# Patient Record
Sex: Male | Born: 1985 | Race: Black or African American | Hispanic: No | Marital: Single | Smoking: Former smoker
Health system: Southern US, Community
[De-identification: ages and names within clinical notes are randomized; demographics above are authoritative.]

## PROBLEM LIST (undated history)

## (undated) DIAGNOSIS — J302 Other seasonal allergic rhinitis: Secondary | ICD-10-CM

## (undated) DIAGNOSIS — D649 Anemia, unspecified: Secondary | ICD-10-CM

## (undated) DIAGNOSIS — B353 Tinea pedis: Secondary | ICD-10-CM

## (undated) DIAGNOSIS — M2550 Pain in unspecified joint: Secondary | ICD-10-CM

## (undated) HISTORY — DX: Tinea pedis: B35.3

## (undated) HISTORY — DX: Anemia, unspecified: D64.9

## (undated) HISTORY — DX: Pain in unspecified joint: M25.50

## (undated) HISTORY — DX: Other seasonal allergic rhinitis: J30.2

---
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
## 2008-11-20 ENCOUNTER — Ambulatory Visit: Payer: Self-pay | Admitting: Internal Medicine

## 2008-11-20 DIAGNOSIS — L4050 Arthropathic psoriasis, unspecified: Secondary | ICD-10-CM | POA: Insufficient documentation

## 2008-11-20 DIAGNOSIS — E7800 Pure hypercholesterolemia, unspecified: Secondary | ICD-10-CM | POA: Insufficient documentation

## 2008-11-20 DIAGNOSIS — R718 Other abnormality of red blood cells: Secondary | ICD-10-CM | POA: Insufficient documentation

## 2008-11-23 ENCOUNTER — Telehealth (INDEPENDENT_AMBULATORY_CARE_PROVIDER_SITE_OTHER): Payer: Self-pay | Admitting: *Deleted

## 2008-12-01 LAB — CONVERTED CEMR LAB
AST: 31 units/L (ref 0–37)
Alkaline Phosphatase: 67 units/L (ref 39–117)
Basophils Relative: 0.4 % (ref 0.0–3.0)
Bilirubin Urine: NEGATIVE
CO2: 30 meq/L (ref 19–32)
Calcium: 9.3 mg/dL (ref 8.4–10.5)
Chloride: 105 meq/L (ref 96–112)
Eosinophils Absolute: 0.2 10*3/uL (ref 0.0–0.7)
Eosinophils Relative: 2.2 % (ref 0.0–5.0)
Glucose, Bld: 100 mg/dL — ABNORMAL HIGH (ref 70–99)
HDL: 31 mg/dL — ABNORMAL LOW (ref >39.00)
Ketones, ur: NEGATIVE mg/dL
Leukocytes, UA: NEGATIVE
Lymphocytes Relative: 26.4 % (ref 12.0–46.0)
Monocytes Absolute: 0.5 10*3/uL (ref 0.1–1.0)
Monocytes Relative: 5.4 % (ref 3.0–12.0)
Neutro Abs: 5.6 10*3/uL (ref 1.4–7.7)
Neutrophils Relative %: 65.6 % (ref 43.0–77.0)
Potassium: 3.8 meq/L (ref 3.5–5.1)
Total CHOL/HDL Ratio: 6
Urine Glucose: NEGATIVE mg/dL
Urobilinogen, UA: 0.2 (ref 0.0–1.0)
VLDL: 21.8 mg/dL (ref 0.0–40.0)
WBC: 8.5 10*3/uL (ref 4.5–10.5)
pH: 6 (ref 5.0–8.0)

## 2009-10-01 ENCOUNTER — Encounter: Payer: Self-pay | Admitting: Adult Health

## 2009-10-01 ENCOUNTER — Ambulatory Visit: Payer: Self-pay | Admitting: Internal Medicine

## 2009-10-01 LAB — CONVERTED CEMR LAB: Anti Nuclear Antibody(ANA): NEGATIVE

## 2009-10-05 DIAGNOSIS — B353 Tinea pedis: Secondary | ICD-10-CM | POA: Insufficient documentation

## 2009-10-06 ENCOUNTER — Telehealth (INDEPENDENT_AMBULATORY_CARE_PROVIDER_SITE_OTHER): Payer: Self-pay | Admitting: *Deleted

## 2009-10-06 LAB — CONVERTED CEMR LAB
Basophils Absolute: 0 10*3/uL (ref 0.0–0.1)
Eosinophils Absolute: 0.2 10*3/uL (ref 0.0–0.7)
HCT: 38.3 % — ABNORMAL LOW (ref 39.0–52.0)
Lymphs Abs: 2.1 10*3/uL (ref 0.7–4.0)
MCHC: 33.3 g/dL (ref 30.0–36.0)
Neutro Abs: 6.6 10*3/uL (ref 1.4–7.7)
RBC: 5.26 M/uL (ref 4.22–5.81)
Rhuematoid fact SerPl-aCnc: 20 intl units/mL (ref 0.0–20.0)

## 2009-10-21 ENCOUNTER — Telehealth: Payer: Self-pay | Admitting: Adult Health

## 2009-10-22 ENCOUNTER — Ambulatory Visit: Payer: Self-pay | Admitting: Internal Medicine

## 2009-10-22 ENCOUNTER — Ambulatory Visit: Payer: Self-pay | Admitting: Adult Health

## 2009-10-22 LAB — CONVERTED CEMR LAB
Folate: 6.8 ng/mL
Iron: 60 ug/dL (ref 42–165)
Saturation Ratios: 16.9 % — ABNORMAL LOW (ref 20.0–50.0)
Transferrin: 253.4 mg/dL (ref 212.0–360.0)
Vitamin B-12: 390 pg/mL (ref 211–911)

## 2009-11-02 ENCOUNTER — Ambulatory Visit: Payer: Self-pay | Admitting: Internal Medicine

## 2009-12-10 LAB — CONVERTED CEMR LAB: OCCULT 2: NEGATIVE

## 2010-01-18 ENCOUNTER — Telehealth: Payer: Self-pay | Admitting: Internal Medicine

## 2010-05-13 ENCOUNTER — Emergency Department (HOSPITAL_COMMUNITY)
Admission: EM | Admit: 2010-05-13 | Discharge: 2010-05-13 | Payer: Self-pay | Source: Home / Self Care | Admitting: Emergency Medicine

## 2010-05-16 ENCOUNTER — Emergency Department (HOSPITAL_COMMUNITY)
Admission: EM | Admit: 2010-05-16 | Discharge: 2010-05-16 | Payer: Self-pay | Source: Home / Self Care | Admitting: Emergency Medicine

## 2010-07-05 NOTE — Progress Notes (Signed)
Summary: lab results  Phone Note Call from Patient Call back at [phone]   Caller: Patient Call For: parrett Summary of Call: Wants lab results. Initial call taken by: Darletta Moll,  Oct 06, 2009 2:55 PM  Follow-up for Phone Call        per 10-06-09 append for 10-01-09 labs, pt aware. Follow-up by: Boone Master CNA,  Oct 06, 2009 3:10 PM

## 2010-07-05 NOTE — Assessment & Plan Note (Signed)
Summary: Acute NP office visit - edema   Primary Provider/Referring Provider:  Sherene Sires  CC:  edema in both feet and and index finger on right hand x3weeks - denies any changes in breathing.  History of Present Illness: 25 yobm with h/o seasonal allergies controlled with otc's just in spring with pollen.  November 20, 2008 for CPX no reg exercise, no limitations, no active allergy complaints. Pt denies any significant sore throat, nasal congestion or excess secretions, fever, chills, sweats, unintended wt loss, pleuritic or exertional cp, orthopnea pnd or leg swelling.    October 01, 2009--Presents for an acute office visit. Complains of edema in both feet, and index finger on right hand x3weeks. Over last 3 weeks noticed cracked skin along feet and toes-esp 2nd right toe. Also pain in feet -plantar surface. Has on/off stiffness swelling in right 2nd finger. No known injury. Denies chest pain, dyspnea, orthopnea, hemoptysis, fever, n/v/d, edema, headache., rash, urinary symptoms, known injury.   Preventive Screening-Counseling & Management  Alcohol-Tobacco     Smoking Status: never  Medications Prior to Update: 1)  None  Current Medications (verified): 1)  Ibuprofen 800 Mg Tabs (Ibuprofen) .... Take 1 Tablet By Mouth Three Times A Day With Food X7days  Allergies (verified): No Known Drug Allergies  Past History:  Past Medical History: Last updated: 11/20/2008 Health Maintenance...........................................Marland KitchenWert     - CPX November 20, 2008  Obesity     - Target wt  =  185   for BMI < 30  Seasonal Rhinitis  Family History: Last updated: 11/20/2008 Father obesity/ hypertension Mother healthy  Social History: Last updated: 11/20/2008 Student at [employer] Never smoked cigs Smokes marijuana 3 times per wk.  Started in 2007. ETOH on the wkends.  Social History: Smoking Status:  never  Review of Systems      See HPI  Vital Signs:  Patient profile:   25 year old  male Height:      67 inches Weight:      211.38 pounds BMI:     33.23 O2 Sat:      99 % on Room air Temp:     98.0 degrees F oral Pulse rate:   60 / minute BP sitting:   132 / 74  (left arm) Cuff size:   regular  Vitals Entered By: Boone Master CNA (October 01, 2009 11:28 AM)  O2 Flow:  Room air CC: edema in both feet, and index finger on right hand x3weeks - denies any changes in breathing Is Patient Diabetic? No Comments Medications reviewed with patient Daytime contact number verified with patient. Boone Master CNA  October 01, 2009 11:27 AM    Physical Exam  Additional Exam:  wt 225  November 20, 2008 >>211 10/01/09 HEENT: nl dentition, turbinates, and orophanx. Nl external ear canals without cough reflex NECK :  without JVD/Nodes/TM/ nl carotid upstrokes bilaterally LUNGS: no acc muscle use, clear to A and P bilaterally without cough on insp or exp maneuvers CV:  RRR  no s3 or murmur or increase in P2, no edema   ABD:  soft and nontender with nl excursion in the supine position. No bruits or organomegaly, bowel sounds nl MS:  warm without deformities, calf tenderness, cyanosis or clubbing, 2nd finger swollen, tender esp at base. no redness, pulses intact.  SKIN: scaly skin on right foot-plantar surface.  NEURO:  alert, approp, no deficits      Impression & Recommendations:  Problem # 1:  ARTHRALGIA (ICD-719.40)  ?etiology ,  2nd right finger pain foot pain ? plantar fascititis  REC : labs pending-arthiritis profile Ibuprofen 200mg  4 tabs three times a day w/ food for 7 days  Plantar fasciititis instruciton exercises.  Please contact office for sooner follow up if symptoms do not improve or worsen   Orders: T-Antinuclear Antib (ANA) ([id number]) TLB-CBC Platelet - w/Differential (85025-CBCD) TLB-Sedimentation Rate (ESR) (85652-ESR) TLB-Rheumatoid Factor (RA) (30865-HQ) TLB-Uric Acid, Blood (84550-URIC) Est. Patient Level IV (46962)  Problem # 2:  TINEA PEDIS  (ICD-110.4) Warm soaks to feet  Dry feet well, change soaks frequently, wear shoes in public showeres Use Over the counter Athletes foot spray cream two times a day until rash is gone.   Medications Added to Medication List This Visit: 1)  Ibuprofen 800 Mg Tabs (Ibuprofen) .... Take 1 tablet by mouth three times a day with food x7days  Complete Medication List: 1)  Ibuprofen 800 Mg Tabs (Ibuprofen) .... Take 1 tablet by mouth three times a day with food x7days  Patient Instructions: 1)  call [phone] w/ lab results.  2)  Warm soaks to feet  3)  Dry feet well, change soaks frequently, wear shoes in public showeres 4)  Plantar fasciititis instruciton exercises.  5)  Use Over the counter Athletes foot spray cream two times a day until rash is gone.  6)  Ibuprofen 200mg  4 tabs three times a day w/ food for 7 days  7)  Please contact office for sooner follow up if symptoms do not improve or worsen  Prescriptions: IBUPROFEN 800 MG TABS (IBUPROFEN) Take 1 tablet by mouth three times a day with food x7days  #21 x 0   Entered by:   Boone Master CNA   Authorized by:   Rubye Oaks NP   Signed by:   Boone Master CNA on 10/01/2009   Method used:   [facility] to        [phone]retail[facility]       [address]       [place], [place]  [address]       Ph: [phone] or [phone]       Fax: [phone]   RxID:   [id number]

## 2010-07-05 NOTE — Assessment & Plan Note (Signed)
Summary: finger still hurts, need rx/klw   Visit Type:  Walk-In Visit Primary Provider/Referring Provider:  Sherene Sires  CC:  Pt c/o right index finger pain and swelling no better from last OV 3 weeks ago. Pt states Ibuprofen did not help with pain. Pt states went down for labs this AM and wanted to be seen by TP before leaving. Pt states he did turn in stool cards to lab this AM.  History of Present Illness: 24 yobm with h/o seasonal allergies controlled with otc's just in spring with pollen.  November 20, 2008 for CPX no reg exercise, no limitations, no active allergy complaints. Pt denies any significant sore throat, nasal congestion or excess secretions, fever, chills, sweats, unintended wt loss, pleuritic or exertional cp, orthopnea pnd or leg swelling.    October 01, 2009--Presents for an acute office visit. Complains of edema in both feet, and index finger on right hand x3weeks. Over last 3 weeks noticed cracked skin along feet and toes-esp 2nd right toe. Also pain in feet -plantar surface. Has on/off stiffness swelling in right 2nd finger. No known injury.    Oct 22, 2009--Returns for persistent swelling in right index finger, painful, can not bend. Was seen 1 month ago, labs were neg for RA factor, ANA and uric acid was nml. He was tx w/ nsaids x 7 days w/ no improvement. ESR was elevated at 48.  He also had some tinea on feet which resolved w/ otc cream/spray. Had some pain in plantar surface of feet that was felt to be plantar fascititis- that is some better. xray today shows soft tissue swelling w/ no fx. He denies any known trauma. 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 He has no urinary symtpoms, abd pian , other joint swelling. Labs did show some mild anmeia which he had labs for today w/ iron panel and stool cards.   Preventive Screening-Counseling & Management  Alcohol-Tobacco     Smoking Status: current     Year Started: 2006  Cigars/week: 2  Current Medications (verified): 1)  Tylenol Extra Strength 500 Mg Tabs (Acetaminophen) .... Take 1 Tablet By Mouth Two Times A Day As Needed For Finger Pain 2)  Multivitamins   Tabs (Multiple Vitamin) .... Take 1 Tablet By Mouth Once A Day  Allergies (verified): No Known Drug Allergies  Past History:  Social History: Last updated: 11/20/2008 Student at [employer] Never smoked cigs Smokes marijuana 3 times per wk.  Started in 2007. ETOH on the wkends.  Risk Factors: Smoking Status: current (10/22/2009) Cigars/wk: 2 (10/22/2009)  Past Medical History: Health Maintenance...........................................Marland KitchenWert     - CPX November 20, 2008  Obesity     - Target wt  =  185   for BMI < 30  Seasonal Rhinitis Joint swelling -right index-neg ANA, RA factor, uric acid , no response w/ nsaids ----referred to ortho Oct 22, 2009   Social History: Cigars/week:  2 Smoking Status:  current  Review of Systems      See HPI  Vital Signs:  Patient profile:   25 year old male Height:      67 inches Weight:      208 pounds O2 Sat:      99 % on Room air Temp:     97.2 degrees F oral Pulse rate:   58 / minute BP sitting:   116 / 70  (left arm) Cuff size:   regular  Vitals Entered By:  Zackery Barefoot CMA (Oct 22, 2009 9:20 AM)  O2 Flow:  Room air CC: Pt c/o right index finger pain and swelling no better from last OV 3 weeks ago. Pt states Ibuprofen did not help with pain. Pt states went down for labs this AM and wanted to be seen by TP before leaving. Pt states he did turn in stool cards to lab this AM Is Patient Diabetic? No Pain Assessment Patient in pain? yes     Location: finger Intensity: 10 Type: dull/achy Comments Medications reviewed with patient Verified contact number and pharmacy with patient Zackery Barefoot CMA  Oct 22, 2009 9:21 AM    Physical Exam  Additional Exam:  wt 225  November 20, 2008 >>211 10/01/09>>208 Oct 22, 2009 11 HEENT: nl dentition,  turbinates, and orophanx. Nl external ear canals without cough reflex NECK :  without JVD/Nodes/TM/ nl carotid upstrokes bilaterally LUNGS: no acc muscle use, clear to A and P bilaterally without cough on insp or exp maneuvers CV:  RRR  no s3 or murmur or increase in P2, no edema   ABD:  soft and nontender with nl excursion in the supine position. No bruits or organomegaly, bowel sounds nl MS:  warm without deformities, calf tenderness, cyanosis or clubbing, 2nd finger swollen, tender esp at base. no redness, pulses intact.  knee, elbow rom nml, no other  notable joint swelling on exam SKIN: feet scaling has resolved. has dry patches on elbows.  NEURO:  alert, approp, no deficits      Impression & Recommendations:  Problem # 1:  ARTHRALGIA (ICD-719.40) ? etiology of finger swelling. no response to Nsaids.  xray w/ no fx.  labs neg RA factor, ANA.  esr sl up c/w inflammation refer to ortho to evaluate.  steroid taper over next week.   Orders: T-Hand Right 3 views (73130TC) Orthopedic Referral (Ortho) Est. Patient Level IV (72536)  Problem # 2:  TINEA PEDIS (ICD-110.4)  resolved w/ otc.   Orders: Est. Patient Level IV (64403)  Problem # 3:  ANEMIA (ICD-285.9)  mild anemia iron panel/stool cards pending.   Orders: Est. Patient Level IV (47425)  Medications Added to Medication List This Visit: 1)  Tylenol Extra Strength 500 Mg Tabs (Acetaminophen) .... Take 1 tablet by mouth two times a day as needed for finger pain 2)  Multivitamins Tabs (Multiple vitamin) .... Take 1 tablet by mouth once a day 3)  Prednisone 10 Mg Tabs (Prednisone) .... 4 tabs for 2 days, then 3 tabs for 2 days, 2 tabs for 2 days, then 1 tab for 2 days, then stop  Complete Medication List: 1)  Tylenol Extra Strength 500 Mg Tabs (Acetaminophen) .... Take 1 tablet by mouth two times a day as needed for finger pain 2)  Multivitamins Tabs (Multiple vitamin) .... Take 1 tablet by mouth once a day 3)   Prednisone 10 Mg Tabs (Prednisone) .... 4 tabs for 2 days, then 3 tabs for 2 days, 2 tabs for 2 days, then 1 tab for 2 days, then stop  Patient Instructions: 1)  Prednisone taper over next week.  2)  We are referring you to ortho to evaluate finger swelling/pain. 3)  Please contact office for sooner follow up if symptoms do not improve or worsen  Prescriptions: PREDNISONE 10 MG TABS (PREDNISONE) 4 tabs for 2 days, then 3 tabs for 2 days, 2 tabs for 2 days, then 1 tab for 2 days, then stop  #20 x 0   Entered and  Authorized by:   Rubye Oaks NP   Signed by:   Rubye Oaks NP on 10/22/2009   Method used:   [facility] to        [phone]       [address]       [place], [place]  [address]       Ph: [phone] or [phone]       Fax: [phone]   RxID:   [id number]

## 2010-07-05 NOTE — Progress Notes (Signed)
Summary: nos appt  Phone Note Call from Patient   Caller: [email or web address]  Call For: wert Summary of Call: LMTCB x2 to rsc nos from 8/15. Initial call taken by: Darletta Moll,  January 18, 2010 4:21 PM

## 2010-07-05 NOTE — Progress Notes (Signed)
Summary: pt did not return for labs  ---- Converted from flag ---- ---- 10/21/2009 2:06 PM, Boone Master CNA/MA wrote: LMOM TCB.  ---- 10/19/2009 9:26 AM, Rubye Oaks NP wrote: he did not return for labs ------------------------------  Phone Note Outgoing Call Call back at [facility] Phone [phone]   Call placed by: Boone Master CNA/MA,  Oct 21, 2009 3:41 PM Call placed to: Patient Summary of Call: ---- 10/19/2009 9:26 AM, Rubye Oaks NP wrote: he did not return for labs  ---- 10/21/2009 2:06 PM, Boone Master CNA/MA wrote: LMOM TCB.  per append to 10-01-09 labs, pt was supposed to return for iron panel, b12/folate and return stool cards that were mailed to him. Initial call taken by: Boone Master CNA/MA,  Oct 21, 2009 3:41 PM

## 2010-08-16 LAB — URINALYSIS, ROUTINE W REFLEX MICROSCOPIC
Bilirubin Urine: NEGATIVE
Glucose, UA: NEGATIVE mg/dL
Leukocytes, UA: NEGATIVE
Leukocytes, UA: NEGATIVE
Nitrite: NEGATIVE
Nitrite: NEGATIVE
Protein, ur: NEGATIVE mg/dL
Protein, ur: NEGATIVE mg/dL
Specific Gravity, Urine: 1.024 (ref 1.005–1.030)
pH: 6 (ref 5.0–8.0)
pH: 6 (ref 5.0–8.0)

## 2010-08-16 LAB — URINE MICROSCOPIC-ADD ON

## 2010-10-17 ENCOUNTER — Ambulatory Visit: Payer: Self-pay | Admitting: Internal Medicine

## 2010-10-18 ENCOUNTER — Ambulatory Visit: Payer: Self-pay | Admitting: Internal Medicine

## 2012-05-24 ENCOUNTER — Ambulatory Visit: Payer: Self-pay | Admitting: Internal Medicine

## 2012-06-12 ENCOUNTER — Other Ambulatory Visit (INDEPENDENT_AMBULATORY_CARE_PROVIDER_SITE_OTHER): Payer: BC Managed Care – PPO

## 2012-06-12 ENCOUNTER — Ambulatory Visit (INDEPENDENT_AMBULATORY_CARE_PROVIDER_SITE_OTHER): Payer: BC Managed Care – PPO | Admitting: Internal Medicine

## 2012-06-12 ENCOUNTER — Ambulatory Visit (INDEPENDENT_AMBULATORY_CARE_PROVIDER_SITE_OTHER)
Admission: RE | Admit: 2012-06-12 | Discharge: 2012-06-12 | Disposition: A | Discharge status: Home / Self Care | Payer: BC Managed Care – PPO | Source: Ambulatory Visit | Attending: Internal Medicine | Admitting: Internal Medicine

## 2012-06-12 ENCOUNTER — Encounter: Payer: Self-pay | Admitting: Internal Medicine

## 2012-06-12 VITALS — BP 138/80 | HR 73 | Temp 97.9°F | Ht 67.0 in | Wt 214.0 lb

## 2012-06-12 DIAGNOSIS — Z0000 Encounter for general adult medical examination without abnormal findings: Secondary | ICD-10-CM

## 2012-06-12 DIAGNOSIS — D649 Anemia, unspecified: Secondary | ICD-10-CM

## 2012-06-12 DIAGNOSIS — Z23 Encounter for immunization: Secondary | ICD-10-CM

## 2012-06-12 DIAGNOSIS — E7800 Pure hypercholesterolemia, unspecified: Secondary | ICD-10-CM

## 2012-06-12 DIAGNOSIS — L4050 Arthropathic psoriasis, unspecified: Secondary | ICD-10-CM

## 2012-06-12 LAB — HEPATIC FUNCTION PANEL
Albumin: 4 g/dL (ref 3.5–5.2)
Bilirubin, Direct: 0.1 mg/dL (ref 0.0–0.3)

## 2012-06-12 LAB — URINALYSIS, ROUTINE W REFLEX MICROSCOPIC
Bilirubin Urine: NEGATIVE
Nitrite: NEGATIVE
Total Protein, Urine: NEGATIVE

## 2012-06-12 LAB — BASIC METABOLIC PANEL
BUN: 11 mg/dL (ref 6–23)
Calcium: 9.2 mg/dL (ref 8.4–10.5)
Creatinine, Ser: 1 mg/dL (ref 0.4–1.5)
Glucose, Bld: 90 mg/dL (ref 70–99)
Potassium: 4 mEq/L (ref 3.5–5.1)

## 2012-06-12 LAB — LIPID PANEL
Triglycerides: 63 mg/dL (ref 0.0–149.0)
VLDL: 12.6 mg/dL (ref 0.0–40.0)

## 2012-06-12 LAB — TSH: TSH: 0.55 u[IU]/mL (ref 0.35–5.50)

## 2012-06-12 NOTE — Patient Instructions (Signed)
Have Dr Dareen Piano fax me the next office visit to summarize your care to date   Please remember to go to the lab and x-ray department downstairs for your tests - we will call you with the results when they are available.  Weight control is simply a matter of calorie balance which needs to be tilted in your favor by eating less and exercising more.  To get the most out of exercise, you need to be continuously aware that you are short of breath, but never out of breath, for 30 minutes daily. As you improve, it will actually be easier for you to do the same amount of exercise  in  30 minutes so always push to the level where you are short of breath.    We gave you   tetanus and pneumovax today but you need to take the flu shot when / where available

## 2012-06-12 NOTE — Progress Notes (Signed)
Subjective:     Patient ID: Quinlan Vollmer II, male   DOB: 08/16/1985   MRN: [id number]  HPI    26 yobm with h/o seasonal allergies controlled with otc's just in spring with pollen dx'd with  psoriatic arthritis  November 20, 2008 for CPX no reg exercise, no limitations, no active allergy complaints    October 01, 2009-- dx of psoriatic arthritis by Dareen Piano.  06/12/12 CPX/ Aurea Aronov, good control from remicade, shows very poor insight however into his dx and rx. No cough or sob.  ROS  The following are not active complaints unless bolded sore throat, dysphagia, dental problems, itching, sneezing,  nasal congestion or excess/ purulent secretions, ear ache,   fever, chills, sweats, unintended wt loss, pleuritic or exertional cp, hemoptysis,  orthopnea pnd or leg swelling, presyncope, palpitations, heartburn, abdominal pain, anorexia, nausea, vomiting, diarrhea  or change in bowel or urinary habits, change in stools or urine, dysuria,hematuria,  rash arthralgias much better p rx remicade, visual complaints, headache, numbness weakness or ataxia or problems with walking or coordination,  change in mood/affect or memory.        Past History:  Psoriatic arthritis ....................................................Marland Kitchen   [place] Obesity  - Target wt = 185 for BMI < 30  Seasonal Rhinitis  Health maint ............................................................... 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 Started in 2007.  ETOH on the wkends.        Social History:  Cigars/week: 2  Smoking Status: current                Review of Systems     Objective:   Physical Exam    Exam: wt 225 November 20, 2008 >>211 10/01/09>>208 Oct 22, 2009 11 > 214 06/12/2012  HEENT: nl dentition, turbinates, and orophanx. Nl external ear canals without cough reflex  NECK : without JVD/Nodes/TM/ nl carotid  upstrokes bilaterally  LUNGS: no acc muscle use, clear to A and P bilaterally without cough on insp or exp maneuvers  CV: RRR no s3 or murmur or increase in P2, no edema  ABD: soft and nontender with nl excursion in the supine position. No bruits or organomegaly, bowel sounds nl  MS: warm without deformities, calf tenderness, cyanosis or clubbing, 2nd finger swollen, tender esp at base. no redness, pulses intact.  knee, elbow rom nml, no other notable joint swelling on exam  SKIN: feet scaling has resolved. has dry patches on elbows.  NEURO: alert, approp, no deficits   CXR  06/12/2012 : Normal chest    Assessment:          Plan:

## 2012-06-14 ENCOUNTER — Telehealth: Payer: Self-pay | Admitting: Internal Medicine

## 2012-06-14 NOTE — Telephone Encounter (Signed)
Result Note     Call pt: Reviewed cxr and no acute change so no change in recommendations made at ov   Result Note     Call patient : Studies are unremarkable, no change in recs   --  lmomtcb x1

## 2012-06-14 NOTE — Progress Notes (Signed)
Quick Note:  lmomtcb on pt's cell and home #s ______

## 2012-06-14 NOTE — Progress Notes (Signed)
Quick Note:  lmomtcb on pt's cell and home #s ______ 

## 2012-06-16 ENCOUNTER — Encounter: Payer: Self-pay | Admitting: Internal Medicine

## 2012-06-16 NOTE — Assessment & Plan Note (Signed)
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

## 2012-06-16 NOTE — Assessment & Plan Note (Signed)
Not ideal but likely easily corrected with diet/ exercise

## 2012-06-16 NOTE — Assessment & Plan Note (Signed)
Discussed importance of understanding his meds since they do suppress his immune system  Given tdap and pneumovax today

## 2012-06-17 NOTE — Telephone Encounter (Signed)
lmomtcb  

## 2012-06-17 NOTE — Progress Notes (Signed)
Quick Note:  Spoke with pt and notified of results per Dr. Wert. Pt verbalized understanding and denied any questions.  ______ 

## 2012-06-17 NOTE — Telephone Encounter (Signed)
Spoke with pt and notified of results per Dr. Wert. Pt verbalized understanding and denied any questions. 

## 2012-06-18 ENCOUNTER — Ambulatory Visit (INDEPENDENT_AMBULATORY_CARE_PROVIDER_SITE_OTHER): Payer: BC Managed Care – PPO | Admitting: Professional

## 2012-06-18 DIAGNOSIS — F411 Generalized anxiety disorder: Secondary | ICD-10-CM

## 2012-10-30 ENCOUNTER — Emergency Department (HOSPITAL_COMMUNITY): Payer: BC Managed Care – PPO

## 2012-10-30 ENCOUNTER — Encounter (HOSPITAL_COMMUNITY): Payer: Self-pay | Admitting: *Deleted

## 2012-10-30 ENCOUNTER — Emergency Department (HOSPITAL_COMMUNITY)
Admission: EM | Admit: 2012-10-30 | Discharge: 2012-10-30 | Disposition: A | Discharge status: Home / Self Care | Payer: BC Managed Care – PPO | Attending: Emergency Medicine | Admitting: Emergency Medicine

## 2012-10-30 DIAGNOSIS — Y92410 Unspecified street and highway as the place of occurrence of the external cause: Secondary | ICD-10-CM | POA: Insufficient documentation

## 2012-10-30 DIAGNOSIS — Z862 Personal history of diseases of the blood and blood-forming organs and certain disorders involving the immune mechanism: Secondary | ICD-10-CM | POA: Insufficient documentation

## 2012-10-30 DIAGNOSIS — S161XXA Strain of muscle, fascia and tendon at neck level, initial encounter: Secondary | ICD-10-CM

## 2012-10-30 DIAGNOSIS — Z8619 Personal history of other infectious and parasitic diseases: Secondary | ICD-10-CM | POA: Insufficient documentation

## 2012-10-30 DIAGNOSIS — T148XXA Other injury of unspecified body region, initial encounter: Secondary | ICD-10-CM

## 2012-10-30 DIAGNOSIS — S39012A Strain of muscle, fascia and tendon of lower back, initial encounter: Secondary | ICD-10-CM

## 2012-10-30 DIAGNOSIS — S0990XA Unspecified injury of head, initial encounter: Secondary | ICD-10-CM | POA: Insufficient documentation

## 2012-10-30 DIAGNOSIS — S59909A Unspecified injury of unspecified elbow, initial encounter: Secondary | ICD-10-CM | POA: Insufficient documentation

## 2012-10-30 DIAGNOSIS — Z87891 Personal history of nicotine dependence: Secondary | ICD-10-CM | POA: Insufficient documentation

## 2012-10-30 DIAGNOSIS — J309 Allergic rhinitis, unspecified: Secondary | ICD-10-CM | POA: Insufficient documentation

## 2012-10-30 DIAGNOSIS — S6990XA Unspecified injury of unspecified wrist, hand and finger(s), initial encounter: Secondary | ICD-10-CM | POA: Insufficient documentation

## 2012-10-30 DIAGNOSIS — S4980XA Other specified injuries of shoulder and upper arm, unspecified arm, initial encounter: Secondary | ICD-10-CM | POA: Insufficient documentation

## 2012-10-30 DIAGNOSIS — Z79899 Other long term (current) drug therapy: Secondary | ICD-10-CM | POA: Insufficient documentation

## 2012-10-30 DIAGNOSIS — S46909A Unspecified injury of unspecified muscle, fascia and tendon at shoulder and upper arm level, unspecified arm, initial encounter: Secondary | ICD-10-CM | POA: Insufficient documentation

## 2012-10-30 DIAGNOSIS — S335XXA Sprain of ligaments of lumbar spine, initial encounter: Secondary | ICD-10-CM | POA: Insufficient documentation

## 2012-10-30 DIAGNOSIS — Y9389 Activity, other specified: Secondary | ICD-10-CM | POA: Insufficient documentation

## 2012-10-30 DIAGNOSIS — S139XXA Sprain of joints and ligaments of unspecified parts of neck, initial encounter: Secondary | ICD-10-CM | POA: Insufficient documentation

## 2012-10-30 MED ORDER — IBUPROFEN 800 MG PO TABS: 800.0000 mg | ORAL_TABLET | Freq: Three times a day (TID) | ORAL | Status: DC

## 2012-10-30 MED ORDER — HYDROCODONE-ACETAMINOPHEN 5-325 MG PO TABS: 2.0000 | ORAL_TABLET | Freq: Four times a day (QID) | ORAL | Status: DC | PRN

## 2012-10-30 MED ORDER — METHOCARBAMOL 500 MG PO TABS: 500.0000 mg | ORAL_TABLET | Freq: Two times a day (BID) | ORAL | Status: DC
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
## 2012-10-30 NOTE — ED Provider Notes (Signed)
Medical screening examination/treatment/procedure(s) were performed by non-physician practitioner and as supervising physician I was immediately available for consultation/collaboration.  Juliet Rude. Rubin Payor, MD 10/30/12 2324
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
## 2013-04-04 ENCOUNTER — Other Ambulatory Visit (HOSPITAL_COMMUNITY): Payer: Self-pay | Admitting: *Deleted

## 2013-04-07 ENCOUNTER — Encounter (HOSPITAL_COMMUNITY)
Admission: RE | Admit: 2013-04-07 | Discharge: 2013-04-07 | Disposition: A | Discharge status: Home / Self Care | Payer: BC Managed Care – PPO | Source: Ambulatory Visit | Attending: Rheumatology | Admitting: Rheumatology

## 2013-04-07 DIAGNOSIS — L4050 Arthropathic psoriasis, unspecified: Secondary | ICD-10-CM | POA: Insufficient documentation

## 2013-04-07 MED ORDER — SODIUM CHLORIDE 0.9 % IV SOLN
INTRAVENOUS | Status: DC
  Administered 2013-04-07: 10:00:00 via INTRAVENOUS

## 2013-04-07 MED ORDER — SODIUM CHLORIDE 0.9 % IV SOLN
5.0000 mg/kg | INTRAVENOUS | Status: DC
  Administered 2013-04-07: 500 mg via INTRAVENOUS
  Filled 2013-04-07: qty 50

## 2013-04-07 MED ORDER — ACETAMINOPHEN 325 MG PO TABS
650.0000 mg | ORAL_TABLET | Freq: Four times a day (QID) | ORAL | Status: DC | PRN
  Administered 2013-04-07: 650 mg via ORAL

## 2013-04-07 MED ORDER — ACETAMINOPHEN 325 MG PO TABS
ORAL_TABLET | ORAL | Status: AC
  Filled 2013-04-07: qty 2

## 2013-04-21 ENCOUNTER — Encounter (HOSPITAL_COMMUNITY): Payer: BC Managed Care – PPO

## 2013-04-22 ENCOUNTER — Encounter (HOSPITAL_COMMUNITY)
Admission: RE | Admit: 2013-04-22 | Discharge: 2013-04-22 | Disposition: A | Discharge status: Home / Self Care | Payer: BC Managed Care – PPO | Source: Ambulatory Visit | Attending: Rheumatology | Admitting: Rheumatology

## 2013-04-22 MED ORDER — ACETAMINOPHEN 325 MG PO TABS
650.0000 mg | ORAL_TABLET | Freq: Four times a day (QID) | ORAL | Status: DC | PRN
  Administered 2013-04-22: 650 mg via ORAL

## 2013-04-22 MED ORDER — ACETAMINOPHEN 325 MG PO TABS
ORAL_TABLET | ORAL | Status: AC
  Filled 2013-04-22: qty 2

## 2013-04-22 MED ORDER — SODIUM CHLORIDE 0.9 % IV SOLN
5.0000 mg/kg | INTRAVENOUS | Status: DC
  Administered 2013-04-22: 500 mg via INTRAVENOUS
  Filled 2013-04-22: qty 50

## 2013-04-22 MED ORDER — SODIUM CHLORIDE 0.9 % IV SOLN
INTRAVENOUS | Status: DC
  Administered 2013-04-22: 08:00:00 via INTRAVENOUS

## 2013-04-23 ENCOUNTER — Encounter (HOSPITAL_COMMUNITY): Payer: BC Managed Care – PPO

## 2013-05-19 ENCOUNTER — Encounter (HOSPITAL_COMMUNITY): Payer: BC Managed Care – PPO

## 2013-05-27 ENCOUNTER — Encounter (HOSPITAL_COMMUNITY)
Admission: RE | Admit: 2013-05-27 | Discharge: 2013-05-27 | Disposition: A | Discharge status: Home / Self Care | Payer: BC Managed Care – PPO | Source: Ambulatory Visit | Attending: Rheumatology | Admitting: Rheumatology

## 2013-05-27 DIAGNOSIS — L4050 Arthropathic psoriasis, unspecified: Secondary | ICD-10-CM | POA: Insufficient documentation

## 2013-05-27 MED ORDER — ACETAMINOPHEN 325 MG PO TABS: 650.0000 mg | ORAL_TABLET | Freq: Four times a day (QID) | ORAL | Status: DC | PRN

## 2013-05-27 MED ORDER — SODIUM CHLORIDE 0.9 % IV SOLN
INTRAVENOUS | Status: DC
  Administered 2013-05-27: 09:00:00 via INTRAVENOUS

## 2013-05-27 MED ORDER — SODIUM CHLORIDE 0.9 % IV SOLN
5.0000 mg/kg | INTRAVENOUS | Status: DC
  Administered 2013-05-27: 500 mg via INTRAVENOUS
  Filled 2013-05-27: qty 50

## 2013-05-27 MED ORDER — ACETAMINOPHEN 325 MG PO TABS
ORAL_TABLET | ORAL | Status: AC
  Administered 2013-05-27: 09:00:00 650 mg
  Filled 2013-05-27: qty 2

## 2013-06-25 ENCOUNTER — Ambulatory Visit (INDEPENDENT_AMBULATORY_CARE_PROVIDER_SITE_OTHER): Payer: BC Managed Care – PPO | Admitting: Internal Medicine

## 2013-06-25 ENCOUNTER — Encounter: Payer: BC Managed Care – PPO | Admitting: Emergency Medicine

## 2013-06-25 ENCOUNTER — Encounter: Payer: Self-pay | Admitting: Internal Medicine

## 2013-06-25 ENCOUNTER — Other Ambulatory Visit (INDEPENDENT_AMBULATORY_CARE_PROVIDER_SITE_OTHER): Payer: BC Managed Care – PPO

## 2013-06-25 ENCOUNTER — Encounter: Payer: Self-pay | Admitting: Emergency Medicine

## 2013-06-25 VITALS — BP 124/80 | HR 57 | Temp 98.0°F | Wt 228.0 lb

## 2013-06-25 DIAGNOSIS — J069 Acute upper respiratory infection, unspecified: Secondary | ICD-10-CM | POA: Insufficient documentation

## 2013-06-25 DIAGNOSIS — R718 Other abnormality of red blood cells: Secondary | ICD-10-CM

## 2013-06-25 LAB — CBC WITH DIFFERENTIAL/PLATELET
BASOS PCT: 0.3 % (ref 0.0–3.0)
Basophils Absolute: 0 10*3/uL (ref 0.0–0.1)
Eosinophils Absolute: 0.2 10*3/uL (ref 0.0–0.7)
Eosinophils Relative: 1.6 % (ref 0.0–5.0)
HCT: 42.8 % (ref 39.0–52.0)
Hemoglobin: 14.6 g/dL (ref 13.0–17.0)
Lymphocytes Relative: 30.5 % (ref 12.0–46.0)
Lymphs Abs: 3.1 10*3/uL (ref 0.7–4.0)
MCHC: 34.2 g/dL (ref 30.0–36.0)
MCV: 71.8 fl — AB (ref 78.0–100.0)
MONO ABS: 0.5 10*3/uL (ref 0.1–1.0)
Monocytes Relative: 5 % (ref 3.0–12.0)
NEUTROS PCT: 62.6 % (ref 43.0–77.0)
Neutro Abs: 6.4 10*3/uL (ref 1.4–7.7)
PLATELETS: 349 10*3/uL (ref 150.0–400.0)
RBC: 5.96 Mil/uL — AB (ref 4.22–5.81)
RDW: 14.6 % (ref 11.5–14.6)
WBC: 10.2 10*3/uL (ref 4.5–10.5)

## 2013-06-25 MED ORDER — AZITHROMYCIN 250 MG PO TABS: ORAL_TABLET | ORAL | Status: DC

## 2013-06-25 NOTE — Progress Notes (Signed)
Quick Note:  Spoke with pt and notified of results per Dr. Wert. Pt verbalized understanding and denied any questions.  ______ 

## 2013-06-25 NOTE — Patient Instructions (Signed)
zpak x one   Fluids/ rest / tylenol  Please remember to go to the lab   department downstairs for your tests - we will call you with the results when they are available.

## 2013-06-25 NOTE — Progress Notes (Signed)
d Subjective:     Patient ID: Darren Ramirez, male   DOB: 02-24-1986   MRN: [id number]  HPI    26 yobm with h/o seasonal allergies controlled with otc's just in spring with pollen dx'd with  psoriatic arthritis  November 20, 2008 for CPX no reg exercise, no limitations, no active allergy complaints    October 01, 2009-- dx of psoriatic arthritis by [name].  06/12/12 CPX/ [name], good control from remicade, shows very poor insight however into his dx and rx. No cough or sob. rec    06/25/2013 acute  ov/Tibor Lemmons re:  Chief Complaint  Patient presents with  . Acute Visit    Pt fatigue and aches since this am   sev days sensation of pnds / discolored mucus / sore throat  Min fatigue s myalgias/arthralgias    ROS  The following are not active complaints unless bolded sore throat, dysphagia, dental problems, itching, sneezing,  nasal congestion or excess/ purulent secretions, ear ache,   fever, chills, sweats, unintended wt loss, pleuritic or exertional cp, hemoptysis,  orthopnea pnd or leg swelling, presyncope, palpitations, heartburn, abdominal pain, anorexia, nausea, vomiting, diarrhea  or change in bowel or urinary habits, change in stools or urine, dysuria,hematuria,  rash arthralgias much better p rx remicade, visual complaints, headache, numbness weakness or ataxia or problems with walking or coordination,  change in mood/affect or memory.        Past History:  Psoriatic arthritis ....................................................Marland Kitchen.   Anderson Obesity  - Target wt = 185 for BMI < 30  Seasonal Rhinitis  Health maint ............................................................... 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 Started in 2007.  ETOH on the wkends.        Social History:  Cigars/week: 2  Smoking Status: current                      Objective:   Physical Exam   Exam: wt 225 November 20, 2008 >>211 10/01/09>>208 Oct 22, 2009 11 > 214 06/12/2012 > 06/25/2013 228 HEENT: nl dentition, turbinates, and orophanx. Nl external ear canals without cough reflex  NECK : without JVD/Nodes/TM/ nl carotid upstrokes bilaterally  LUNGS: no acc muscle use, clear to A and P bilaterally without cough on insp or exp maneuvers  CV: RRR no s3 or murmur or increase in P2, no edema  ABD: soft and nontender with nl excursion in the supine position. No bruits or organomegaly, bowel sounds nl  MS: warm without deformities, calf tenderness, cyanosis or clubbing, 2nd finger swollen, tender esp at base. no redness, pulses intact.  knee, elbow rom nml, no other notable joint swelling on exam  SKIN: feet scaling has resolved. has dry patches on elbows.  NEURO: alert, approp, no deficits    06/25/2013 lab   Chronic microcytic changes/ nl wbc and diff   Assessment:

## 2013-06-29 NOTE — Assessment & Plan Note (Signed)
No evidence of bacterial infection or Influenza at this point - natural hx of viral uri's reviewed with pt.  See instructions for specific recommendations which were reviewed directly with the patient who was given a copy with highlighter outlining the key components.

## 2013-07-10 ENCOUNTER — Encounter (HOSPITAL_COMMUNITY): Payer: BC Managed Care – PPO

## 2013-07-18 ENCOUNTER — Encounter (HOSPITAL_COMMUNITY)
Admission: RE | Admit: 2013-07-18 | Discharge: 2013-07-18 | Disposition: A | Discharge status: Home / Self Care | Payer: BC Managed Care – PPO | Source: Ambulatory Visit | Attending: Rheumatology | Admitting: Rheumatology

## 2013-07-18 DIAGNOSIS — L4050 Arthropathic psoriasis, unspecified: Secondary | ICD-10-CM | POA: Insufficient documentation

## 2013-07-18 MED ORDER — SODIUM CHLORIDE 0.9 % IV SOLN
5.0000 mg/kg | Freq: Once | INTRAVENOUS | Status: AC
  Administered 2013-07-18: 500 mg via INTRAVENOUS
  Filled 2013-07-18: qty 50

## 2013-07-18 MED ORDER — ACETAMINOPHEN 325 MG PO TABS
650.0000 mg | ORAL_TABLET | Freq: Once | ORAL | Status: AC
  Administered 2013-07-18: 650 mg via ORAL

## 2013-07-18 MED ORDER — LORATADINE 10 MG PO TABS: 10.0000 mg | ORAL_TABLET | Freq: Once | ORAL | Status: DC

## 2013-07-18 MED ORDER — ACETAMINOPHEN 325 MG PO TABS
ORAL_TABLET | ORAL | Status: AC
  Filled 2013-07-18: qty 2

## 2013-07-18 MED ORDER — LORATADINE 10 MG PO TABS
ORAL_TABLET | ORAL | Status: AC
  Administered 2013-07-18: 09:00:00 10 mg
  Filled 2013-07-18: qty 1

## 2013-08-11 NOTE — Progress Notes (Signed)
This encounter was created in error - please disregard.

## 2013-09-04 ENCOUNTER — Other Ambulatory Visit (HOSPITAL_COMMUNITY): Payer: Self-pay | Admitting: *Deleted

## 2013-09-05 ENCOUNTER — Encounter (HOSPITAL_COMMUNITY)
Admission: RE | Admit: 2013-09-05 | Discharge: 2013-09-05 | Disposition: A | Discharge status: Home / Self Care | Payer: Self-pay | Source: Ambulatory Visit | Attending: Rheumatology | Admitting: Rheumatology

## 2013-09-05 DIAGNOSIS — L4050 Arthropathic psoriasis, unspecified: Secondary | ICD-10-CM | POA: Insufficient documentation

## 2013-09-05 MED ORDER — SODIUM CHLORIDE 0.9 % IV SOLN
INTRAVENOUS | Status: DC
  Administered 2013-09-05: 10:00:00 via INTRAVENOUS
  Administered 2013-09-05: 250 mL via INTRAVENOUS

## 2013-09-05 MED ORDER — ACETAMINOPHEN 325 MG PO TABS
ORAL_TABLET | ORAL | Status: AC
  Filled 2013-09-05: qty 2

## 2013-09-05 MED ORDER — SODIUM CHLORIDE 0.9 % IV SOLN
5.0000 mg/kg | INTRAVENOUS | Status: DC
  Administered 2013-09-05: 10:00:00 500 mg via INTRAVENOUS
  Filled 2013-09-05: qty 50

## 2013-09-05 MED ORDER — ACETAMINOPHEN 325 MG PO TABS
650.0000 mg | ORAL_TABLET | ORAL | Status: DC
  Administered 2013-09-05: 10:00:00 650 mg via ORAL

## 2013-10-22 ENCOUNTER — Other Ambulatory Visit (HOSPITAL_COMMUNITY): Payer: Self-pay | Admitting: *Deleted

## 2013-10-23 ENCOUNTER — Other Ambulatory Visit (HOSPITAL_COMMUNITY): Payer: Self-pay

## 2013-10-24 ENCOUNTER — Inpatient Hospital Stay (HOSPITAL_COMMUNITY)
Admission: RE | Admit: 2013-10-24 | Discharge: 2013-10-24 | Disposition: A | Discharge status: Home / Self Care | Payer: Self-pay | Source: Ambulatory Visit | Attending: Rheumatology | Admitting: Rheumatology

## 2013-12-17 ENCOUNTER — Encounter (HOSPITAL_COMMUNITY)
Admission: RE | Admit: 2013-12-17 | Discharge: 2013-12-17 | Disposition: A | Discharge status: Home / Self Care | Payer: Self-pay | Source: Ambulatory Visit | Attending: Rheumatology | Admitting: Rheumatology

## 2013-12-17 DIAGNOSIS — L4050 Arthropathic psoriasis, unspecified: Secondary | ICD-10-CM | POA: Insufficient documentation

## 2013-12-17 MED ORDER — ACETAMINOPHEN 325 MG PO TABS
ORAL_TABLET | ORAL | Status: AC
  Administered 2013-12-17: 09:00:00 650 mg via ORAL
  Filled 2013-12-17: qty 2

## 2013-12-17 MED ORDER — ACETAMINOPHEN 325 MG PO TABS
650.0000 mg | ORAL_TABLET | Freq: Once | ORAL | Status: AC
  Administered 2013-12-17: 650 mg via ORAL

## 2013-12-17 MED ORDER — SODIUM CHLORIDE 0.9 % IV SOLN
5.0000 mg/kg | INTRAVENOUS | Status: AC
Start: 2013-12-17 — End: 2013-12-17
  Administered 2013-12-17: 500 mg via INTRAVENOUS
  Filled 2013-12-17: qty 50

## 2013-12-17 MED ORDER — SODIUM CHLORIDE 0.9 % IV SOLN
Freq: Once | INTRAVENOUS | Status: AC
  Administered 2013-12-17: 09:00:00 via INTRAVENOUS

## 2014-02-04 ENCOUNTER — Encounter (HOSPITAL_COMMUNITY): Payer: Self-pay

## 2014-03-16 ENCOUNTER — Other Ambulatory Visit (HOSPITAL_COMMUNITY): Payer: Self-pay | Admitting: *Deleted

## 2014-03-17 ENCOUNTER — Encounter (HOSPITAL_COMMUNITY)
Admission: RE | Admit: 2014-03-17 | Discharge: 2014-03-17 | Disposition: A | Discharge status: Home / Self Care | Payer: Self-pay | Source: Ambulatory Visit | Attending: Rheumatology | Admitting: Rheumatology

## 2014-03-17 DIAGNOSIS — Z5181 Encounter for therapeutic drug level monitoring: Secondary | ICD-10-CM | POA: Insufficient documentation

## 2014-03-17 DIAGNOSIS — L4050 Arthropathic psoriasis, unspecified: Secondary | ICD-10-CM | POA: Insufficient documentation

## 2014-03-17 MED ORDER — ACETAMINOPHEN 325 MG PO TABS
650.0000 mg | ORAL_TABLET | ORAL | Status: DC
  Administered 2014-03-17: 09:00:00 650 mg via ORAL

## 2014-03-17 MED ORDER — ACETAMINOPHEN 325 MG PO TABS
ORAL_TABLET | ORAL | Status: AC
  Filled 2014-03-17: qty 2

## 2014-03-17 MED ORDER — SODIUM CHLORIDE 0.9 % IV SOLN
INTRAVENOUS | Status: DC
  Administered 2014-03-17: 09:00:00 via INTRAVENOUS

## 2014-03-17 MED ORDER — SODIUM CHLORIDE 0.9 % IV SOLN
5.0000 mg/kg | INTRAVENOUS | Status: DC
  Administered 2014-03-17: 09:00:00 500 mg via INTRAVENOUS
  Filled 2014-03-17: qty 50

## 2014-05-05 ENCOUNTER — Encounter (HOSPITAL_COMMUNITY)
Admission: RE | Admit: 2014-05-05 | Discharge: 2014-05-05 | Disposition: A | Discharge status: Home / Self Care | Payer: Self-pay | Source: Ambulatory Visit | Attending: Rheumatology | Admitting: Rheumatology

## 2014-05-05 DIAGNOSIS — L4050 Arthropathic psoriasis, unspecified: Secondary | ICD-10-CM | POA: Insufficient documentation

## 2014-05-05 DIAGNOSIS — Z5181 Encounter for therapeutic drug level monitoring: Secondary | ICD-10-CM | POA: Insufficient documentation

## 2014-05-05 MED ORDER — SODIUM CHLORIDE 0.9 % IV SOLN
5.0000 mg/kg | INTRAVENOUS | Status: DC
  Administered 2014-05-05: 500 mg via INTRAVENOUS
  Filled 2014-05-05: qty 50

## 2014-05-05 MED ORDER — SODIUM CHLORIDE 0.9 % IV SOLN
INTRAVENOUS | Status: DC
  Administered 2014-05-05: 250 mL via INTRAVENOUS

## 2014-05-05 MED ORDER — ACETAMINOPHEN 325 MG PO TABS
ORAL_TABLET | ORAL | Status: AC
  Filled 2014-05-05: qty 2

## 2014-05-05 MED ORDER — ACETAMINOPHEN 325 MG PO TABS
650.0000 mg | ORAL_TABLET | ORAL | Status: DC
  Administered 2014-05-05: 650 mg via ORAL

## 2014-06-19 ENCOUNTER — Encounter (HOSPITAL_COMMUNITY): Payer: Self-pay

## 2014-06-23 ENCOUNTER — Encounter (HOSPITAL_COMMUNITY): Payer: Self-pay

## 2014-07-10 ENCOUNTER — Other Ambulatory Visit (HOSPITAL_COMMUNITY): Payer: Self-pay | Admitting: *Deleted

## 2014-07-13 ENCOUNTER — Encounter (HOSPITAL_COMMUNITY)
Admission: RE | Admit: 2014-07-13 | Discharge: 2014-07-13 | Disposition: A | Discharge status: Home / Self Care | Payer: BC Managed Care – PPO | Source: Ambulatory Visit | Attending: Rheumatology | Admitting: Rheumatology

## 2014-07-13 DIAGNOSIS — L4050 Arthropathic psoriasis, unspecified: Secondary | ICD-10-CM | POA: Diagnosis present

## 2014-07-13 DIAGNOSIS — Z5181 Encounter for therapeutic drug level monitoring: Secondary | ICD-10-CM | POA: Diagnosis not present

## 2014-07-13 MED ORDER — SODIUM CHLORIDE 0.9 % IV SOLN: INTRAVENOUS | Status: DC

## 2014-07-13 MED ORDER — ACETAMINOPHEN 325 MG PO TABS
650.0000 mg | ORAL_TABLET | ORAL | Status: DC
  Administered 2014-07-13: 650 mg via ORAL

## 2014-07-13 MED ORDER — ACETAMINOPHEN 325 MG PO TABS
ORAL_TABLET | ORAL | Status: AC
  Filled 2014-07-13: qty 2

## 2014-07-13 MED ORDER — SODIUM CHLORIDE 0.9 % IV SOLN
5.0000 mg/kg | INTRAVENOUS | Status: DC
  Administered 2014-07-13: 500 mg via INTRAVENOUS
  Filled 2014-07-13: qty 50

## 2014-08-28 ENCOUNTER — Other Ambulatory Visit (HOSPITAL_COMMUNITY): Payer: Self-pay | Admitting: *Deleted

## 2014-08-31 ENCOUNTER — Encounter (HOSPITAL_COMMUNITY): Payer: Self-pay

## 2014-09-18 ENCOUNTER — Encounter (HOSPITAL_COMMUNITY)
Admission: RE | Admit: 2014-09-18 | Discharge: 2014-09-18 | Disposition: A | Discharge status: Home / Self Care | Payer: BC Managed Care – PPO | Source: Ambulatory Visit | Attending: Rheumatology | Admitting: Rheumatology

## 2014-09-18 DIAGNOSIS — Z5181 Encounter for therapeutic drug level monitoring: Secondary | ICD-10-CM | POA: Diagnosis not present

## 2014-09-18 DIAGNOSIS — L4050 Arthropathic psoriasis, unspecified: Secondary | ICD-10-CM | POA: Diagnosis not present

## 2014-09-18 MED ORDER — ACETAMINOPHEN 325 MG PO TABS
ORAL_TABLET | ORAL | Status: AC
  Filled 2014-09-18: qty 2

## 2014-09-18 MED ORDER — SODIUM CHLORIDE 0.9 % IV SOLN
Freq: Once | INTRAVENOUS | Status: AC
  Administered 2014-09-18: 10:00:00 via INTRAVENOUS

## 2014-09-18 MED ORDER — INFLIXIMAB 100 MG IV SOLR
5.0000 mg/kg | Freq: Once | INTRAVENOUS | Status: AC
  Administered 2014-09-18: 500 mg via INTRAVENOUS
  Filled 2014-09-18: qty 50

## 2014-09-18 MED ORDER — ACETAMINOPHEN 325 MG PO TABS
650.0000 mg | ORAL_TABLET | Freq: Once | ORAL | Status: AC
  Administered 2014-09-18: 650 mg via ORAL

## 2014-10-20 ENCOUNTER — Ambulatory Visit: Payer: BC Managed Care – PPO | Admitting: Podiatry

## 2014-11-06 ENCOUNTER — Encounter (HOSPITAL_COMMUNITY)
Admission: RE | Admit: 2014-11-06 | Discharge: 2014-11-06 | Disposition: A | Discharge status: Home / Self Care | Payer: BC Managed Care – PPO | Source: Ambulatory Visit | Attending: Rheumatology | Admitting: Rheumatology

## 2014-11-06 DIAGNOSIS — Z5181 Encounter for therapeutic drug level monitoring: Secondary | ICD-10-CM | POA: Diagnosis not present

## 2014-11-06 DIAGNOSIS — L4050 Arthropathic psoriasis, unspecified: Secondary | ICD-10-CM | POA: Insufficient documentation

## 2014-11-06 MED ORDER — SODIUM CHLORIDE 0.9 % IV SOLN
INTRAVENOUS | Status: DC
  Administered 2014-11-06: 09:00:00 via INTRAVENOUS

## 2014-11-06 MED ORDER — ACETAMINOPHEN 325 MG PO TABS
650.0000 mg | ORAL_TABLET | ORAL | Status: DC
  Administered 2014-11-06: 650 mg via ORAL

## 2014-11-06 MED ORDER — SODIUM CHLORIDE 0.9 % IV SOLN
5.0000 mg/kg | INTRAVENOUS | Status: DC
  Administered 2014-11-06: 500 mg via INTRAVENOUS
  Filled 2014-11-06: qty 50

## 2014-11-06 MED ORDER — ACETAMINOPHEN 325 MG PO TABS
ORAL_TABLET | ORAL | Status: AC
  Filled 2014-11-06: qty 2

## 2014-12-25 ENCOUNTER — Inpatient Hospital Stay (HOSPITAL_COMMUNITY)
Admission: RE | Admit: 2014-12-25 | Discharge: 2014-12-25 | Disposition: A | Discharge status: Home / Self Care | Payer: BC Managed Care – PPO | Source: Ambulatory Visit | Attending: Rheumatology | Admitting: Rheumatology

## 2015-01-18 ENCOUNTER — Encounter (HOSPITAL_COMMUNITY)
Admission: RE | Admit: 2015-01-18 | Discharge: 2015-01-18 | Disposition: A | Discharge status: Home / Self Care | Payer: BC Managed Care – PPO | Source: Ambulatory Visit | Attending: Rheumatology | Admitting: Rheumatology

## 2015-01-18 DIAGNOSIS — Z5181 Encounter for therapeutic drug level monitoring: Secondary | ICD-10-CM | POA: Diagnosis not present

## 2015-01-18 DIAGNOSIS — L4050 Arthropathic psoriasis, unspecified: Secondary | ICD-10-CM | POA: Diagnosis not present

## 2015-01-18 MED ORDER — ACETAMINOPHEN 325 MG PO TABS
ORAL_TABLET | ORAL | Status: AC
  Filled 2015-01-18: qty 2

## 2015-01-18 MED ORDER — INFLIXIMAB 100 MG IV SOLR
5.0000 mg/kg | INTRAVENOUS | Status: DC
  Administered 2015-01-18: 400 mg via INTRAVENOUS
  Filled 2015-01-18: qty 40

## 2015-01-18 MED ORDER — ACETAMINOPHEN 325 MG PO TABS
650.0000 mg | ORAL_TABLET | ORAL | Status: DC
  Administered 2015-01-18: 650 mg via ORAL

## 2015-01-18 MED ORDER — SODIUM CHLORIDE 0.9 % IV SOLN
INTRAVENOUS | Status: DC
  Administered 2015-01-18: 08:00:00 via INTRAVENOUS

## 2015-03-24 ENCOUNTER — Other Ambulatory Visit (HOSPITAL_COMMUNITY): Payer: Self-pay | Admitting: *Deleted

## 2015-03-25 ENCOUNTER — Encounter (HOSPITAL_COMMUNITY)
Admission: RE | Admit: 2015-03-25 | Discharge: 2015-03-25 | Disposition: A | Discharge status: Home / Self Care | Payer: BC Managed Care – PPO | Source: Ambulatory Visit | Attending: Rheumatology | Admitting: Rheumatology

## 2015-03-25 DIAGNOSIS — L4050 Arthropathic psoriasis, unspecified: Secondary | ICD-10-CM | POA: Diagnosis present

## 2015-03-25 DIAGNOSIS — Z5181 Encounter for therapeutic drug level monitoring: Secondary | ICD-10-CM | POA: Diagnosis not present

## 2015-03-25 MED ORDER — SODIUM CHLORIDE 0.9 % IV SOLN
5.0000 mg/kg | INTRAVENOUS | Status: DC
  Administered 2015-03-25: 400 mg via INTRAVENOUS
  Filled 2015-03-25: qty 40

## 2015-03-25 MED ORDER — ACETAMINOPHEN 325 MG PO TABS
ORAL_TABLET | ORAL | Status: AC
  Filled 2015-03-25: qty 2

## 2015-03-25 MED ORDER — SODIUM CHLORIDE 0.9 % IV SOLN
INTRAVENOUS | Status: DC
  Administered 2015-03-25: 10:00:00 via INTRAVENOUS

## 2015-03-25 MED ORDER — ACETAMINOPHEN 325 MG PO TABS
650.0000 mg | ORAL_TABLET | ORAL | Status: DC
  Administered 2015-03-25: 650 mg via ORAL

## 2015-05-11 ENCOUNTER — Encounter (HOSPITAL_COMMUNITY)
Admission: RE | Admit: 2015-05-11 | Discharge: 2015-05-11 | Disposition: A | Discharge status: Home / Self Care | Payer: BC Managed Care – PPO | Source: Ambulatory Visit | Attending: Rheumatology | Admitting: Rheumatology

## 2015-05-11 DIAGNOSIS — L4050 Arthropathic psoriasis, unspecified: Secondary | ICD-10-CM | POA: Diagnosis not present

## 2015-05-11 DIAGNOSIS — Z5181 Encounter for therapeutic drug level monitoring: Secondary | ICD-10-CM | POA: Diagnosis not present

## 2015-05-11 MED ORDER — ACETAMINOPHEN 325 MG PO TABS: 650.0000 mg | ORAL_TABLET | ORAL | Status: DC

## 2015-05-11 MED ORDER — SODIUM CHLORIDE 0.9 % IV SOLN
5.0000 mg/kg | INTRAVENOUS | Status: AC
  Administered 2015-05-11: 500 mg via INTRAVENOUS
  Filled 2015-05-11: qty 50

## 2015-05-11 MED ORDER — SODIUM CHLORIDE 0.9 % IV SOLN
INTRAVENOUS | Status: DC
  Administered 2015-05-11: 09:00:00 via INTRAVENOUS

## 2015-05-13 DIAGNOSIS — L4050 Arthropathic psoriasis, unspecified: Secondary | ICD-10-CM | POA: Diagnosis not present

## 2015-07-05 ENCOUNTER — Encounter (HOSPITAL_COMMUNITY)
Admission: RE | Admit: 2015-07-05 | Discharge: 2015-07-05 | Disposition: A | Discharge status: Home / Self Care | Payer: BC Managed Care – PPO | Source: Ambulatory Visit | Attending: Rheumatology | Admitting: Rheumatology

## 2015-07-05 DIAGNOSIS — L4050 Arthropathic psoriasis, unspecified: Secondary | ICD-10-CM | POA: Diagnosis not present

## 2015-07-05 DIAGNOSIS — Z5181 Encounter for therapeutic drug level monitoring: Secondary | ICD-10-CM | POA: Insufficient documentation

## 2015-07-05 MED ORDER — SODIUM CHLORIDE 0.9 % IV SOLN
5.0000 mg/kg | Freq: Once | INTRAVENOUS | Status: AC
  Administered 2015-07-05: 500 mg via INTRAVENOUS
  Filled 2015-07-05: qty 50

## 2015-07-05 MED ORDER — SODIUM CHLORIDE 0.9 % IV SOLN
Freq: Once | INTRAVENOUS | Status: AC
  Administered 2015-07-05: 09:00:00 via INTRAVENOUS

## 2015-07-05 MED ORDER — ACETAMINOPHEN 325 MG PO TABS: 650.0000 mg | ORAL_TABLET | Freq: Once | ORAL | Status: DC

## 2015-07-06 ENCOUNTER — Encounter (HOSPITAL_COMMUNITY): Payer: BC Managed Care – PPO

## 2015-08-19 ENCOUNTER — Other Ambulatory Visit (HOSPITAL_COMMUNITY): Payer: Self-pay | Admitting: *Deleted

## 2015-08-20 ENCOUNTER — Ambulatory Visit (HOSPITAL_COMMUNITY)
Admission: RE | Admit: 2015-08-20 | Discharge: 2015-08-20 | Disposition: A | Discharge status: Home / Self Care | Payer: BC Managed Care – PPO | Source: Ambulatory Visit | Attending: Rheumatology | Admitting: Rheumatology

## 2015-08-20 DIAGNOSIS — L4050 Arthropathic psoriasis, unspecified: Secondary | ICD-10-CM | POA: Diagnosis not present

## 2015-08-20 MED ORDER — ACETAMINOPHEN 325 MG PO TABS
650.0000 mg | ORAL_TABLET | ORAL | Status: DC
  Administered 2015-08-20: 650 mg via ORAL

## 2015-08-20 MED ORDER — SODIUM CHLORIDE 0.9 % IV SOLN
5.0000 mg/kg | INTRAVENOUS | Status: DC
  Administered 2015-08-20: 500 mg via INTRAVENOUS
  Filled 2015-08-20: qty 50

## 2015-08-20 MED ORDER — ACETAMINOPHEN 325 MG PO TABS
ORAL_TABLET | ORAL | Status: AC
  Filled 2015-08-20: qty 2

## 2015-08-20 MED ORDER — SODIUM CHLORIDE 0.9 % IV SOLN: INTRAVENOUS | Status: DC

## 2015-08-23 ENCOUNTER — Ambulatory Visit (HOSPITAL_COMMUNITY): Payer: BC Managed Care – PPO

## 2015-08-29 ENCOUNTER — Telehealth: Payer: Self-pay

## 2015-08-29 ENCOUNTER — Ambulatory Visit (INDEPENDENT_AMBULATORY_CARE_PROVIDER_SITE_OTHER): Payer: BC Managed Care – PPO | Admitting: Family Medicine

## 2015-08-29 ENCOUNTER — Other Ambulatory Visit: Payer: Self-pay

## 2015-08-29 VITALS — BP 110/68 | HR 68 | Temp 98.7°F | Resp 16 | Ht 66.0 in | Wt 206.0 lb

## 2015-08-29 DIAGNOSIS — J0100 Acute maxillary sinusitis, unspecified: Secondary | ICD-10-CM

## 2015-08-29 MED ORDER — AMOXICILLIN-POT CLAVULANATE 875-125 MG PO TABS: 1.0000 | ORAL_TABLET | Freq: Two times a day (BID) | ORAL | Status: DC

## 2015-08-29 NOTE — Progress Notes (Signed)
By signing my name below, I, Stann Oresung-Kai Tsai, attest that this documentation has been prepared under the direction and in the presence of Darren SidleKurt Zoriyah Scheidegger, MD. Electronically Signed: Stann Oresung-Kai Tsai, Scribe. 08/29/2015 , 1:35 PM .  Patient was seen in room 8 .   Patient ID: Darren Ramirez MRN: [id number], DOB: 1985/10/02, 30 y.o. Date of Encounter: 08/29/2015  Primary Physician: Sandrea HughsMichael Wert, MD  Chief Complaint:  Chief Complaint  Patient presents with  . Fatigue    Symptoms began 10 days ago   . Nasal Congestion  . Cough  . Sore Throat    HPI:  Darren Ramirez is a 30 y.o. male who presents to [facility] complaining of fatigue with nasal congestion, cough and sore throat that started about 10 days ago. He also notes having low grade fever yesterday. He informs having productive cough (yellow) in the mornings, and would clear up through the day. He's taken mucinex without any relief.   He works in [employer].   Past Medical History  Diagnosis Date  . Seasonal allergies   . Tinea pedis   . Anemia   . Arthralgia      Home Meds: Prior to Admission medications   Medication Sig Start Date End Date Taking? Authorizing Provider  cetirizine (ZYRTEC ALLERGY) 10 MG tablet Take 10 mg by mouth daily.   Yes Historical Provider, MD  inFLIXimab (REMICADE) 100 MG injection Inject into the vein.   Yes Historical Provider, MD  azithromycin (ZITHROMAX) 250 MG tablet Take 2 on day one then 1 daily x 4 days Patient not taking: Reported on 08/29/2015 06/25/13   Nyoka CowdenMichael B Wert, MD  HYDROcodone-acetaminophen (NORCO/VICODIN) 5-325 MG per tablet Take 2 tablets by mouth every 6 (six) hours as needed for pain. Patient not taking: Reported on 08/29/2015 10/30/12   Roxy Horsemanobert Browning, PA-C  ibuprofen (ADVIL,MOTRIN) 800 MG tablet Take 1 tablet (800 mg total) by mouth 3 (three) times daily. Patient not taking: Reported on 08/29/2015 10/30/12   Roxy Horsemanobert Browning, PA-C    Allergies: No Known  Allergies  Social History   Social History  . Marital Status: Single    Spouse Name: N/A  . Number of Children: N/A  . Years of Education: N/A   Occupational History  . Construction    Social History Main Topics  . Smoking status: Former Smoker -- 2 years    Types: Cigars    Quit date: 06/05/2010  . Smokeless tobacco: Not on file  . Alcohol Use: 0.0 oz/week     Comment: 1-2 times per month  . Drug Use: No  . Sexual Activity: Not on file   Other Topics Concern  . Not on file   Social History Narrative     Review of Systems: Constitutional: negative for fever, chills, night sweats, weight changes; positive for fatigue HEENT: negative for vision changes, hearing loss, rhinorrhea, epistaxis; positive for congestion, sore throat, sinus pressure Cardiovascular: negative for chest pain or palpitations Respiratory: negative for hemoptysis, wheezing, shortness of breath; positive for cough Abdominal: negative for abdominal pain, nausea, vomiting, diarrhea, or constipation Dermatological: negative for rash Neurologic: negative for headache, dizziness, or syncope All other systems reviewed and are otherwise negative with the exception to those above and in the HPI.  Physical Exam:  Blood pressure 110/68, pulse 68, temperature 98.7 F (37.1 C), temperature source Oral, resp. rate 16, height 5\' 6"  (1.676 m), weight 206 lb (93.441 kg), SpO2 97 %., Body mass index is 33.27 kg/(m^2). General: Well  developed, well nourished, in no acute distress. Head: Normocephalic, atraumatic, eyes without discharge, sclera non-icteric. Bilateral auditory canals clear, TM's are without perforation, pearly grey and translucent with reflective cone of light bilaterally. nasal passages completely obstructed  Neck: Supple. No thyromegaly. Full ROM. No lymphadenopathy. Lungs: Clear bilaterally to auscultation without wheezes, rales, or rhonchi. Breathing is unlabored. Heart: RRR with S1 S2. No murmurs,  rubs, or gallops appreciated. Msk:  Strength and tone normal for age. Extremities/Skin: Warm and dry. No clubbing or cyanosis. No edema. No rashes or suspicious lesions. Neuro: Alert and oriented X 3. Moves all extremities spontaneously. Gait is normal. CNII-XII grossly in tact. Psych:  Responds to questions appropriately with a normal affect.    ASSESSMENT AND PLAN:  30 y.o. year old male with Acute maxillary sinusitis, recurrence not specified - Plan: amoxicillin-clavulanate (AUGMENTIN) 875-125 MG tablet  This chart was scribed in my presence and reviewed by me personally.     Signed, Darren Sidle, MD 08/29/2015 1:37 PM

## 2015-08-29 NOTE — Addendum Note (Signed)
Addended byCaffie Damme: Briteny Fulghum W on: 08/29/2015 02:13 PM   Modules accepted: Orders

## 2015-08-29 NOTE — Patient Instructions (Addendum)
Pickup some Afrin (oxymetazoline) nasal spray for bed time use   Sinusitis, Adult Sinusitis is redness, soreness, and inflammation of the paranasal sinuses. Paranasal sinuses are air pockets within the bones of your face. They are located beneath your eyes, in the middle of your forehead, and above your eyes. In healthy paranasal sinuses, mucus is able to drain out, and air is able to circulate through them by way of your nose. However, when your paranasal sinuses are inflamed, mucus and air can become trapped. This can allow bacteria and other germs to grow and cause infection. Sinusitis can develop quickly and last only a short time (acute) or continue over a long period (chronic). Sinusitis that lasts for more than 12 weeks is considered chronic. CAUSES Causes of sinusitis include:  Allergies.  Structural abnormalities, such as displacement of the cartilage that separates your nostrils (deviated septum), which can decrease the air flow through your nose and sinuses and affect sinus drainage.  Functional abnormalities, such as when the small hairs (cilia) that line your sinuses and help remove mucus do not work properly or are not present. SIGNS AND SYMPTOMS Symptoms of acute and chronic sinusitis are the same. The primary symptoms are pain and pressure around the affected sinuses. Other symptoms include:  Upper toothache.  Earache.  Headache.  Bad breath.  Decreased sense of smell and taste.  A cough, which worsens when you are lying flat.  Fatigue.  Fever.  Thick drainage from your nose, which often is green and may contain pus (purulent).  Swelling and warmth over the affected sinuses. DIAGNOSIS Your health care provider will perform a physical exam. During your exam, your health care provider may perform any of the following to help determine if you have acute sinusitis or chronic sinusitis:  Look in your nose for signs of abnormal growths in your nostrils (nasal  polyps).  Tap over the affected sinus to check for signs of infection.  View the inside of your sinuses using an imaging device that has a light attached (endoscope). If your health care provider suspects that you have chronic sinusitis, one or more of the following tests may be recommended:  Allergy tests.  Nasal culture. A sample of mucus is taken from your nose, sent to a lab, and screened for bacteria.  Nasal cytology. A sample of mucus is taken from your nose and examined by your health care provider to determine if your sinusitis is related to an allergy. TREATMENT Most cases of acute sinusitis are related to a viral infection and will resolve on their own within 10 days. Sometimes, medicines are prescribed to help relieve symptoms of both acute and chronic sinusitis. These may include pain medicines, decongestants, nasal steroid sprays, or saline sprays. However, for sinusitis related to a bacterial infection, your health care provider will prescribe antibiotic medicines. These are medicines that will help kill the bacteria causing the infection. Rarely, sinusitis is caused by a fungal infection. In these cases, your health care provider will prescribe antifungal medicine. For some cases of chronic sinusitis, surgery is needed. Generally, these are cases in which sinusitis recurs more than 3 times per year, despite other treatments. HOME CARE INSTRUCTIONS  Drink plenty of water. Water helps thin the mucus so your sinuses can drain more easily.  Use a humidifier.  Inhale steam 3-4 times a day (for example, sit in the bathroom with the shower running).  Apply a warm, moist washcloth to your face 3-4 times a day, or as directed  by your health care provider.  Use saline nasal sprays to help moisten and clean your sinuses.  Take medicines only as directed by your health care provider.  If you were prescribed either an antibiotic or antifungal medicine, finish it all even if you start  to feel better. SEEK IMMEDIATE MEDICAL CARE IF:  You have increasing pain or severe headaches.  You have nausea, vomiting, or drowsiness.  You have swelling around your face.  You have vision problems.  You have a stiff neck.  You have difficulty breathing.   This information is not intended to replace advice given to you by your health care provider. Make sure you discuss any questions you have with your health care provider.   Document Released: 05/22/2005 Document Revised: 06/12/2014 Document Reviewed: 06/06/2011 Elsevier Interactive Patient Education [employer]

## 2015-08-29 NOTE — Telephone Encounter (Signed)
Patient would like the medication prescribed during his visit today to be transferred to [facility] on [address]

## 2015-08-29 NOTE — Telephone Encounter (Signed)
Done

## 2015-08-29 NOTE — Addendum Note (Signed)
Addended byCaffie Damme: Darren Ramirez on: 08/29/2015 01:41 PM   Modules accepted: Orders

## 2015-08-29 NOTE — Telephone Encounter (Signed)
[facility] [address] - Amoxicillin

## 2015-09-24 ENCOUNTER — Ambulatory Visit (HOSPITAL_COMMUNITY)
Admission: RE | Admit: 2015-09-24 | Discharge: 2015-09-24 | Disposition: A | Discharge status: Home / Self Care | Payer: BC Managed Care – PPO | Source: Ambulatory Visit | Attending: Rheumatology | Admitting: Rheumatology

## 2015-09-24 DIAGNOSIS — L4050 Arthropathic psoriasis, unspecified: Secondary | ICD-10-CM | POA: Insufficient documentation

## 2015-09-24 MED ORDER — ACETAMINOPHEN 325 MG PO TABS
ORAL_TABLET | ORAL | Status: AC
  Administered 2015-09-24: 650 mg via ORAL
  Filled 2015-09-24: qty 2

## 2015-09-24 MED ORDER — SODIUM CHLORIDE 0.9 % IV SOLN
Freq: Once | INTRAVENOUS | Status: AC
  Administered 2015-09-24: 09:00:00 via INTRAVENOUS

## 2015-09-24 MED ORDER — ACETAMINOPHEN 325 MG PO TABS
650.0000 mg | ORAL_TABLET | Freq: Once | ORAL | Status: AC
  Administered 2015-09-24: 650 mg via ORAL

## 2015-09-24 MED ORDER — SODIUM CHLORIDE 0.9 % IV SOLN
5.0000 mg/kg | Freq: Once | INTRAVENOUS | Status: AC
  Administered 2015-09-24: 500 mg via INTRAVENOUS
  Filled 2015-09-24: qty 50

## 2015-10-07 ENCOUNTER — Other Ambulatory Visit (INDEPENDENT_AMBULATORY_CARE_PROVIDER_SITE_OTHER): Payer: BC Managed Care – PPO

## 2015-10-07 ENCOUNTER — Ambulatory Visit (INDEPENDENT_AMBULATORY_CARE_PROVIDER_SITE_OTHER): Payer: BC Managed Care – PPO | Admitting: Internal Medicine

## 2015-10-07 ENCOUNTER — Encounter: Payer: Self-pay | Admitting: Internal Medicine

## 2015-10-07 ENCOUNTER — Ambulatory Visit (INDEPENDENT_AMBULATORY_CARE_PROVIDER_SITE_OTHER)
Admission: RE | Admit: 2015-10-07 | Discharge: 2015-10-07 | Disposition: A | Discharge status: Home / Self Care | Payer: BC Managed Care – PPO | Source: Ambulatory Visit | Attending: Internal Medicine | Admitting: Internal Medicine

## 2015-10-07 VITALS — BP 110/70 | HR 71 | Ht 67.0 in | Wt 208.0 lb

## 2015-10-07 DIAGNOSIS — E7800 Pure hypercholesterolemia, unspecified: Secondary | ICD-10-CM

## 2015-10-07 DIAGNOSIS — R079 Chest pain, unspecified: Secondary | ICD-10-CM

## 2015-10-07 DIAGNOSIS — R202 Paresthesia of skin: Secondary | ICD-10-CM | POA: Diagnosis not present

## 2015-10-07 DIAGNOSIS — R718 Other abnormality of red blood cells: Secondary | ICD-10-CM | POA: Diagnosis not present

## 2015-10-07 DIAGNOSIS — R0789 Other chest pain: Secondary | ICD-10-CM | POA: Diagnosis not present

## 2015-10-07 LAB — BASIC METABOLIC PANEL
BUN: 13 mg/dL (ref 6–23)
CALCIUM: 9.7 mg/dL (ref 8.4–10.5)
CHLORIDE: 101 meq/L (ref 96–112)
CO2: 29 meq/L (ref 19–32)
Creatinine, Ser: 1.17 mg/dL (ref 0.40–1.50)
GFR: 94.69 mL/min (ref >60.00)
GLUCOSE: 91 mg/dL (ref 70–99)
POTASSIUM: 4.3 meq/L (ref 3.5–5.1)
SODIUM: 137 meq/L (ref 135–145)

## 2015-10-07 LAB — HEPATIC FUNCTION PANEL
ALBUMIN: 4.8 g/dL (ref 3.5–5.2)
ALK PHOS: 63 U/L (ref 39–117)
ALT: 21 U/L (ref 0–53)
AST: 17 U/L (ref 0–37)
Bilirubin, Direct: 0.1 mg/dL (ref 0.0–0.3)
TOTAL PROTEIN: 7.9 g/dL (ref 6.0–8.3)
Total Bilirubin: 0.4 mg/dL (ref 0.2–1.2)

## 2015-10-07 LAB — CBC WITH DIFFERENTIAL/PLATELET
BASOS ABS: 0 10*3/uL (ref 0.0–0.1)
BASOS PCT: 0.5 % (ref 0.0–3.0)
EOS PCT: 1.5 % (ref 0.0–5.0)
Eosinophils Absolute: 0.2 10*3/uL (ref 0.0–0.7)
HCT: 42 % (ref 39.0–52.0)
HEMOGLOBIN: 14 g/dL (ref 13.0–17.0)
Lymphocytes Relative: 25.6 % (ref 12.0–46.0)
Lymphs Abs: 2.6 10*3/uL (ref 0.7–4.0)
MCHC: 33.3 g/dL (ref 30.0–36.0)
MCV: 72.9 fl — ABNORMAL LOW (ref 78.0–100.0)
MONO ABS: 0.5 10*3/uL (ref 0.1–1.0)
MONOS PCT: 4.8 % (ref 3.0–12.0)
Neutro Abs: 6.8 10*3/uL (ref 1.4–7.7)
Neutrophils Relative %: 67.6 % (ref 43.0–77.0)
Platelets: 337 10*3/uL (ref 150.0–400.0)
RBC: 5.76 Mil/uL (ref 4.22–5.81)
RDW: 14 % (ref 11.5–15.5)
WBC: 10.1 10*3/uL (ref 4.0–10.5)

## 2015-10-07 LAB — LIPID PANEL
CHOLESTEROL: 203 mg/dL — AB (ref 0–200)
HDL: 40.4 mg/dL (ref >39.00)
LDL Cholesterol: 146 mg/dL — ABNORMAL HIGH (ref 0–99)
NonHDL: 162.11
TRIGLYCERIDES: 79 mg/dL (ref 0.0–149.0)
Total CHOL/HDL Ratio: 5
VLDL: 15.8 mg/dL (ref 0.0–40.0)

## 2015-10-07 LAB — TSH: TSH: 0.87 u[IU]/mL (ref 0.35–4.50)

## 2015-10-07 NOTE — Patient Instructions (Addendum)
Please see patient coordinator before you leave today  to schedule referral to internal medicine for longterm follow up   Please remember to go to the lab and x-ray department downstairs for your tests - we will call you with the results when they are available.

## 2015-10-07 NOTE — Progress Notes (Signed)
d Subjective:     Patient ID: Darren Ramirez, male   DOB: 09-30-1985   MRN: [id number]    Brief patient profile:  30 yobm with h/o seasonal allergies controlled with otc's just in spring with pollen dx'd with  psoriatic arthritis 2011    October 01, 2009-- dx of psoriatic arthritis by Dareen Piano >rx remicade   10/07/2015 acute extended ov/Meral Geissinger re: multiple complaints Chief Complaint  Patient presents with  . Follow-up    Pt c/o right leg numbness- only occurs at night for the 8-9 months. He has had minimal numbess in his right arm as well. He also c/o occ CP- comes and goes.  numbness R chin happened 3 x in 9 months x a couple of min only when noted lies down"can't feel top sheet from knee down to top of foot anteriorly - doesn't think it affects the post leg/calf but really not a dermatomal pattern either and no assoc weakness or difficulty with walking/ has not tried to stand on toes with episodes. No assoc back pain or h/o back problems  Cp over last year, lasts x several secs to min, never with ex/  Luq / never sleeping or related to exertion, N or V or eructation/ flatus Arthritis/rash much better on remicade per Kathi Ludwig   No obvious day to day or daytime variability or assoc chronic cough or   chest tightness, subjective wheeze or overt sinus or hb symptoms. No unusual exp hx or h/o childhood pna/ asthma or knowledge of premature birth.  Sleeping ok without nocturnal  or early am exacerbation  of respiratory  c/o's or need for noct saba. Also denies any obvious fluctuation of symptoms with weather or environmental changes or other aggravating or alleviating factors except as outlined above   Current Medications, Allergies, Complete Past Medical History, Past Surgical History, Family History, and Social History were reviewed in [employer] record.  ROS  The following are not active complaints unless bolded sore throat, dysphagia, dental problems, itching, sneezing,   nasal congestion or excess/ purulent secretions, ear ache,   fever, chills, sweats, unintended wt loss, classically pleuritic or exertional cp, hemoptysis,  orthopnea pnd or leg swelling, presyncope, palpitations, abdominal pain, anorexia, nausea, vomiting, diarrhea  or change in bowel or bladder habits, change in stools or urine, dysuria,hematuria,  rash, arthralgias, visual complaints, headache, numbness, weakness or ataxia or problems with walking or coordination,  change in mood/affect or memory.              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 Started in 2007.  ETOH on the wkends.                Objective:   Physical Exam    Exam: wt 225 November 20, 2008 >>211 10/01/09>>208 Oct 22, 2009 11 > 214 06/12/2012 > 06/25/2013 228 > 10/07/2015  208   amb anxious wm jumping from symptom to symptom randomly/ vital signs reviewed   HEENT: nl dentition, turbinates, and orophanx. Nl external ear canals without cough reflex  NECK : without JVD/Nodes/TM/ nl carotid upstrokes bilaterally  LUNGS: no acc muscle use, clear to A and P bilaterally without cough on insp or exp  maneuvers  CV: RRR no s3 or murmur or increase in P2, no edema  ABD: soft and nontender with nl excursion in the supine position. No bruits or organomegaly, bowel sounds nl  MS: warm without deformities, calf tenderness, cyanosis or clubbing, 2nd finger swollen, tender esp at base. no redness, pulses intact.  knee, elbow rom nml, no other notable joint swelling on exam  SKIN: feet scaling has resolved. has dry patches on elbows.  NEURO: alert, approp, no deficits    CXR PA and  Lateral:   10/07/2015 :    I personally reviewed images and agree with radiology impression as follows:    No active cardiopulmonary disease.     Labs ordered/ reviewed:     Chemistry      Component Value Date/Time   NA 137 10/07/2015 1618   K 4.3 10/07/2015 1618   CL 101 10/07/2015 1618   CO2 29 10/07/2015 1618   BUN 13 10/07/2015 1618   CREATININE 1.17 10/07/2015 1618      Component Value Date/Time   CALCIUM 9.7 10/07/2015 1618   ALKPHOS 63 10/07/2015 1618   AST 17 10/07/2015 1618   ALT 21 10/07/2015 1618   BILITOT 0.4 10/07/2015 1618        Lab Results  Component Value Date   WBC 10.1 10/07/2015   HGB 14.0 10/07/2015   HCT 42.0 10/07/2015   MCV 72.9* 10/07/2015   PLT 337.0 10/07/2015         Lab Results  Component Value Date   TSH 0.87 10/07/2015               Assessment:

## 2015-10-08 NOTE — Progress Notes (Signed)
Quick Note:  LMTCB ______ 

## 2015-10-10 DIAGNOSIS — R202 Paresthesia of skin: Secondary | ICD-10-CM | POA: Insufficient documentation

## 2015-10-10 NOTE — Assessment & Plan Note (Signed)
Transient s features to suggest radiculopathy or cord/ cns lesion > reassured and instructed on symptoms to look for to indicate a more serious problem > referred to int medicine for f/u

## 2015-10-10 NOTE — Assessment & Plan Note (Signed)
Lab Results  Component Value Date   CHOL 203* 10/07/2015   HDL 40.40 10/07/2015   LDLCALC 146* 10/07/2015   TRIG 79.0 10/07/2015   CHOLHDL 5 10/07/2015    Referred to internal med for f/u, in meantime rec diet/ ex

## 2015-10-10 NOTE — Assessment & Plan Note (Signed)
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 Frequently these patients have had multiple negative GI workups and CT scans.  Treatment consists of avoiding foods that cause gas (especially beans and raw vegetables like spinach and salads)  and citrucel 1 heaping tsp twice daily with a large glass of water.  Pain should improve w/in 2 weeks and if not then consider further GI work up.

## 2015-10-10 NOTE — Assessment & Plan Note (Signed)
Chronic/ c/w thalassemia with nl h and H > no w/u planned

## 2015-10-11 ENCOUNTER — Telehealth: Payer: Self-pay | Admitting: General Practice

## 2015-10-11 NOTE — Telephone Encounter (Signed)
Mother would like to know if Dr. Posey ReaPlotnikov would take her son on as a patient.  Please advise.

## 2015-10-11 NOTE — Telephone Encounter (Signed)
Got patient scheduled

## 2015-10-11 NOTE — Telephone Encounter (Signed)
Ok Thx 

## 2015-10-29 ENCOUNTER — Encounter (HOSPITAL_COMMUNITY): Payer: BC Managed Care – PPO

## 2015-11-17 ENCOUNTER — Ambulatory Visit: Payer: BC Managed Care – PPO | Admitting: Internal Medicine

## 2015-12-15 ENCOUNTER — Ambulatory Visit: Payer: BC Managed Care – PPO | Admitting: Internal Medicine

## 2016-01-04 ENCOUNTER — Encounter (HOSPITAL_COMMUNITY)
Admission: RE | Admit: 2016-01-04 | Discharge: 2016-01-04 | Disposition: A | Discharge status: Home / Self Care | Payer: BC Managed Care – PPO | Source: Ambulatory Visit | Attending: Rheumatology | Admitting: Rheumatology

## 2016-01-04 DIAGNOSIS — L4059 Other psoriatic arthropathy: Secondary | ICD-10-CM | POA: Diagnosis not present

## 2016-01-04 MED ORDER — ACETAMINOPHEN 325 MG PO TABS
ORAL_TABLET | ORAL | Status: AC
  Filled 2016-01-04: qty 2

## 2016-01-04 MED ORDER — SODIUM CHLORIDE 0.9 % IV SOLN
5.0000 mg/kg | INTRAVENOUS | Status: DC
Start: 2016-01-04 — End: 2016-01-05
  Administered 2016-01-04: 500 mg via INTRAVENOUS
  Filled 2016-01-04: qty 20

## 2016-01-04 MED ORDER — SODIUM CHLORIDE 0.9 % IV SOLN
INTRAVENOUS | Status: DC
  Administered 2016-01-04: 09:00:00 via INTRAVENOUS

## 2016-01-04 MED ORDER — ACETAMINOPHEN 325 MG PO TABS
650.0000 mg | ORAL_TABLET | ORAL | Status: DC
  Administered 2016-01-04: 650 mg via ORAL

## 2016-01-05 ENCOUNTER — Inpatient Hospital Stay (HOSPITAL_COMMUNITY): Admission: RE | Admit: 2016-01-05 | Payer: BC Managed Care – PPO | Source: Ambulatory Visit

## 2016-02-04 ENCOUNTER — Other Ambulatory Visit (HOSPITAL_COMMUNITY): Payer: Self-pay | Admitting: *Deleted

## 2016-02-08 ENCOUNTER — Ambulatory Visit (HOSPITAL_COMMUNITY): Admission: RE | Admit: 2016-02-08 | Payer: BC Managed Care – PPO | Source: Ambulatory Visit

## 2016-02-25 ENCOUNTER — Other Ambulatory Visit (HOSPITAL_COMMUNITY): Payer: Self-pay | Admitting: *Deleted

## 2016-02-28 ENCOUNTER — Ambulatory Visit (HOSPITAL_COMMUNITY)
Admission: RE | Admit: 2016-02-28 | Discharge: 2016-02-28 | Disposition: A | Discharge status: Home / Self Care | Payer: BC Managed Care – PPO | Source: Ambulatory Visit | Attending: Rheumatology | Admitting: Rheumatology

## 2016-02-28 DIAGNOSIS — L4059 Other psoriatic arthropathy: Secondary | ICD-10-CM | POA: Insufficient documentation

## 2016-02-28 MED ORDER — SODIUM CHLORIDE 0.9 % IV SOLN
7.0000 mg/kg | INTRAVENOUS | Status: DC
  Administered 2016-02-28: 600 mg via INTRAVENOUS
  Filled 2016-02-28: qty 60

## 2016-02-28 MED ORDER — ACETAMINOPHEN 325 MG PO TABS
ORAL_TABLET | ORAL | Status: AC
  Filled 2016-02-28: qty 2

## 2016-02-28 MED ORDER — ACETAMINOPHEN 325 MG PO TABS
650.0000 mg | ORAL_TABLET | ORAL | Status: DC
  Administered 2016-02-28: 650 mg via ORAL

## 2016-02-28 MED ORDER — SODIUM CHLORIDE 0.9 % IV SOLN
INTRAVENOUS | Status: DC
  Administered 2016-02-28: 09:00:00 via INTRAVENOUS

## 2016-04-13 ENCOUNTER — Ambulatory Visit (INDEPENDENT_AMBULATORY_CARE_PROVIDER_SITE_OTHER): Payer: BC Managed Care – PPO | Admitting: Internal Medicine

## 2016-04-13 ENCOUNTER — Encounter: Payer: Self-pay | Admitting: Internal Medicine

## 2016-04-13 VITALS — BP 112/72 | HR 67 | Ht 67.0 in | Wt 221.0 lb

## 2016-04-13 DIAGNOSIS — M792 Neuralgia and neuritis, unspecified: Secondary | ICD-10-CM | POA: Insufficient documentation

## 2016-04-13 DIAGNOSIS — L4050 Arthropathic psoriasis, unspecified: Secondary | ICD-10-CM | POA: Diagnosis not present

## 2016-04-13 NOTE — Patient Instructions (Addendum)
Please see patient coordinator before you leave today  to schedule neurosurgery evaluatoin Dr Dutch QuintPoole   In meantime ok to use advil or aleve per bottle with meals  Ask Dr Kathi LudwigSyed to refer you to one of her partner's in internal medicine to follow you longterm   Return here as needed

## 2016-04-13 NOTE — Progress Notes (Signed)
d Subjective:     Patient ID: Darren Ramirez, male   DOB: 03-Mar-1986   MRN: [id number]    Brief patient profile:  30 yobm with h/o seasonal allergies controlled with otc's just in spring with pollen dx'd with  psoriatic arthritis 2011 [facility]      04/13/2016 acute extended ov/Boruch Manuele re:  New neck/L shoulder pain  Chief Complaint  Patient presents with  . Acute Visit    Pt c/o pain in upper back for the past 3-4 wks- started in his neck. He states that if he leans his head back, it makes his arms feel weak and numb.   numbness in L C-6 distribution occurred abruptly p "slept wrong" and decreased strength in arm but esp L hand grip since then - has only taken tylenol which helped the neck pain but not the strength/ numbness   No obvious day to day or daytime variability or assoc chronic cough or   chest tightness, subjective wheeze or overt sinus or hb symptoms. No unusual exp hx or h/o childhood pna/ asthma or knowledge of premature birth.  Sleeping ok without nocturnal  or early am exacerbation  of respiratory  c/o's or need for noct saba. Also denies any obvious fluctuation of symptoms with weather or environmental changes or other aggravating or alleviating factors except as outlined above   Current Medications, Allergies, Complete Past Medical History, Past Surgical History, Family History, and Social History were [name].  ROS  The following are not active complaints unless bolded sore throat, dysphagia, dental problems, itching, sneezing,  nasal congestion or excess/ purulent secretions, ear ache,   fever, chills, sweats, unintended wt loss, classically pleuritic or exertional cp, hemoptysis,  orthopnea pnd or leg swelling, presyncope, palpitations, abdominal pain, anorexia, nausea, vomiting, diarrhea  or change in bowel or bladder habits, change in stools or urine, dysuria,hematuria,  rash, arthralgias, visual complaints,  headache, numbness, weakness or ataxia or problems with walking or coordination,  change in mood/affect or memory.              Past History:  Psoriatic arthritis .....................................................    Syed  Obesity  - Target wt = 185 for BMI < 30  Seasonal Rhinitis  Health maint .............................................................Marland Kitchen.  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 Started in 2007.  ETOH on the wkends.                Objective:   Physical Exam    Exam: wt 225 November 20, 2008 >>211 10/01/09>>208 Oct 22, 2009 11 > 214 06/12/2012 > 06/25/2013 228 > 10/07/2015  208 > 04/13/2016 221   amb  bm nad   Vital signs reviewed   HEENT: nl dentition, turbinates, and orophanx. Nl external ear canals without cough reflex  NECK : without JVD/Nodes/TM/ nl carotid upstrokes bilaterally  LUNGS: no acc muscle use, clear to A and P bilaterally without cough on insp or exp maneuvers  CV: RRR no s3 or murmur or increase in P2, no edema  ABD: soft and nontender with nl excursion in the supine position. No bruits or organomegaly, bowel sounds nl  MS: warm without deformities, calf tenderness, cyanosis or clubbing,   SKIN: feet scaling has resolved. has dry patches on elbows.  NEURO: alert, approp, numbness reported in C6 distribution s def decrease in  reflexes LUE and full ROM of neck/ shoulders nor hand grip/opposition of thumb and index finger                   Assessment:

## 2016-04-13 NOTE — Assessment & Plan Note (Signed)
Followed by Rheumatology/ Dr Kathi LudwigSyed    Encouraged to obstain PC with [facility] to keep all her major care under one roof and return here prn resp issues

## 2016-04-13 NOTE — Assessment & Plan Note (Signed)
Onset mid Oct 2017 > referred to Dr Dutch QuintPoole for C6 radiculopathy  04/13/2016 >>>    Subjective weakness and numbness is C 6 distribution suggesting a C5 disc problem though no def findings on exam so before ordering MRI would like him to try nsaids and see Dr Dutch QuintPoole  I had an extended discussion with the patient reviewing all relevant studies completed to date and  lasting 15 to 20 minutes of a 25 minute visit    Each maintenance medication was reviewed in detail including most importantly the difference between maintenance and prns and under what circumstances the prns are to be triggered using an action plan format that is not reflected in the computer generated alphabetically organized AVS.    Please see instructions for details which were reviewed in writing and the patient given a copy highlighting the part that I personally wrote and discussed at today's ov.

## 2016-04-26 ENCOUNTER — Encounter (HOSPITAL_COMMUNITY)
Admission: RE | Admit: 2016-04-26 | Discharge: 2016-04-26 | Disposition: A | Discharge status: Home / Self Care | Payer: BC Managed Care – PPO | Source: Ambulatory Visit | Attending: Rheumatology | Admitting: Rheumatology

## 2016-04-26 DIAGNOSIS — L4059 Other psoriatic arthropathy: Secondary | ICD-10-CM | POA: Insufficient documentation

## 2016-04-26 MED ORDER — SODIUM CHLORIDE 0.9 % IV SOLN
7.0000 mg/kg | INTRAVENOUS | Status: DC
  Administered 2016-04-26: 700 mg via INTRAVENOUS
  Filled 2016-04-26 (×2): qty 70

## 2016-04-26 MED ORDER — SODIUM CHLORIDE 0.9 % IV SOLN
INTRAVENOUS | Status: DC
  Administered 2016-04-26: 09:00:00 via INTRAVENOUS

## 2016-04-26 MED ORDER — ACETAMINOPHEN 325 MG PO TABS
ORAL_TABLET | ORAL | Status: AC
  Filled 2016-04-26: qty 2

## 2016-04-26 MED ORDER — ACETAMINOPHEN 325 MG PO TABS
650.0000 mg | ORAL_TABLET | ORAL | Status: DC
  Administered 2016-04-26: 650 mg via ORAL

## 2016-06-02 ENCOUNTER — Other Ambulatory Visit (HOSPITAL_COMMUNITY): Payer: Self-pay | Admitting: *Deleted

## 2016-06-06 ENCOUNTER — Encounter (HOSPITAL_COMMUNITY): Admission: RE | Admit: 2016-06-06 | Payer: BC Managed Care – PPO | Source: Ambulatory Visit

## 2016-06-09 ENCOUNTER — Encounter (HOSPITAL_COMMUNITY)
Admission: RE | Admit: 2016-06-09 | Discharge: 2016-06-09 | Disposition: A | Discharge status: Home / Self Care | Payer: BC Managed Care – PPO | Source: Ambulatory Visit | Attending: Rheumatology | Admitting: Rheumatology

## 2016-06-09 DIAGNOSIS — L4059 Other psoriatic arthropathy: Secondary | ICD-10-CM | POA: Diagnosis not present

## 2016-06-09 MED ORDER — ACETAMINOPHEN 325 MG PO TABS: 650.0000 mg | ORAL_TABLET | ORAL | Status: DC

## 2016-06-09 MED ORDER — INFLIXIMAB 100 MG IV SOLR
7.0000 mg/kg | INTRAVENOUS | Status: AC
  Administered 2016-06-09: 700 mg via INTRAVENOUS
  Filled 2016-06-09: qty 70

## 2016-06-09 MED ORDER — SODIUM CHLORIDE 0.9 % IV SOLN
INTRAVENOUS | Status: AC
  Administered 2016-06-09: 09:00:00 via INTRAVENOUS

## 2016-07-21 ENCOUNTER — Ambulatory Visit (HOSPITAL_COMMUNITY)
Admission: RE | Admit: 2016-07-21 | Discharge: 2016-07-21 | Disposition: A | Discharge status: Home / Self Care | Payer: BC Managed Care – PPO | Source: Ambulatory Visit | Attending: Rheumatology | Admitting: Rheumatology

## 2016-07-21 DIAGNOSIS — L4059 Other psoriatic arthropathy: Secondary | ICD-10-CM | POA: Insufficient documentation

## 2016-07-21 MED ORDER — SODIUM CHLORIDE 0.9 % IV SOLN
INTRAVENOUS | Status: DC
  Administered 2016-07-21: 09:00:00 via INTRAVENOUS

## 2016-07-21 MED ORDER — ACETAMINOPHEN 325 MG PO TABS
ORAL_TABLET | ORAL | Status: AC
  Administered 2016-07-21: 09:00:00 650 mg via ORAL
  Filled 2016-07-21: qty 2

## 2016-07-21 MED ORDER — SODIUM CHLORIDE 0.9 % IV SOLN
7.0000 mg/kg | INTRAVENOUS | Status: DC
  Administered 2016-07-21: 09:00:00 700 mg via INTRAVENOUS
  Filled 2016-07-21: qty 70

## 2016-07-21 MED ORDER — ACETAMINOPHEN 325 MG PO TABS
650.0000 mg | ORAL_TABLET | ORAL | Status: DC
  Administered 2016-07-21: 650 mg via ORAL

## 2016-09-01 ENCOUNTER — Encounter (HOSPITAL_COMMUNITY)
Admission: RE | Admit: 2016-09-01 | Discharge: 2016-09-01 | Disposition: A | Discharge status: Home / Self Care | Payer: BC Managed Care – PPO | Source: Ambulatory Visit | Attending: Rheumatology | Admitting: Rheumatology

## 2016-09-01 DIAGNOSIS — L4059 Other psoriatic arthropathy: Secondary | ICD-10-CM | POA: Insufficient documentation

## 2016-09-01 MED ORDER — ACETAMINOPHEN 325 MG PO TABS
ORAL_TABLET | ORAL | Status: AC
  Filled 2016-09-01: qty 2

## 2016-09-01 MED ORDER — SODIUM CHLORIDE 0.9 % IV SOLN
7.0000 mg/kg | INTRAVENOUS | Status: DC
  Administered 2016-09-01: 09:00:00 700 mg via INTRAVENOUS
  Filled 2016-09-01: qty 70

## 2016-09-01 MED ORDER — ACETAMINOPHEN 325 MG PO TABS
650.0000 mg | ORAL_TABLET | ORAL | Status: DC
  Administered 2016-09-01: 650 mg via ORAL

## 2016-09-01 MED ORDER — SODIUM CHLORIDE 0.9 % IV SOLN: INTRAVENOUS | Status: DC

## 2017-10-16 ENCOUNTER — Telehealth: Payer: Self-pay | Admitting: Internal Medicine

## 2017-10-16 DIAGNOSIS — E7800 Pure hypercholesterolemia, unspecified: Secondary | ICD-10-CM

## 2017-10-16 DIAGNOSIS — L918 Other hypertrophic disorders of the skin: Secondary | ICD-10-CM

## 2017-10-16 NOTE — Telephone Encounter (Signed)
Ok to refer to [facility] dermatology or the one covered by his insurance   Also let him know we are moving to pulmonary only status this year so since he has now pulmonary issues will need to be referred to [facility] primary care closest to his home or work

## 2017-10-16 NOTE — Telephone Encounter (Signed)
MW can we refer pt to see a dermatologist to remove a skin tag? Spoke with mom and she wants to know if you would like to see him about it first since you are his PCP. Please advise. Pt hasn't been seen since 04/2016.

## 2017-10-17 NOTE — Telephone Encounter (Signed)
Spoke with pt's mother. She is aware of Dr. Thurston Hole response. Referrals have been placed for Internal Medicine and Dermatology. Nothing further was needed at this time.

## 2017-11-02 ENCOUNTER — Ambulatory Visit (INDEPENDENT_AMBULATORY_CARE_PROVIDER_SITE_OTHER): Payer: BLUE CROSS/BLUE SHIELD | Admitting: Family

## 2017-11-02 ENCOUNTER — Encounter: Payer: Self-pay | Admitting: Family

## 2017-11-02 ENCOUNTER — Other Ambulatory Visit (INDEPENDENT_AMBULATORY_CARE_PROVIDER_SITE_OTHER): Payer: BLUE CROSS/BLUE SHIELD

## 2017-11-02 VITALS — BP 110/78 | HR 74 | Temp 97.9°F | Ht 67.0 in | Wt 201.1 lb

## 2017-11-02 DIAGNOSIS — G8929 Other chronic pain: Secondary | ICD-10-CM

## 2017-11-02 DIAGNOSIS — Z13220 Encounter for screening for lipoid disorders: Secondary | ICD-10-CM

## 2017-11-02 DIAGNOSIS — R5383 Other fatigue: Secondary | ICD-10-CM | POA: Diagnosis not present

## 2017-11-02 DIAGNOSIS — Z114 Encounter for screening for human immunodeficiency virus [HIV]: Secondary | ICD-10-CM

## 2017-11-02 DIAGNOSIS — M25512 Pain in left shoulder: Secondary | ICD-10-CM

## 2017-11-02 LAB — LIPID PANEL
Cholesterol: 205 mg/dL — ABNORMAL HIGH (ref 0–200)
HDL: 41.8 mg/dL (ref >39.00)
LDL Cholesterol: 146 mg/dL — ABNORMAL HIGH (ref 0–99)
NonHDL: 162.94
Total CHOL/HDL Ratio: 5
Triglycerides: 84 mg/dL (ref 0.0–149.0)
VLDL: 16.8 mg/dL (ref 0.0–40.0)

## 2017-11-02 LAB — TSH: TSH: 0.89 u[IU]/mL (ref 0.35–4.50)

## 2017-11-02 NOTE — Progress Notes (Signed)
Darren Ramirez is a 32 y.o. male with the following history as recorded in EpicCare:  Patient Active Problem List   Diagnosis Date Noted  . Radicular pain in left arm 04/13/2016  . Paresthesia of right leg 10/10/2015  . Chest pain of unknown etiology 10/07/2015  . Upper respiratory tract infection 06/25/2013  . TINEA PEDIS 10/05/2009  . HYPERCHOLESTEROLEMIA 11/20/2008  . Psoriatic arthritis (HCC) 11/20/2008  . MICROCYTOSIS 11/20/2008    Current Outpatient Medications  Medication Sig Dispense Refill  . acetaminophen (TYLENOL) 325 MG tablet Take 650 mg by mouth every 6 (six) hours as needed.    . inFLIXimab (REMICADE) 100 MG injection Inject into the vein.     No current facility-administered medications for this visit.     Allergies: Patient has no known allergies.  Past Medical History:  Diagnosis Date  . Anemia   . Arthralgia   . Seasonal allergies   . Tinea pedis     History reviewed. No pertinent surgical history.  Family History  Problem Relation Age of Onset  . Hyperlipidemia Father   . Sleep apnea Father   . Thyroid disease Father     Social History   Tobacco Use  . Smoking status: Former Smoker    Years: 2.00    Types: Cigars    Last attempt to quit: 06/05/2010    Years since quitting: 7.4  Substance Use Topics  . Alcohol use: Yes    Alcohol/week: 0.0 oz    Comment: 1-2 times per month    Subjective:  Patient presents to establish care today; in baseline state of health today- just needed to get new PCP; has history of psoriatic arthritis- under care of rheumatology; does Remicade injections every 6 weeks; MD is checking labs regularly; history of hyperlipidemia- agreeable to updating labs today; works 2 jobs- up to 60 hours/ week- difficult to find extra time for exercise. Chronic left neck/ shoulder pain; would be interested in seeing sports medicine;    Objective:  Vitals:   11/02/17 0827  BP: 110/78  Pulse: 74  Temp: 97.9 F (36.6 C)  TempSrc:  Oral  SpO2: 96%  Weight: 201 lb 1.3 oz (91.2 kg)  Height:  (1.702 m)    General: Well developed, well nourished, in no acute distress  Skin : Warm and dry.  Head: Normocephalic and atraumatic  Lungs: Respirations unlabored; clear to auscultation bilaterally without wheeze, rales, rhonchi  CVS exam: normal rate and regular rhythm.  Musculoskeletal: No deformities; no active joint inflammation  Extremities: No edema, cyanosis, clubbing  Vessels: Symmetric bilaterally  Neurologic: Alert and oriented; speech intact; face symmetrical; moves all extremities well; CNII-XII intact without focal deficit   Assessment:  1. Chronic left shoulder pain   2. Lipid screening   3. Other fatigue   4. Encounter for screening for HIV     Plan:  1. Follow-up with sports medicine as discussed; 2. Check lipid panel; encouraged exercise, weight loss; 3. Check TSH due to FH of thyroid disease; 4. Check HIV;  Rheumatology is monitoring CBC, CMP regularly for psoriatic arthritis treatment/ response.   Return for Dr. Katrinka Blazing or Jordan Likes for left shoulder pain.  Orders Placed This Encounter  Procedures  . Lipid panel    Standing Status:   Future    Number of Occurrences:   1    Standing Expiration Date:   11/03/2018  . TSH    Standing Status:   Future    Number of Occurrences:  1    Standing Expiration Date:   11/02/2018  . HIV antibody    Standing Status:   Future    Number of Occurrences:   1    Standing Expiration Date:   11/03/2018    Requested Prescriptions    No prescriptions requested or ordered in this encounter

## 2017-11-03 LAB — HIV ANTIBODY (ROUTINE TESTING W REFLEX): HIV 1&2 Ab, 4th Generation: NONREACTIVE

## 2017-11-22 NOTE — Progress Notes (Signed)
Linus OrnOdysseus Battisti - 32 y.o. male MRN [id number]  Date of birth: 10/28/1985  SUBJECTIVE:  Including CC & ROS.  Chief Complaint  Patient presents with  . Left shoulder pain    Frances Jyl HeinzChavis is a 32 y.o. male that is presenting with left shoulder pain. Ongoing for one year, has been increasing over the past month.  Pain is located in the trapezius feels like the pain on the left side.  Also has some neck pain and shoulder pain.  Originates posteriorly and radiates anteriorly.  He admits some pain and altered sensation that he experiences in the palmar and dorsal side of his hand. Pain is constant Pain is mild to severe. Admits to weakness in his arm. He has been taking Motrin and applying ice. 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 Denies trauma or injury.   Has been to a chiropractor and had an x-ray of his shoulder and was reported to be normal.   Review of Systems  Constitutional: Negative for fever.  HENT: Negative for congestion.   Respiratory: Negative for cough.   Cardiovascular: Negative for chest pain.  Gastrointestinal: Negative for abdominal distention.  Musculoskeletal: Negative for gait problem.  Skin: Negative for color change.  Neurological: Negative for weakness.  Hematological: Negative for adenopathy.  Psychiatric/Behavioral: Negative for agitation.    HISTORY: Past Medical, Surgical, Social, and Family History Reviewed & Updated per EMR.   Pertinent Historical Findings include:  Past Medical History:  Diagnosis Date  . Anemia   . Arthralgia   . Seasonal allergies   . Tinea pedis     No past surgical history on file.  No Known Allergies  Family History  Problem Relation Age of Onset  . Hyperlipidemia Father   . Sleep apnea Father   . Thyroid disease Father      Social History   Socioeconomic History  . Marital status: Single    Spouse name: Not on file  . Number of children: Not on file  . Years of education: Not on file  .  Highest education level: Not on file  Occupational History  . Occupation: Training and development officerConstruction  Social Needs  . Financial resource strain: Not on file  . Food insecurity:    Worry: Not on file    Inability: Not on file  . Transportation needs:    Medical: Not on file    Non-medical: Not on file  Tobacco Use  . Smoking status: Former Smoker    Years: 2.00    Types: Cigars    Last attempt to quit: 06/05/2010    Years since quitting: 7.4  . Smokeless tobacco: Never Used  Substance and Sexual Activity  . Alcohol use: Yes    Comment: 1-2 times per month  . Drug use: No  . Sexual activity: Not on file  Lifestyle  . Physical activity:    Days per week: Not on file    Minutes per session: Not on file  . Stress: Not on file  Relationships  . Social connections:    Talks on phone: Not on file    Gets together: Not on file    Attends religious service: Not on file    Active member of club or organization: Not on file    Attends meetings of clubs or organizations: Not on file    Relationship status: Not on file  . Intimate partner violence:    Fear of current or ex partner: Not on file  Emotionally abused: Not on file    Physically abused: Not on file    Forced sexual activity: Not on file  Other Topics Concern  . Not on file  Social History Narrative  . Not on file     PHYSICAL EXAM:  VS: BP (!) 148/87 (BP Location: Right Arm, Patient Position: Sitting, Cuff Size: Normal)   Pulse 78   Ht 5\' 7"  (1.702 m)   Wt 204 lb (92.5 kg)   SpO2 98%   BMI 31.95 kg/m  Physical Exam Gen: NAD, alert, cooperative with exam, well-appearing ENT: normal lips, normal nasal mucosa,  Eye: normal EOM, normal conjunctiva and lids CV:  no edema, +2 pedal pulses   Resp: no accessory muscle use, non-labored,  Skin: no rashes, no areas of induration  Neuro: normal tone, normal sensation to touch Psych:  normal insight, alert and oriented MSK:  Left Shoulder: Inspection reveals no abnormalities,  atrophy or asymmetry. Palpation is normal with no tenderness over AC joint  ROM is full in all planes. Normal ER  Rotator cuff strength normal throughout. Pain with empty can testing  Normal scapular function observed. Neck:  Normal AROM  TTP along the left paracervical muscles  No midline tenderness  Neurovascularly intact    Limited ultrasound: left shoulder:  Normal appearing BT  Normal subscap  Supraspinatus with mild changes to suggest tendinopathy  Normal AC joint  Normal infraspinatus   Summary: supraspinatus tendinopathy changes but would not suggest to be the culprit of his pain  Ultrasound and interpretation by Clare Gandy, MD  Aspiration/Injection Procedure Note Haidan Nhan [birth year]  Procedure: Injection Indications: left trapezius and neck pain   Procedure Details Consent: Risks of procedure as well as the alternatives and risks of each were explained to the (patient/caregiver).  Consent for procedure obtained. Time Out: Verified patient identification, verified procedure, site/side was marked, verified correct patient position, special equipment/implants available, medications/allergies/relevent history reviewed, required imaging and test results available.  Performed.  The area was cleaned with iodine and alcohol swabs.    The left trapezius and left lateral neck trigger points were injected using 1 cc's of 40 mg Depomedrol and 5 cc's of 0.5% bupivacaine with a 25 1" needle.     A sterile dressing was applied.  Patient did tolerate procedure well.    ASSESSMENT & PLAN:   Cervical radiculopathy Symptoms seem more suggestive of radiculopathy. [place] was not revealing for a structural problem of the shoulder.  - trigger point injections today  - initiate gabapentin  - counseled on HEP  - if no improvement consider PT vs imaging

## 2017-11-23 ENCOUNTER — Encounter: Payer: Self-pay | Admitting: Family Medicine

## 2017-11-23 ENCOUNTER — Ambulatory Visit (INDEPENDENT_AMBULATORY_CARE_PROVIDER_SITE_OTHER): Payer: BLUE CROSS/BLUE SHIELD | Admitting: Family Medicine

## 2017-11-23 ENCOUNTER — Ambulatory Visit: Payer: Self-pay

## 2017-11-23 VITALS — BP 148/87 | HR 78 | Ht 67.0 in | Wt 204.0 lb

## 2017-11-23 DIAGNOSIS — M25512 Pain in left shoulder: Secondary | ICD-10-CM | POA: Diagnosis not present

## 2017-11-23 DIAGNOSIS — M5412 Radiculopathy, cervical region: Secondary | ICD-10-CM | POA: Insufficient documentation

## 2017-11-23 MED ORDER — GABAPENTIN 100 MG PO CAPS: 100.0000 mg | ORAL_CAPSULE | Freq: Three times a day (TID) | ORAL | 3 refills | Status: DC

## 2017-11-23 NOTE — Assessment & Plan Note (Signed)
Symptoms seem more suggestive of radiculopathy. US was not revealing for a structural problem of the shoulder.  - trigger point injections today  - initiate gabapentin  - counseled on HEP  - if no improvement consider PT vs imaging

## 2017-11-23 NOTE — Patient Instructions (Signed)
Nice to meet you  Please try the gabapentin. Please start with one pill at night. Then you can increase this to two times daily and up to three times daily as you tolerate.  Please try the exercises  Please follow up with me 3-4 weeks if your pain hasn't improved.

## 2018-07-04 ENCOUNTER — Encounter: Payer: Self-pay | Admitting: *Deleted

## 2018-07-04 ENCOUNTER — Ambulatory Visit: Payer: BLUE CROSS/BLUE SHIELD | Admitting: Primary Care

## 2018-07-04 ENCOUNTER — Ambulatory Visit (INDEPENDENT_AMBULATORY_CARE_PROVIDER_SITE_OTHER): Payer: BLUE CROSS/BLUE SHIELD | Admitting: Internal Medicine

## 2018-07-04 ENCOUNTER — Ambulatory Visit (INDEPENDENT_AMBULATORY_CARE_PROVIDER_SITE_OTHER)
Admission: RE | Admit: 2018-07-04 | Discharge: 2018-07-04 | Disposition: A | Discharge status: Home / Self Care | Payer: BLUE CROSS/BLUE SHIELD | Source: Ambulatory Visit | Attending: Internal Medicine | Admitting: Internal Medicine

## 2018-07-04 ENCOUNTER — Encounter: Payer: Self-pay | Admitting: Internal Medicine

## 2018-07-04 VITALS — BP 122/62 | HR 73 | Temp 99.2°F | Ht 67.0 in | Wt 211.0 lb

## 2018-07-04 DIAGNOSIS — R6889 Other general symptoms and signs: Secondary | ICD-10-CM

## 2018-07-04 DIAGNOSIS — J069 Acute upper respiratory infection, unspecified: Secondary | ICD-10-CM | POA: Diagnosis not present

## 2018-07-04 LAB — POCT INFLUENZA A/B
INFLUENZA A, POC: NEGATIVE
Influenza B, POC: NEGATIVE

## 2018-07-04 MED ORDER — OSELTAMIVIR PHOSPHATE 75 MG PO CAPS: 75.0000 mg | ORAL_CAPSULE | Freq: Two times a day (BID) | ORAL | 0 refills | Status: DC

## 2018-07-04 NOTE — Patient Instructions (Addendum)
No work until Jul 08 2018  tamiflu 75 mg twice daily x 5 days  Tylenol for pain or fever/ lots of fluids and broth based soups   Please remember to go to the  x-ray department  for your tests - we will call you with the results when they are available     Please schedule a follow up visit in 3 months but call sooner if needed for cpx

## 2018-07-04 NOTE — Progress Notes (Signed)
d Subjective:     Patient ID: Darren Ramirez, male   DOB: 01/30/1986   MRN: [id number]    Brief patient profile:  30 yobm with h/o seasonal allergies controlled with otc's just in spring with pollen dx'd with  psoriatic arthritis 2011 followed at [facility]/ Darren Ramirez     History of Present Illness  07/07/2018 acute extended ov/Darren Ramirez re: ? flu Chief Complaint  Patient presents with  . Acute Visit    woke up with body aches, HA, nausea, chills, sweats.   acutely ill am of ov with above symptoms really not much cough and no sob. Father admitted with pos Influenza A 07/03/18 (confirmed)   No obvious day to day or daytime variability or assoc excess/ purulent sputum or mucus plugs or hemoptysis or cp or chest tightness, subjective wheeze or overt sinus or hb symptoms.   Sleeping at baseline without nocturnal  or early am exacerbation  of respiratory  c/o's or need for noct saba. Also denies any obvious fluctuation of symptoms with weather or environmental changes or other aggravating or alleviating factors except as outlined above   No unusual exposure hx or h/o childhood pna/ asthma or knowledge of premature birth.  Current Allergies, Complete Past Medical History, Past Surgical History, Family History, and Social History were reviewed in [name] electronic medical record.  ROS  The following are not active complaints unless bolded Hoarseness, sore throat, dysphagia, dental problems, itching, sneezing,  nasal congestion or discharge of excess mucus or purulent secretions, ear ache,   fever, chills, sweats, unintended wt loss or wt gain, classically pleuritic or exertional cp,  orthopnea pnd or arm/hand swelling  or leg swelling, presyncope, palpitations, abdominal pain, anorexia, nausea, vomiting, diarrhea  or change in bowel habits or change in bladder habits, change in stools or change in urine, dysuria, hematuria,  rash, arthralgias, visual complaints, headache, numbness, weakness or ataxia or  problems with walking or coordination,  change in mood or  memory.        Current Meds  Medication Sig  . acetaminophen (TYLENOL) 325 MG tablet Take 650 mg by mouth every 6 (six) hours as needed.  . gabapentin (NEURONTIN) 100 MG capsule Take 1 capsule (100 mg total) by mouth 3 (three) times daily.  Marland Kitchen. inFLIXimab (REMICADE) 100 MG injection Inject into the vein.          Past History:  Psoriatic arthritis .....................................................    Darren Ramirez  Obesity  - Target wt = 185 for BMI < 30  Seasonal Rhinitis  Health maint .............................................................Marland Kitchen.  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 Started in 2007.  ETOH on the wkends.            Objective:   Physical Exam    Exam: wt 225 November 20, 2008 >>211 10/01/09>>208 Oct 22, 2009 11 > 214 06/12/2012 > 06/25/2013 228 >   07/04/2018   211   Vital signs reviewed - Note on arrival 02 sats  95% on RA   Amb mod obese bm nad   HEENT: nl dentition, turbinates bilaterally, and oropharynx. Nl external ear canals without cough reflex   NECK :  without JVD/Nodes/TM/ nl carotid upstrokes bilaterally   LUNGS: no acc muscle use,  Nl contour chest which is clear to A and P bilaterally without cough on insp or exp maneuvers   CV:  RRR  no s3 or murmur or increase in P2, and no edema   ABD:  soft and nontender with nl inspiratory excursion in the supine position. No bruits or organomegaly appreciated, bowel sounds nl  MS:  Nl gait/ ext warm without deformities, calf tenderness, cyanosis or clubbing No obvious joint restrictions   SKIN: warm and dry without lesions    NEURO:  alert, approp, nl sensorium with  no motor or cerebellar deficits apparent.      CXR PA and Lateral:   07/04/2018 :    I personally reviewed images and agree with radiology impression as follows:   No  active cardiopulmonary disease.   Labs ordered 07/04/2018  Flu swab neg                        Assessment:

## 2018-07-05 ENCOUNTER — Ambulatory Visit: Payer: BLUE CROSS/BLUE SHIELD | Admitting: Primary Care

## 2018-07-05 NOTE — Progress Notes (Signed)
Spoke with pt and notified of results per Dr. Wert. Pt verbalized understanding and denied any questions. 

## 2018-07-07 ENCOUNTER — Encounter: Payer: Self-pay | Admitting: Internal Medicine

## 2018-07-07 NOTE — Assessment & Plan Note (Addendum)
C/w viral source in pt on Remicade with flu A fm contact with no pna so rec rx as active influenza with tamiflu 75 mg bid   Discussed natural hx of viral uri's and what he needs to be on the lookout for in event of complications like pna or dehydration but for now fine to rx as outpt    > 50% of this acute office visit spent on counseling

## 2018-07-16 ENCOUNTER — Encounter: Payer: Self-pay | Admitting: *Deleted

## 2018-07-16 ENCOUNTER — Encounter: Payer: Self-pay | Admitting: Internal Medicine

## 2018-07-16 ENCOUNTER — Ambulatory Visit (INDEPENDENT_AMBULATORY_CARE_PROVIDER_SITE_OTHER): Payer: BLUE CROSS/BLUE SHIELD | Admitting: Internal Medicine

## 2018-07-16 VITALS — BP 110/70 | HR 85 | Ht 67.0 in | Wt 209.8 lb

## 2018-07-16 DIAGNOSIS — J069 Acute upper respiratory infection, unspecified: Secondary | ICD-10-CM | POA: Diagnosis not present

## 2018-07-16 MED ORDER — AZITHROMYCIN 250 MG PO TABS
ORAL_TABLET | ORAL | 0 refills | Status: DC
Start: 2018-07-16 — End: 2019-08-20

## 2018-07-16 MED ORDER — METHYLPREDNISOLONE ACETATE 80 MG/ML IJ SUSP
120.0000 mg | Freq: Once | INTRAMUSCULAR | Status: AC
  Administered 2018-07-16: 120 mg via INTRAMUSCULAR

## 2018-07-16 NOTE — Patient Instructions (Addendum)
For cough >  mucinex dm 1200 mg every 12hours as needed  zpak    Depomedrol mg IM  Today    If not 100% next week call for follow up appt   Please schedule a follow up visit in 6  months but call sooner if needed for CPX

## 2018-07-16 NOTE — Progress Notes (Signed)
d Subjective:     Patient ID: Darren Ramirez, male   DOB: [birth year]   MRN: [id number]    Brief patient profile:  32 yobm with h/o seasonal allergies controlled with otc's just in spring with pollen dx'd with  psoriatic arthritis 2011 followed at [facility]/ Syed     History of Present Illness  07/07/2018 acute extended ov/ re: ? flu Chief Complaint  Patient presents with  . Acute Visit    woke up with body aches, HA, nausea, chills, sweats.   acutely ill am of ov with above symptoms really not much cough and no sob. Father admitted with pos Influenza A 07/03/18 (confirmed)  rec No work until Jul 08 2018 tamiflu 75 mg twice daily x 5 days Tylenol for pain or fever/ lots of fluids and broth based soups    07/16/2018  f/u ov/ re:  cough Chief Complaint  Patient presents with  . Follow-up    Pt states he is hoarse, has congestion in chest, and also rattling in chest. Pt states he is coughing but no mucus.   Dyspnea:  Not limited by breathing from desired activities  But no aerobics Cough: slt yellow in am then all day non prod but rattling quality with sense of chest congestion   Sleeping: able to sleep ok, esp p mucinex  SABA use: none  02: none    No obvious day to day or daytime variability or assoc excess/ purulent sputum or mucus plugs or hemoptysis or cp or chest tightness, subjective wheeze or overt sinus or hb symptoms.   Sleeps without nocturnal  or early am exacerbation  of respiratory  c/o's or need for noct saba. Also denies any obvious fluctuation of symptoms with weather or environmental changes or other aggravating or alleviating factors except as outlined above   No unusual exposure hx or h/o childhood pna/ asthma or knowledge of premature birth.  Current Allergies, Complete Past Medical History, Past Surgical History, Family History, and Social History were reviewed in [name] electronic medical record.  ROS  The following are not active complaints  unless bolded Hoarseness, sore throat, dysphagia, dental problems, itching, sneezing,  nasal congestion or discharge of excess mucus or purulent secretions, ear ache,   fever, chills, sweats, unintended wt loss or wt gain, classically pleuritic or exertional cp,  orthopnea pnd or arm/hand swelling  or leg swelling, presyncope, palpitations, abdominal pain, anorexia, nausea, vomiting, diarrhea  or change in bowel habits or change in bladder habits, change in stools or change in urine, dysuria, hematuria,  rash, arthralgias, visual complaints, headache, numbness, weakness or ataxia or problems with walking or coordination,  change in mood or  memory.        Current Meds  Medication Sig  . acetaminophen (TYLENOL) 325 MG tablet Take 650 mg by mouth every 6 (six) hours as needed.  . gabapentin (NEURONTIN) 100 MG capsule Take 1 capsule (100 mg total) by mouth 3 (three) times daily.  Marland Kitchen. inFLIXimab (REMICADE) 100 MG injection Inject into the vein.             Past History:  Psoriatic arthritis .....................................................    Syed  Obesity  - Target wt = 185 for BMI < 30  Seasonal Rhinitis  Health maint .............................................................Marland Kitchen.  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 Started in 2007.  ETOH on the wkends.  Objective:   Physical Exam    Exam: wt 225 November 20, 2008 >>211 10/01/09>>208 Oct 22, 2009 11 > 214 06/12/2012 > 06/25/2013 228 >   07/04/2018   211  >  07/16/2018  211   Vital signs reviewed - Note on arrival 02 sats  97% on RA      amb bm/ slt congested sounding cough    HEENT: nl dentition, turbinates bilaterally, and oropharynx. Nl external ear canals without cough reflex   NECK :  without JVD/Nodes/TM/ nl carotid upstrokes bilaterally   LUNGS: no acc muscle use,  Nl contour chest with a few insp  pops/squeaks and exp rhonchi bilaterally without cough on insp or exp maneuvers   CV:  RRR  no s3 or murmur or increase in P2, and no edema   ABD:  soft and nontender with nl inspiratory excursion in the supine position. No bruits or organomegaly appreciated, bowel sounds nl  MS:  Nl gait/ ext warm without deformities, calf tenderness, cyanosis or clubbing No obvious joint restrictions   SKIN: warm and dry without lesions    NEURO:  alert, approp, nl sensorium with  no motor or cerebellar deficits apparent.                            Assessment:

## 2018-07-16 NOTE — Assessment & Plan Note (Signed)
Mild residual tracheobronchitis following viral uri ? Influenza with fall pos screen s/p 5 days of tamiflu completed 07/12/2018 without true wheeze or evidence of significant secondary bacterial infection but still considerable airways inflammation/ instability and likely acutely acquired mucociliary dysfunction    >>> rec depomedrol 120 mg IM/ zpak and mucinex dm prn, f/u if not all better w/in  A week but if all better can see for cpx in 6 m   Each maintenance medication was reviewed in detail including most importantly the difference between maintenance and as needed and under what circumstances the prns are to be used.  Please see AVS for specific  Instructions which are unique to this visit and I personally typed out  which were reviewed in detail in writing with the patient and a copy provided.

## 2018-10-03 ENCOUNTER — Ambulatory Visit: Payer: BLUE CROSS/BLUE SHIELD | Admitting: Internal Medicine

## 2019-01-14 ENCOUNTER — Ambulatory Visit: Payer: BLUE CROSS/BLUE SHIELD | Admitting: Internal Medicine

## 2019-07-24 ENCOUNTER — Ambulatory Visit: Payer: BC Managed Care – PPO

## 2019-07-28 ENCOUNTER — Ambulatory Visit: Payer: BC Managed Care – PPO | Attending: Family

## 2019-07-28 DIAGNOSIS — Z23 Encounter for immunization: Secondary | ICD-10-CM | POA: Insufficient documentation

## 2019-07-28 NOTE — Progress Notes (Signed)
   Covid-19 Vaccination Clinic  Name:  Darren Ramirez    MRN: [id number] DOB: [birth year]  07/28/2019  [name] was observed post Covid-19 immunization for 15 minutes without incidence. He was provided with Vaccine Information Sheet and instruction to access the V-Safe system.   [name] was instructed to call 911 with any severe reactions post vaccine: Marland Kitchen Difficulty breathing  . Swelling of your face and throat  . A fast heartbeat  . A bad rash all over your body  . Dizziness and weakness    Immunizations Administered    Name Date Dose VIS Date Route   Moderna COVID-19 Vaccine 07/28/2019  2:39 PM 0.5 mL 05/06/2019 Intramuscular   Manufacturer: Moderna   Lot: [id number]   NDC: 29244-628-63

## 2019-08-19 ENCOUNTER — Telehealth: Payer: Self-pay | Admitting: Internal Medicine

## 2019-08-19 NOTE — Telephone Encounter (Signed)
Spoke with the pt  He is c/o nasal congestion that started 2 days ago  He states "sinuses feel stopped up"- taking mucinex D without relief  No cough, fever, SOB  Televisit for tomorrow with [employer]

## 2019-08-20 ENCOUNTER — Encounter: Payer: Self-pay | Admitting: Primary Care

## 2019-08-20 ENCOUNTER — Ambulatory Visit (INDEPENDENT_AMBULATORY_CARE_PROVIDER_SITE_OTHER): Payer: BC Managed Care – PPO | Admitting: Primary Care

## 2019-08-20 ENCOUNTER — Other Ambulatory Visit: Payer: Self-pay

## 2019-08-20 DIAGNOSIS — J069 Acute upper respiratory infection, unspecified: Secondary | ICD-10-CM

## 2019-08-20 MED ORDER — AZITHROMYCIN 250 MG PO TABS: ORAL_TABLET | ORAL | 0 refills | Status: DC

## 2019-08-20 NOTE — Progress Notes (Signed)
Virtual Visit via Telephone Note  I connected with Darren Ramirez on 08/20/19 at 10:30 AM EDT by telephone and verified that I am speaking with the correct person using two identifiers.  Location: Patient: Home Provider: Home   I discussed the limitations, risks, security and privacy concerns of performing an evaluation and management service by telephone and the availability of in person appointments. I also discussed with the patient that there may be a patient responsible charge related to this service. The patient expressed understanding and agreed to proceed.   History of Present Illness: 34 year old male, former smoker. PMH significant for seasonal allergies, psoriatic arthritis. Patient of Dr. Sherene Sires, last seen on 07/16/18.   08/20/2019 Patient contacted today for acute televisit. Reports nasal congestion with dark yellow mucus x 3-4 days. Symptoms worse in the morning. He has been taking mucinex dm which has helped some. States that he can't breath through his nose. No wheezing or shortness of breath. No recent sick contacts. Denies fever, chills, sweats, change in smell or taste.   Observations/Objective:  - Able to speak in full sentences; no shortness of breath or cough  Assessment and Plan:  Acute Sinusitis: - Nasal congestion with yellow mucus x 3-4 days - Continue Mucinex DM; advised flonase nasal spray daily  - RX zpack, hold off on steriods  Follow Up Instructions:   - Return or call if symptoms not better in 1 week  I discussed the assessment and treatment plan with the patient. The patient was provided an opportunity to ask questions and all were answered. The patient agreed with the plan and demonstrated an understanding of the instructions.   The patient was advised to call back or seek an in-person evaluation if the symptoms worsen or if the condition fails to improve as anticipated.  I provided 10 minutes of non-face-to-face time during this  encounter.   Glenford Bayley, NP

## 2019-08-20 NOTE — Patient Instructions (Signed)
  Acute Sinusitis: - Nasal congestion with yellow mucus x 3-4 days - Continue Mucinex DM; advised flonase nasal spray daily  - RX zpack, hold off on steriods

## 2019-08-21 ENCOUNTER — Ambulatory Visit: Payer: BC Managed Care – PPO | Admitting: Primary Care

## 2019-09-02 ENCOUNTER — Ambulatory Visit: Payer: BC Managed Care – PPO | Attending: Family

## 2019-09-02 DIAGNOSIS — Z23 Encounter for immunization: Secondary | ICD-10-CM

## 2019-09-02 NOTE — Progress Notes (Signed)
   Covid-19 Vaccination Clinic  Name:  Darren Ramirez    MRN: [id number] DOB: 03-10-1986  09/02/2019  Mr. Darren Ramirez was observed post Covid-19 immunization for 15 minutes without incident. He was provided with Vaccine Information Sheet and instruction to access the V-Safe system.   Mr. Darren Ramirez was instructed to call 911 with any severe reactions post vaccine: Marland Kitchen Difficulty breathing  . Swelling of face and throat  . A fast heartbeat  . A bad rash all over body  . Dizziness and weakness   Immunizations Administered    Name Date Dose VIS Date Route   Moderna COVID-19 Vaccine 09/02/2019  2:58 PM 0.5 mL 05/06/2019 Intramuscular   Manufacturer: Moderna   Lot: [id number]   NDC: 38453-646-80

## 2020-01-09 ENCOUNTER — Ambulatory Visit: Payer: BC Managed Care – PPO | Admitting: Internal Medicine

## 2020-01-14 ENCOUNTER — Telehealth: Payer: Self-pay | Admitting: Internal Medicine

## 2020-01-14 NOTE — Telephone Encounter (Signed)
Called and spoke with pt who stated about 5 days ago he began having complaints of nasal congestion one day and then he will have nasal drainage. Pt also states that he has chest congestion. Pt states in the morning the mucus is brown in color but then throughout the day the mucus is clear. Pt states that he is unable to cough up any phlegm as the cough is mainly a dry cough.  Pt stated that he did go get covid tested due to his symptoms and he stated that the test came back negative.  Pt states that he is able to smell and is still able to taste.  Pt has had both of his covid vaccines which are available to be seen under pt's immunization tab.  Pt denies any complaint of fever as the last temp was 97 temporal.  Pt has tried zyrtec and claritin OTC to see if it would help with his symptoms and he also stated that he took mucinex but none of those meds have helped with his symptoms. Pt stated that he has taken the zyrtec every day for the last 3-4 days but has only taken the mucinex once so he is unsure if the meds have really worked.  Pt wants recommendations for his symptoms and if something could be prescribed. Dr. Sherene Sires, please advise.

## 2020-01-15 MED ORDER — AZITHROMYCIN 250 MG PO TABS: 250.0000 mg | ORAL_TABLET | ORAL | 0 refills | Status: DC

## 2020-01-15 NOTE — Telephone Encounter (Signed)
Spoke with the pt and notified of recs per MW  He verbalized understanding and will keep appt, rx sent to pharm

## 2020-01-15 NOTE — Telephone Encounter (Signed)
zpak  Keep appt to see me 01/23/20  Use advil cold and sinus prn (has to sign for it at the pharmacy as it's kept behind the counter

## 2020-01-23 ENCOUNTER — Other Ambulatory Visit: Payer: Self-pay

## 2020-01-23 ENCOUNTER — Ambulatory Visit (INDEPENDENT_AMBULATORY_CARE_PROVIDER_SITE_OTHER): Payer: BC Managed Care – PPO | Admitting: Internal Medicine

## 2020-01-23 ENCOUNTER — Encounter: Payer: Self-pay | Admitting: Internal Medicine

## 2020-01-23 DIAGNOSIS — L4050 Arthropathic psoriasis, unspecified: Secondary | ICD-10-CM | POA: Diagnosis not present

## 2020-01-23 DIAGNOSIS — L089 Local infection of the skin and subcutaneous tissue, unspecified: Secondary | ICD-10-CM | POA: Insufficient documentation

## 2020-01-23 DIAGNOSIS — J069 Acute upper respiratory infection, unspecified: Secondary | ICD-10-CM

## 2020-01-23 NOTE — Assessment & Plan Note (Signed)
R buttocks onset early 8/ 2021 > rec epsom salt soaks > dermatology f/u prn   Advised f/u needed and inquire whether booster for covid 19 recommended          Each maintenance medication was reviewed in detail including emphasizing most importantly the difference between maintenance and prns and under what circumstances the prns are to be triggered using an action plan format where appropriate.  Total time for H and P, chart review, counseling,   and generating customized AVS unique to this office visit / charting = 15 min

## 2020-01-23 NOTE — Patient Instructions (Signed)
Soak in Epson salts  X 15 min several times a day and if not improving call for referring to dermatology  Ask Dr Kathi Ludwig about booster and to refer you for primary care to her same practice.

## 2020-01-23 NOTE — Progress Notes (Signed)
d Subjective:     Patient ID: Darren Ramirez, male   DOB: 01-11-1986   MRN: [id number]    Brief patient profile:  34 yobm with h/o seasonal allergies controlled with otc's just in spring with pollen dx'd with  psoriatic arthritis 2011 followed at [facility]/ [facility]     History of Present Illness  07/07/2018 acute extended ov/Ernestine Langworthy re: ? flu Chief Complaint  Patient presents with  . Acute Visit    woke up with body aches, HA, nausea, chills, sweats.   acutely ill am of ov with above symptoms really not much cough and no sob. Father admitted with pos Influenza A 07/03/18 (confirmed)  rec No work until Jul 08 2018 tamiflu 75 mg twice daily x 5 days Tylenol for pain or fever/ lots of fluids and broth based soups    07/16/2018  f/u ov/Jaleen Grupp re:  cough Chief Complaint  Patient presents with  . Follow-up    Pt states he is hoarse, has congestion in chest, and also rattling in chest. Pt states he is coughing but no mucus.   Dyspnea:  Not limited by breathing from desired activities  But no aerobics Cough: slt yellow in am then all day non prod but rattling quality with sense of chest congestion  Sleeping: able to sleep ok, esp p mucinex  SABA use: none  02: none  rec For cough >  mucinex dm 1200 mg every 12hours as needed zpak  Depomedrol mg IM  Today      08/20/19   Televist: Acute Sinusitis: - Nasal congestion with yellow mucus x 3-4 days - Continue Mucinex DM; advised flonase nasal spray daily  - RX zpack, hold off on steriods     01/14/20 phone care ? Recurrent sinusitis rec zpak  Use advil cold and sinus prn (has to sign for it at the pharmacy as it's kept behind the counter  01/23/2020  f/u ov/Jaslyne Beeck re: recurrent sinus infections / resolved p above rx  Chief Complaint  Patient presents with  . 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   sleeping without nocturnal  or early am exacerbation  of respiratory  c/o's or need for noct saba. Also denies any obvious fluctuation of symptoms with weather or environmental changes or other aggravating or alleviating factors except as outlined above   No unusual exposure hx or h/o childhood pna/ asthma or knowledge of premature birth.  Current Allergies, Complete Past Medical History, Past Surgical History, Family History, and Social History were reviewed in [employer] record.  ROS  The following are not active complaints unless bolded Hoarseness, sore throat, dysphagia, dental problems, itching, sneezing,  nasal congestion or discharge of excess mucus or purulent secretions, ear ache,   fever, chills, sweats, unintended wt loss or wt gain, classically pleuritic or exertional cp,  orthopnea pnd or arm/hand swelling  or leg swelling, presyncope, palpitations, abdominal pain, anorexia, nausea, vomiting, diarrhea  or change in bowel habits or change in bladder habits, change in stools or change in urine, dysuria, hematuria,  rash, arthralgias, visual complaints, headache, numbness, weakness or ataxia or problems with walking or  coordination,  change in mood or  memory.        Current Meds  Medication Sig  . acetaminophen (TYLENOL) 325 MG tablet Take 650 mg by mouth every 6 (six) hours as needed.  . inFLIXimab (REMICADE) 100 MG injection Inject into the vein.            Past History:  Psoriatic arthritis .....................................................    [facility]  Obesity  - Target wt = 185 for BMI < 30  Seasonal Rhinitis  Health maint .............................................................Marland Kitchen  [facility]  - CPX 06/12/12    Social History:  works with Secretary/administrator  company Never smoked cigs  Smokes marijuana 3 times per wk. Started in 2007.  ETOH on the wkends.            Objective:   Physical Exam   Wt Readings from Last 3 Encounters:  01/23/20 208 lb 12.8 oz (94.7 kg)  07/16/18 209 lb 12.8 oz (95.2 kg)  07/04/18 211 lb (95.7 kg)     Vital signs reviewed - Note on arrival 01/23/2020  02 sats  96% on RA    HEENT : pt wearing mask not removed for exam due to covid -19 concerns.    NECK :  without JVD/Nodes/TM/ nl carotid upstrokes bilaterally   LUNGS: no acc muscle use,  Nl contour chest which is clear to A and P bilaterally without cough on insp or exp maneuvers   CV:  RRR  no s3 or murmur or increase in P2, and no edema   ABD:  soft and nontender with nl inspiratory excursion in the supine position. No bruits or organomegaly appreciated, bowel sounds nl  MS:  Nl gait/ ext warm without deformities, calf tenderness, cyanosis or clubbing No obvious joint restrictions   SKIN: warm and dry with punctate papule R buttocks s head/ min surrounding erythema     NEURO:  alert, approp, nl sensorium with  no motor or cerebellar deficits apparent.                          Assessment:

## 2020-01-23 NOTE — Assessment & Plan Note (Signed)
Resolved, advised needs to establish with PCP going forward and since seen [facility] anyway should probably go to [facility] for this and see me prn resp symptoms

## 2020-01-23 NOTE — Assessment & Plan Note (Signed)
R buttocks onset early 8/ 2021 > rec epsom salt soaks > dermatology f/u prn

## 2020-06-09 ENCOUNTER — Other Ambulatory Visit: Payer: BC Managed Care – PPO

## 2020-06-09 DIAGNOSIS — Z20822 Contact with and (suspected) exposure to covid-19: Secondary | ICD-10-CM

## 2020-06-10 LAB — NOVEL CORONAVIRUS, NAA: SARS-CoV-2, NAA: DETECTED — AB

## 2020-06-10 LAB — SARS-COV-2, NAA 2 DAY TAT

## 2020-07-19 ENCOUNTER — Telehealth: Payer: Self-pay | Admitting: Internal Medicine

## 2020-07-19 NOTE — Telephone Encounter (Signed)
Will close this message. New message created.

## 2021-01-11 ENCOUNTER — Ambulatory Visit
Admission: RE | Admit: 2021-01-11 | Discharge: 2021-01-11 | Disposition: A | Discharge status: Home / Self Care | Payer: BC Managed Care – PPO | Source: Ambulatory Visit | Attending: Emergency Medicine | Admitting: Emergency Medicine

## 2021-01-11 ENCOUNTER — Other Ambulatory Visit: Payer: Self-pay

## 2021-01-11 VITALS — BP 132/87 | HR 73 | Temp 98.1°F | Resp 18

## 2021-01-11 DIAGNOSIS — B9689 Other specified bacterial agents as the cause of diseases classified elsewhere: Secondary | ICD-10-CM

## 2021-01-11 DIAGNOSIS — J019 Acute sinusitis, unspecified: Secondary | ICD-10-CM | POA: Diagnosis not present

## 2021-01-11 MED ORDER — AMOXICILLIN-POT CLAVULANATE 875-125 MG PO TABS: 1.0000 | ORAL_TABLET | Freq: Two times a day (BID) | ORAL | 0 refills | Status: DC

## 2021-01-11 MED ORDER — BENZONATATE 100 MG PO CAPS: 100.0000 mg | ORAL_CAPSULE | Freq: Three times a day (TID) | ORAL | 0 refills | Status: DC

## 2021-01-11 NOTE — ED Provider Notes (Signed)
[name]    CSN: [id number] Arrival date & time: 01/11/21  1148      History   Chief Complaint Chief Complaint  Patient presents with   appt 12 - cough    HPI Darren Ramirez is a 35 y.o. male.   Patient presents with nonproductive cough, postnasal drip, rhinorrhea for at least 10 days.  Does not feel like symptoms are getting worse but there has been no improvement.  Using over-the-counter Mucinex with minimal relief.  Home COVID test taken twice, negative.  Denies headaches, ear pain or fullness, fevers, chills, body aches, abdominal pain, nausea, vomiting, diarrhea, shortness of breath, wheezing, chest pain.  Past Medical History:  Diagnosis Date   Anemia    Arthralgia    Seasonal allergies    Tinea pedis     Patient Active Problem List   Diagnosis Date Noted   Pustule 01/23/2020   Cervical radiculopathy 11/23/2017   Radicular pain in left arm 04/13/2016   Paresthesia of right leg 10/10/2015   Chest pain of unknown etiology 10/07/2015   Upper respiratory tract infection 06/25/2013   TINEA PEDIS 10/05/2009   HYPERCHOLESTEROLEMIA 11/20/2008   Psoriatic arthritis (HCC) 11/20/2008   MICROCYTOSIS 11/20/2008    History reviewed. No pertinent surgical history.     Home Medications    Prior to Admission medications   Medication Sig Start Date End Date Taking? Authorizing Provider  amoxicillin-clavulanate (AUGMENTIN) 875-125 MG tablet Take 1 tablet by mouth every 12 (twelve) hours. 01/11/21  Yes Willard Farquharson R, NP  benzonatate (TESSALON) 100 MG capsule Take 1 capsule (100 mg total) by mouth every 8 (eight) hours. 01/11/21  Yes Emanuele Mcwhirter, Elita Boone, NP  acetaminophen (TYLENOL) 325 MG tablet Take 650 mg by mouth every 6 (six) hours as needed.    [provider]  inFLIXimab (REMICADE) 100 MG injection Inject into the vein.    [provider]    Family History Family History  Problem Relation Age of Onset   Hyperlipidemia Father     Sleep apnea Father    Thyroid disease Father     Social History Social History   Tobacco Use   Smoking status: Former    Types: Cigars    Quit date: 06/05/2010    Years since quitting: 10.6   Smokeless tobacco: Never  Substance Use Topics   Alcohol use: Yes    Comment: 1-2 times per month   Drug use: No     Allergies   Patient has no known allergies.   Review of Systems Review of Systems Defer to HPI    Physical Exam Triage Vital Signs ED Triage Vitals  Enc Vitals Group     BP 01/11/21 1157 132/87     Pulse Rate 01/11/21 1157 73     Resp 01/11/21 1157 18     Temp 01/11/21 1157 98.1 F (36.7 C)     Temp Source 01/11/21 1157 Oral     SpO2 01/11/21 1157 96 %     Weight --      Height --      Head Circumference --      Peak Flow --      Pain Score 01/11/21 1158 0     Pain Loc --      Pain Edu? --      Excl. in GC? --    No data found.  Updated Vital Signs BP 132/87 (BP Location: Left Arm)   Pulse 73   Temp 98.1 F (  36.7 C) (Oral)   Resp 18   SpO2 96%   Visual Acuity Right Eye Distance:   Left Eye Distance:   Bilateral Distance:    Right Eye Near:   Left Eye Near:    Bilateral Near:     Physical Exam Constitutional:      Appearance: Normal appearance. He is normal weight.  HENT:     Head: Normocephalic.     Right Ear: Tympanic membrane, ear canal and external ear normal.     Left Ear: Tympanic membrane, ear canal and external ear normal.     Nose: Congestion present.     Mouth/Throat:     Mouth: Mucous membranes are moist.     Pharynx: Oropharynx is clear.  Eyes:     Extraocular Movements: Extraocular movements intact.     Conjunctiva/sclera: Conjunctivae normal.     Pupils: Pupils are equal, round, and reactive to light.  Cardiovascular:     Rate and Rhythm: Normal rate and regular rhythm.     Pulses: Normal pulses.     Heart sounds: Normal heart sounds.  Pulmonary:     Effort: Pulmonary effort is normal.     Breath sounds: Normal  breath sounds.  Musculoskeletal:     Cervical back: Normal range of motion.  Skin:    General: Skin is warm.  Neurological:     Mental Status: He is alert and oriented to person, place, and time. Mental status is at baseline.  Psychiatric:        Mood and Affect: Mood normal.        Behavior: Behavior normal.     UC Treatments / Results  Labs (all labs ordered are listed, but only abnormal results are displayed) Labs Reviewed - No data to display  EKG   Radiology No results found.  Procedures Procedures (including critical care time)  Medications Ordered in UC Medications - No data to display  Initial Impression / Assessment and Plan / UC Course  I have reviewed the triage vital signs and the nursing notes.  Pertinent labs & imaging results that were available during my care of the patient were reviewed by me and considered in my medical decision making (see chart for details).  Acute bacterial sinusitis  Symptoms most likely viral however due to timeframe with no improvement will cover for bacterial and will move forward with treatment as a bacterial sinus infection, respiratory exam within normal limits, will defer chest x-ray at this time  1.  Augmentin 875/125 twice daily for 7 days   2.Tessalon 100 mg 3 times daily as needed  3.  Over-the-counter management for remaining Symptoms  Final Clinical Impressions(s) / UC Diagnoses   Final diagnoses:  Acute bacterial sinusitis     Discharge Instructions      Your symptoms are most likely viral but because you have been sick for at least 10 days I will treat you as a possible bacterial infection  Take Augmentin twice a day for the next 7 days  Can use Tessalon every 8 hours as needed for coughing  Continue use of Mucinex as needed to help with congestion    You can take Tylenol and/or Ibuprofen as needed for fever reduction and pain relief.   For cough: honey 1/2 to 1 teaspoon (you can dilute the honey in  water or another fluid).  You can also use guaifenesin and dextromethorphan for cough. You can use a humidifier for chest congestion and cough.  If you don't  have a humidifier, you can sit in the bathroom with the hot shower running.      For sore throat: try warm salt water gargles, cepacol lozenges, throat spray, warm tea or water with lemon/honey, popsicles or ice, or OTC cold relief medicine for throat discomfort.   For congestion: take a daily anti-histamine like Zyrtec, Claritin, and a oral decongestant, such as pseudoephedrine.  You can also use Flonase 1-2 sprays in each nostril daily.   It is important to stay hydrated: drink plenty of fluids (water, gatorade/powerade/pedialyte, juices, or teas) to keep your throat moisturized and help further relieve irritation/discomfort.         ED Prescriptions     Medication Sig Dispense Auth. Provider   amoxicillin-clavulanate (AUGMENTIN) 875-125 MG tablet Take 1 tablet by mouth every 12 (twelve) hours. 14 tablet Carlyn Mullenbach R, NP   benzonatate (TESSALON) 100 MG capsule Take 1 capsule (100 mg total) by mouth every 8 (eight) hours. 21 capsule Seara Hinesley, Elita Boone, NP      PDMP not reviewed this encounter.   Valinda Hoar, NP 01/11/21 1225

## 2021-01-11 NOTE — ED Triage Notes (Signed)
Pt c/o cough, post nasal drip, and runny nose for over a week. States taking OTC with some relief. States had a neg home covid test.

## 2021-01-11 NOTE — Discharge Instructions (Addendum)
Your symptoms are most likely viral but because you have been sick for at least 10 days I will treat you as a possible bacterial infection  Take Augmentin twice a day for the next 7 days  Can use Tessalon every 8 hours as needed for coughing  Continue use of Mucinex as needed to help with congestion    You can take Tylenol and/or Ibuprofen as needed for fever reduction and pain relief.   For cough: honey 1/2 to 1 teaspoon (you can dilute the honey in water or another fluid).  You can also use guaifenesin and dextromethorphan for cough. You can use a humidifier for chest congestion and cough.  If you don't have a humidifier, you can sit in the bathroom with the hot shower running.      For sore throat: try warm salt water gargles, cepacol lozenges, throat spray, warm tea or water with lemon/honey, popsicles or ice, or OTC cold relief medicine for throat discomfort.   For congestion: take a daily anti-histamine like Zyrtec, Claritin, and a oral decongestant, such as pseudoephedrine.  You can also use Flonase 1-2 sprays in each nostril daily.   It is important to stay hydrated: drink plenty of fluids (water, gatorade/powerade/pedialyte, juices, or teas) to keep your throat moisturized and help further relieve irritation/discomfort.

## 2021-03-04 ENCOUNTER — Emergency Department (HOSPITAL_COMMUNITY)
Admission: EM | Admit: 2021-03-04 | Discharge: 2021-03-04 | Disposition: A | Discharge status: Home / Self Care | Payer: BC Managed Care – PPO | Attending: Emergency Medicine | Admitting: Emergency Medicine

## 2021-03-04 ENCOUNTER — Emergency Department (HOSPITAL_COMMUNITY): Payer: BC Managed Care – PPO

## 2021-03-04 ENCOUNTER — Other Ambulatory Visit: Payer: Self-pay

## 2021-03-04 DIAGNOSIS — Z79899 Other long term (current) drug therapy: Secondary | ICD-10-CM | POA: Insufficient documentation

## 2021-03-04 DIAGNOSIS — Z23 Encounter for immunization: Secondary | ICD-10-CM | POA: Insufficient documentation

## 2021-03-04 DIAGNOSIS — Z87891 Personal history of nicotine dependence: Secondary | ICD-10-CM | POA: Diagnosis not present

## 2021-03-04 DIAGNOSIS — S0003XA Contusion of scalp, initial encounter: Secondary | ICD-10-CM | POA: Diagnosis not present

## 2021-03-04 DIAGNOSIS — R569 Unspecified convulsions: Secondary | ICD-10-CM | POA: Diagnosis present

## 2021-03-04 DIAGNOSIS — W01198A Fall on same level from slipping, tripping and stumbling with subsequent striking against other object, initial encounter: Secondary | ICD-10-CM | POA: Diagnosis not present

## 2021-03-04 DIAGNOSIS — E876 Hypokalemia: Secondary | ICD-10-CM | POA: Insufficient documentation

## 2021-03-04 DIAGNOSIS — Y92000 Kitchen of unspecified non-institutional (private) residence as  the place of occurrence of the external cause: Secondary | ICD-10-CM | POA: Insufficient documentation

## 2021-03-04 LAB — URINALYSIS, ROUTINE W REFLEX MICROSCOPIC
Bacteria, UA: NONE SEEN
Bilirubin Urine: NEGATIVE
Glucose, UA: NEGATIVE mg/dL
Ketones, ur: NEGATIVE mg/dL
Leukocytes,Ua: NEGATIVE
Nitrite: NEGATIVE
Protein, ur: NEGATIVE mg/dL
Specific Gravity, Urine: 1.018 (ref 1.005–1.030)
pH: 5 (ref 5.0–8.0)

## 2021-03-04 LAB — CBC

## 2021-03-04 LAB — BASIC METABOLIC PANEL
Anion gap: 10 (ref 5–15)
BUN: 11 mg/dL (ref 6–20)
CO2: 12 mmol/L — ABNORMAL LOW (ref 22–32)
Calcium: 6.4 mg/dL — CL (ref 8.9–10.3)
Chloride: 116 mmol/L — ABNORMAL HIGH (ref 98–111)
Creatinine, Ser: 0.86 mg/dL (ref 0.61–1.24)
GFR, Estimated: >60 mL/min (ref >60)
Glucose, Bld: 122 mg/dL — ABNORMAL HIGH (ref 70–99)
Potassium: 2.9 mmol/L — ABNORMAL LOW (ref 3.5–5.1)
Sodium: 138 mmol/L (ref 135–145)

## 2021-03-04 LAB — CBG MONITORING, ED: Glucose-Capillary: 92 mg/dL (ref 70–99)

## 2021-03-04 LAB — RAPID URINE DRUG SCREEN, HOSP PERFORMED
Amphetamines: NOT DETECTED
Barbiturates: NOT DETECTED
Benzodiazepines: NOT DETECTED
Cocaine: NOT DETECTED
Opiates: NOT DETECTED
Tetrahydrocannabinol: POSITIVE — AB

## 2021-03-04 LAB — MAGNESIUM: Magnesium: 1.8 mg/dL (ref 1.7–2.4)

## 2021-03-04 LAB — TROPONIN I (HIGH SENSITIVITY): Troponin I (High Sensitivity): 10 ng/L (ref <18)

## 2021-03-04 MED ORDER — TETANUS-DIPHTH-ACELL PERTUSSIS 5-2.5-18.5 LF-MCG/0.5 IM SUSY
0.5000 mL | PREFILLED_SYRINGE | Freq: Once | INTRAMUSCULAR | Status: AC
  Administered 2021-03-04: 0.5 mL via INTRAMUSCULAR
  Filled 2021-03-04: qty 0.5

## 2021-03-04 MED ORDER — POTASSIUM CHLORIDE CRYS ER 20 MEQ PO TBCR
40.0000 meq | EXTENDED_RELEASE_TABLET | Freq: Once | ORAL | Status: AC
  Administered 2021-03-04: 40 meq via ORAL
  Filled 2021-03-04: qty 2

## 2021-03-04 MED ORDER — POTASSIUM CHLORIDE ER 20 MEQ PO TBCR: 40.0000 meq | EXTENDED_RELEASE_TABLET | Freq: Every day | ORAL | 0 refills | Status: DC

## 2021-03-04 MED ORDER — KETOROLAC TROMETHAMINE 15 MG/ML IJ SOLN
15.0000 mg | Freq: Once | INTRAMUSCULAR | Status: AC
  Administered 2021-03-04: 15 mg via INTRAVENOUS
  Filled 2021-03-04: qty 1

## 2021-03-04 MED ORDER — ACETAMINOPHEN 500 MG PO TABS
1000.0000 mg | ORAL_TABLET | Freq: Once | ORAL | Status: AC
  Administered 2021-03-04: 1000 mg via ORAL
  Filled 2021-03-04: qty 2

## 2021-03-04 MED ORDER — CALCIUM GLUCONATE 500 MG PO TABS: 1.0000 | ORAL_TABLET | Freq: Three times a day (TID) | ORAL | 0 refills | Status: DC

## 2021-03-04 MED ORDER — CALCIUM GLUCONATE-NACL 1-0.675 GM/50ML-% IV SOLN: 1.0000 g | Freq: Once | INTRAVENOUS | Status: DC

## 2021-03-04 MED ORDER — LACTATED RINGERS IV BOLUS
1000.0000 mL | Freq: Once | INTRAVENOUS | Status: AC
  Administered 2021-03-04: 1000 mL via INTRAVENOUS

## 2021-03-04 MED ORDER — CALCIUM CARBONATE 1250 (500 CA) MG PO TABS
1.0000 | ORAL_TABLET | Freq: Once | ORAL | Status: AC
  Administered 2021-03-04: 500 mg via ORAL
  Filled 2021-03-04: qty 1

## 2021-03-04 NOTE — ED Notes (Signed)
Walked into pt's room after ED NT-Nikki called for assistance. Pt wants to leave AMA, pt states he has a good understand of what's going on and what he needs to do. Pt says he needs to start eating better and has been waiting for a long time and does not feel he needs to stay any longer. ED provider for pt called, spoke to pt and pt verbalized understanding of risks and dangers of going home AMA tonight. Pt agrees to stay for oral meds and AMA paperwork and will also f/u with PCP and get more bloodwork done.

## 2021-03-04 NOTE — ED Provider Notes (Signed)
Patient signed out to me by previous provider. Please refer to their note for full HPI.  Briefly this is a 35 year old male who presents the emergency department with syncope versus seizure.  Patient reportedly had whole body shaking.  No history of epilepsy.  Positive head injury and loss of consciousness, patient has amnesia to the event.  Head CT and cervical spine CT are negative but we are pending lab evaluation. Physical Exam  BP 104/75 (BP Location: Right Arm)   Pulse 68   Temp 99 F (37.2 C) (Oral)   Resp 19   Ht 5\' 6"  (1.676 m)   Wt 98.4 kg   SpO2 99%   BMI 35.02 kg/m   Physical Exam Vitals and nursing note reviewed.  Constitutional:      Appearance: Normal appearance.  HENT:     Head: Normocephalic.     Comments: Scalp hematoma    Mouth/Throat:     Mouth: Mucous membranes are moist.  Cardiovascular:     Rate and Rhythm: Normal rate.  Pulmonary:     Effort: Pulmonary effort is normal. No respiratory distress.  Abdominal:     Palpations: Abdomen is soft.     Tenderness: There is no abdominal tenderness.  Musculoskeletal:        General: No deformity.     Cervical back: No rigidity or tenderness.  Skin:    General: Skin is warm.  Neurological:     Mental Status: He is alert and oriented to person, place, and time. Mental status is at baseline.     Motor: No weakness.  Psychiatric:        Mood and Affect: Mood normal.    ED Course/Procedures     Procedures  MDM   Blood work reveals hypokalemia and severe hypocalcemia.  In the setting of syncope, possible arrhythmia and or seizure these critical findings warrant IV replacement and admission.  However patient is refusing admission.  He is refusing IV therapy as well.  I will transition his potassium and calcium to oral.  I have made multiple times to convince the patient to stay.  He is polite and cooperative but refusing to stay in the hospital.  I have recommended that the patient gets admitted to the hospital  for treatment and completion of their workup however they decline.  I have expressed the importance of staying including the risks of leaving which include worsening condition, arrhythmia, syncope, seizure, permanent disability and death.  Patient accepts these risks.  They are alert and oriented, they have capacity to make this decision.  I have not been able to convince the patient to stay and they understand the risks of leaving AGAINST MEDICAL ADVICE.   Patient took the p.o. doses of potassium and calcium in the department.  He did not wait long enough for to get a magnesium.  EKG at this time is showing normal sinus rhythm.  I have sent him a prescription for oral ad potassium/calcium.  I encouraged him to take in the follow-up with his primary doctor for repeat blood work.  He understands.        [place], DO 03/04/21 2039

## 2021-03-04 NOTE — ED Triage Notes (Signed)
BIB [facility] after wife called to report pt having a seizure approx. Lasting 5-7 minutes. Per EMS, wife reports pt was in kitchen when he suddenly fell back hitting his head. Wife endorses full body seizing and foaming of mouth. Tongue trauma present. Large hematoma on back of head. No previous hx of seizing.   Hx: psoriasis

## 2021-03-04 NOTE — ED Provider Notes (Signed)
[facility] EMERGENCY DEPARTMENT Provider Note   CSN: [id number] Arrival date & time: 03/04/21  1258     History Chief Complaint  Patient presents with   Seizures    Darren Ramirez is a 35 y.o. male.   Seizures Patient is a 35 year old male with history of psoriatic arthritis who presents for a fall and seizure-like activity.  Fall occurred shortly prior to arrival.  He was standing in the kitchen drinking a slurpee.  He noted a brain freeze and subsequently lost consciousness.  He fell backwards, striking the back of his head on the kitchen floor.  This was witnessed by his sister.  Patient's sister reports that he laid motionless on the ground initially but subsequently had seizure-like movements in his lower extremities.  Patient reports that he came to when he was on the stretcher, after being loaded by EMS.  He denies any history of seizures or syncope.  He denies any history of cardiac conditions.  He denies any alcohol or illicit drug use.  Earlier this morning, patient did undergo a Remicade infusion.  He has had Remicade infusions in the past without any adverse reactions.  He states that, prior to this episode, he was in his normal state of health.  Currently, patient endorses a generalized headache.  He denies any neck pain.  He denies any other areas of discomfort.    Past Medical History:  Diagnosis Date   Anemia    Arthralgia    Seasonal allergies    Tinea pedis     Patient Active Problem List   Diagnosis Date Noted   Pustule 01/23/2020   Cervical radiculopathy 11/23/2017   Radicular pain in left arm 04/13/2016   Paresthesia of right leg 10/10/2015   Chest pain of unknown etiology 10/07/2015   Upper respiratory tract infection 06/25/2013   TINEA PEDIS 10/05/2009   HYPERCHOLESTEROLEMIA 11/20/2008   Psoriatic arthritis (HCC) 11/20/2008   MICROCYTOSIS 11/20/2008    No past surgical history on file.     Family History  Problem Relation Age  of Onset   Hyperlipidemia Father    Sleep apnea Father    Thyroid disease Father     Social History   Tobacco Use   Smoking status: Former    Types: Cigars    Quit date: 06/05/2010    Years since quitting: 10.7   Smokeless tobacco: Never  Substance Use Topics   Alcohol use: Yes    Comment: 1-2 times per month   Drug use: No    Home Medications Prior to Admission medications   Medication Sig Start Date End Date Taking? Authorizing Provider  calcium gluconate 500 MG tablet Take 1 tablet (500 mg total) by mouth 3 (three) times daily for 7 days. 03/04/21 03/11/21 Yes Horton, Clabe Seal, DO  clobetasol (TEMOVATE) 0.05 % GEL Apply 1 application topically daily as needed (For dry skin). 10/15/20  Yes [provider]  dextromethorphan-guaiFENesin (MUCINEX DM) 30-600 MG 12hr tablet Take 1 tablet by mouth 2 (two) times daily as needed for cough.   Yes [provider]  inFLIXimab (REMICADE) 100 MG injection Inject 100 mg into the vein See admin instructions. Inject 100 mg intravenous every 7 to 8 weeks   Yes [provider]  potassium chloride 20 MEQ TBCR Take 40 mEq by mouth daily for 5 days. 03/04/21 03/09/21 Yes Horton, Clabe Seal, DO  amoxicillin-clavulanate (AUGMENTIN) 875-125 MG tablet Take 1 tablet by mouth every 12 (twelve) hours. Patient not taking: Reported  on 03/04/2021 01/11/21   Valinda Hoar, NP  benzonatate (TESSALON) 100 MG capsule Take 1 capsule (100 mg total) by mouth every 8 (eight) hours. Patient not taking: Reported on 03/04/2021 01/11/21   Valinda Hoar, NP    Allergies    Patient has no known allergies.  Review of Systems   Review of Systems  Constitutional:  Negative for appetite change, chills, diaphoresis, fatigue and fever.  HENT:  Negative for congestion, ear pain and sore throat.   Eyes:  Negative for photophobia, pain, redness and visual disturbance.  Respiratory:  Negative for cough, chest tightness, shortness of breath and wheezing.    Cardiovascular:  Negative for chest pain and palpitations.  Gastrointestinal:  Negative for abdominal pain, diarrhea, nausea and vomiting.  Genitourinary:  Negative for dysuria, flank pain and hematuria.  Musculoskeletal:  Negative for arthralgias, back pain, joint swelling, myalgias and neck pain.  Skin:  Positive for wound. Negative for color change and rash.  Neurological:  Positive for seizures and headaches. Negative for dizziness, syncope, speech difficulty, weakness and numbness.  Hematological:  Does not bruise/bleed easily.  Psychiatric/Behavioral:  Positive for confusion. Negative for decreased concentration.   All other systems reviewed and are negative.  Physical Exam Updated Vital Signs BP 104/75 (BP Location: Right Arm)   Pulse 68   Temp 99 F (37.2 C) (Oral)   Resp 19   Ht 5\' 6"  (1.676 m)   Wt 98.4 kg   SpO2 99%   BMI 35.02 kg/m   Physical Exam Vitals and nursing note reviewed.  Constitutional:      General: He is not in acute distress.    Appearance: Normal appearance. He is well-developed and normal weight. He is not ill-appearing, toxic-appearing or diaphoretic.  HENT:     Head: Normocephalic.     Comments: Hematoma to left occipital scalp.  Small amount of overlying skin maceration.  Hemostatic.    Right Ear: External ear normal.     Left Ear: External ear normal.     Nose: Nose normal. No congestion.     Mouth/Throat:     Mouth: Mucous membranes are moist.     Comments: Evidence of tongue bite on right side of tongue.  Superficial wound currently hemostatic. Eyes:     General: No visual field deficit.    Extraocular Movements: Extraocular movements intact.     Conjunctiva/sclera: Conjunctivae normal.  Neck:     Comments: Cervical collar in place Cardiovascular:     Rate and Rhythm: Normal rate and regular rhythm.     Heart sounds: No murmur heard. Pulmonary:     Effort: Pulmonary effort is normal. No respiratory distress.     Breath sounds: Normal  breath sounds. No wheezing.  Chest:     Chest wall: No tenderness.  Abdominal:     Palpations: Abdomen is soft.     Tenderness: There is no abdominal tenderness.  Musculoskeletal:        General: No deformity. Normal range of motion.     Cervical back: Neck supple. No tenderness.     Right lower leg: No edema.     Left lower leg: No edema.  Skin:    General: Skin is warm and dry.     Capillary Refill: Capillary refill takes less than 2 seconds.     Coloration: Skin is not jaundiced or pale.  Neurological:     General: No focal deficit present.     Mental Status: He is alert and  oriented to person, place, and time.     Cranial Nerves: Cranial nerves are intact. No cranial nerve deficit, dysarthria or facial asymmetry.     Sensory: Sensation is intact. No sensory deficit.     Motor: Motor function is intact. No weakness, abnormal muscle tone or pronator drift.     Coordination: Coordination is intact. Finger-Nose-Finger Test normal.  Psychiatric:        Mood and Affect: Mood normal.        Behavior: Behavior normal.        Thought Content: Thought content normal.        Judgment: Judgment normal.    ED Results / Procedures / Treatments   Labs (all labs ordered are listed, but only abnormal results are displayed) Labs Reviewed  URINALYSIS, ROUTINE W REFLEX MICROSCOPIC - Abnormal; Notable for the following components:      Result Value   Hgb urine dipstick SMALL (*)    All other components within normal limits  RAPID URINE DRUG SCREEN, HOSP PERFORMED - Abnormal; Notable for the following components:   Tetrahydrocannabinol POSITIVE (*)    All other components within normal limits  BASIC METABOLIC PANEL - Abnormal; Notable for the following components:   Potassium 2.9 (*)    Chloride 116 (*)    CO2 12 (*)    Glucose, Bld 122 (*)    Calcium 6.4 (*)    All other components within normal limits  CBC  MAGNESIUM  CBG MONITORING, ED  TROPONIN I (HIGH SENSITIVITY)  TROPONIN I  (HIGH SENSITIVITY)    EKG EKG Interpretation  Date/Time:  Friday March 04 2021 13:12:44 EDT Ventricular Rate:  66 PR Interval:  164 QRS Duration: 127 QT Interval:  403 QTC Calculation: 423 R Axis:   41 Text Interpretation: Sinus rhythm IVCD, consider atypical RBBB ST elev, probable normal early repol pattern NSR Confirmed by Coralee Pesa [phone]) on 03/04/2021 4:56:22 PM  Radiology CT HEAD WO CONTRAST  Result Date: 03/04/2021 CLINICAL DATA:  Seizure and fall.  Hit the back of his head. EXAM: CT HEAD WITHOUT CONTRAST CT CERVICAL SPINE WITHOUT CONTRAST TECHNIQUE: Multidetector CT imaging of the head and cervical spine was performed following the standard protocol without intravenous contrast. Multiplanar CT image reconstructions of the cervical spine were also generated. COMPARISON:  None. FINDINGS: CT HEAD FINDINGS Brain: No evidence of acute infarction, hemorrhage, hydrocephalus, extra-axial collection or mass lesion/mass effect. Vascular: No hyperdense vessel or unexpected calcification. Skull: Normal. Negative for fracture or focal lesion. Sinuses/Orbits: No acute finding. Other: Moderate left occipital scalp hematoma. CT CERVICAL SPINE FINDINGS Alignment: No traumatic malalignment. Mild reversal of the normal cervical lordosis. Skull base and vertebrae: No acute fracture. No primary bone lesion or focal pathologic process. Soft tissues and spinal canal: No prevertebral fluid or swelling. No visible canal hematoma. Disc levels: Mild disc height loss at C6-C7 and C7-T1. Moderate left neuroforaminal stenosis at C6-C7 due to uncovertebral hypertrophy. Upper chest: Negative. Other: None. IMPRESSION: 1. No acute intracranial abnormality. Moderate left occipital scalp hematoma. 2. No acute cervical spine fracture or traumatic malalignment. Electronically Signed   By: Obie Dredge M.D.   On: 03/04/2021 15:11   CT CERVICAL SPINE WO CONTRAST  Result Date: 03/04/2021 CLINICAL DATA:  Seizure and  fall.  Hit the back of his head. EXAM: CT HEAD WITHOUT CONTRAST CT CERVICAL SPINE WITHOUT CONTRAST TECHNIQUE: Multidetector CT imaging of the head and cervical spine was performed following the standard protocol without intravenous contrast. Multiplanar CT image reconstructions  of the cervical spine were also generated. COMPARISON:  None. FINDINGS: CT HEAD FINDINGS Brain: No evidence of acute infarction, hemorrhage, hydrocephalus, extra-axial collection or mass lesion/mass effect. Vascular: No hyperdense vessel or unexpected calcification. Skull: Normal. Negative for fracture or focal lesion. Sinuses/Orbits: No acute finding. Other: Moderate left occipital scalp hematoma. CT CERVICAL SPINE FINDINGS Alignment: No traumatic malalignment. Mild reversal of the normal cervical lordosis. Skull base and vertebrae: No acute fracture. No primary bone lesion or focal pathologic process. Soft tissues and spinal canal: No prevertebral fluid or swelling. No visible canal hematoma. Disc levels: Mild disc height loss at C6-C7 and C7-T1. Moderate left neuroforaminal stenosis at C6-C7 due to uncovertebral hypertrophy. Upper chest: Negative. Other: None. IMPRESSION: 1. No acute intracranial abnormality. Moderate left occipital scalp hematoma. 2. No acute cervical spine fracture or traumatic malalignment. Electronically Signed   By: Obie Dredge M.D.   On: 03/04/2021 15:11    Procedures Procedures   Medications Ordered in ED Medications  lactated ringers bolus 1,000 mL (0 mLs Intravenous Stopped 03/04/21 1621)  Tdap (BOOSTRIX) injection 0.5 mL (0.5 mLs Intramuscular Given 03/04/21 1439)  acetaminophen (TYLENOL) tablet 1,000 mg (1,000 mg Oral Given 03/04/21 1515)  ketorolac (TORADOL) 15 MG/ML injection 15 mg (15 mg Intravenous Given 03/04/21 1623)  potassium chloride SA (KLOR-CON) CR tablet 40 mEq (40 mEq Oral Given 03/04/21 2057)  calcium carbonate (OS-CAL - dosed in mg of elemental calcium) tablet 500 mg of elemental  calcium (500 mg of elemental calcium Oral Given 03/04/21 2056)    ED Course  I have reviewed the triage vital signs and the nursing notes.  Pertinent labs & imaging results that were available during my care of the patient were reviewed by me and considered in my medical decision making (see chart for details).    MDM Rules/Calculators/A&P                          Patient is a 35 year old male with no history of epilepsy presenting for a fall followed by seizure-like activity.  On arrival in the ED, he is alert and oriented.  It does appear that he is amnestic to the event.  He is accompanied initially by his father and subsequently by his sister at bedside.  Patient's sister did witness the event.  From his sister's history, it does appear that the fall preceded the seizure-like activity.  I suspect a concussion induced seizure.  Will obtain CT scan of head and cervical spine to assess for acute injuries.  We will also obtain lab work to assess for underlying reasons for syncope and/or seizure.  Patient's posterior scalp hematoma is currently hemostatic and there is no laceration that will need closure.  Tetanus was updated.  IV fluids were given.  Patient was given Tylenol for relief of headache pain.  Results of CT scan were negative for acute intracranial injury.  Toradol was given for further analgesia.  Patient remained alert and oriented in the ED.  He denied any new symptoms.  At time of signout, results of lab work were pending.  Care of patient was signed out to oncoming ED provider.  Final Clinical Impression(s) / ED Diagnoses Final diagnoses:  Seizure (HCC)  Hypokalemia  Hypocalcemia    Rx / DC Orders ED Discharge Orders          Ordered    potassium chloride 20 MEQ TBCR  Daily        03/04/21 2033  calcium gluconate 500 MG tablet  3 times daily        03/04/21 2033             Gloris Manchester, MD 03/05/21 2152

## 2021-03-04 NOTE — Discharge Instructions (Addendum)
You have chosen to leave the emergency department AGAINST MEDICAL ADVICE.  I have explained to you your testing results including critically low potassium and calcium and the need for further evaluation/admission.  You have verbalized understanding of these results and plan and are choosing to leave the hospital AGAINST MEDICAL ADVICE. You understand the risks associated with leaving without further evaluation/treatment including cardiac arrhythmia, passing out, seizure, death.  It is extremely important that you take your potassium and calcium supplements for the next week, follow-up with your primary doctor for repeat blood work.  If you have any worsening symptoms or choose to be treated please return immediately to emergency department.

## 2021-03-08 ENCOUNTER — Encounter: Payer: Self-pay | Admitting: Neurology

## 2021-03-08 ENCOUNTER — Ambulatory Visit (INDEPENDENT_AMBULATORY_CARE_PROVIDER_SITE_OTHER): Payer: BC Managed Care – PPO

## 2021-03-08 ENCOUNTER — Other Ambulatory Visit: Payer: Self-pay

## 2021-03-08 ENCOUNTER — Encounter: Payer: Self-pay | Admitting: Internal Medicine

## 2021-03-08 ENCOUNTER — Ambulatory Visit (INDEPENDENT_AMBULATORY_CARE_PROVIDER_SITE_OTHER): Payer: BC Managed Care – PPO | Admitting: Internal Medicine

## 2021-03-08 ENCOUNTER — Ambulatory Visit (INDEPENDENT_AMBULATORY_CARE_PROVIDER_SITE_OTHER): Payer: BC Managed Care – PPO | Admitting: Neurology

## 2021-03-08 VITALS — BP 118/71 | HR 71 | Ht 67.0 in | Wt 217.0 lb

## 2021-03-08 DIAGNOSIS — G40909 Epilepsy, unspecified, not intractable, without status epilepticus: Secondary | ICD-10-CM

## 2021-03-08 DIAGNOSIS — R7989 Other specified abnormal findings of blood chemistry: Secondary | ICD-10-CM | POA: Diagnosis not present

## 2021-03-08 NOTE — Progress Notes (Signed)
Subjective:    Patient ID: Darren Ramirez, male   DOB: [birth year]   MRN: [id number]    Brief patient profile:  35 yobm with h/o seasonal allergies controlled with otc's just in spring with pollen dx'd with  psoriatic arthritis 2011 followed at [facility]/ [facility]     History of Present Illness  07/07/2018 acute extended ov/[name] re: ? flu Chief Complaint  Patient presents with   Acute Visit    woke up with body aches, HA, nausea, chills, sweats.   acutely ill am of ov with above symptoms really not much cough and no sob. Father admitted with pos Influenza A 07/03/18 (confirmed)  rec No work until Jul 08 2018 tamiflu 75 mg twice daily x 5 days Tylenol for pain or fever/ lots of fluids and broth based soups    07/16/2018  f/u ov/[name] re:  cough Chief Complaint  Patient presents with   Follow-up    Pt states he is hoarse, has congestion in chest, and also rattling in chest. Pt states he is coughing but no mucus.   Dyspnea:  Not limited by breathing from desired activities  But no aerobics Cough: slt yellow in am then all day non prod but rattling quality with sense of chest congestion  Sleeping: able to sleep ok, esp p mucinex  SABA use: none  02: none  rec For cough >  mucinex dm 1200 mg every 12hours as needed zpak  Depomedrol mg IM  Today      08/20/19   Te[facility]: Acute Sinusitis: - Nasal congestion with yellow mucus x 3-4 days - Continue Mucinex DM; advised flonase nasal spray daily  - RX zpack, hold off on steriods     01/14/20 phone care ? 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    03/08/2021  acute ov  p ? Sz  on 9/30 /22  p er eval and neuro initial w/u in progress [facility])  Chief Complaint  Patient presents with   Acute Visit    Patient reports that he had a seizure and is concerned that is may be affecting his breathing. a  Brain freeze from slushy then passed out and bit tongue and not much of a post ictal ams and then 5 min p vomited (fully alert at that point) . ER eval but left ama  (low K and calcium p event > eval by [facility] who repeated them 10/4 pending     Since eR eval . Still with HA but improving, says minimal brain "fog"  Tongue not swollen, no problem swallowing Worked 10/3 but not back working at [employer]  on hold per neuro    No obvious day to day or daytime variability or assoc excess/ purulent sputum or mucus plugs or hemoptysis or cp or chest tightness, subjective wheeze or overt sinus or  hb symptoms.   Sleeping ok now  without nocturnal  or early am exacerbation  of respiratory  c/o's or need for noct saba. Also denies any obvious fluctuation of symptoms with weather or environmental changes or other aggravating or alleviating factors except as outlined above   No unusual exposure hx or h/o childhood pna/ asthma or knowledge of premature birth.  Current Allergies, Complete Past Medical History, Past Surgical History, Family History, and Social History were reviewed in [employer] record.  ROS  The following are not active complaints unless bolded Hoarseness, sore throat, dysphagia, dental problems, itching, sneezing,  nasal congestion or discharge of excess mucus or purulent secretions, ear ache,   fever, chills, sweats, unintended wt loss or wt gain, classically pleuritic or exertional cp,  orthopnea pnd or arm/hand swelling  or leg swelling, presyncope,  palpitations, abdominal pain, anorexia, nausea, vomiting, diarrhea  or change in bowel habits or change in bladder habits, change in stools or change in urine, dysuria, hematuria,  rash, arthralgias, visual complaints, headache improving , numbness, weakness or ataxia or problems with walking or coordination,  change in mood or  memory.          Current Meds  Medication Sig   calcium gluconate 500 MG tablet Take 1 tablet (500 mg total) by mouth 3 (three) times daily for 7 days.   cetirizine (ZYRTEC) 10 MG tablet Take 10 mg by mouth daily.   clobetasol (TEMOVATE) 0.05 % GEL Apply 1 application topically daily as needed (For dry skin).   dextromethorphan-guaiFENesin (MUCINEX DM) 30-600 MG 12hr tablet Take 1 tablet by mouth 2 (two) times daily as needed for cough.   inFLIXimab (REMICADE) 100 MG injection Inject 100 mg into the vein See admin instructions. Inject 100 mg intravenous every 7 to 8 weeks   potassium chloride 20 MEQ TBCR Take 40 mEq by mouth daily for 5 days.               Past History:  Psoriatic arthritis .....................................................    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            Objective:   Physical Exam   03/08/2021       217   01/23/20 208 lb 12.8 oz (94.7 kg)  07/16/18 209 lb 12.8 oz (95.2 kg)  07/04/18 211 lb (95.7 kg)       Vital signs reviewed  03/08/2021  - Note at rest 02 sats  98% on RA   General appearance:    amb bm nad /no obvious trauma    HEENT : pt wearing mask not removed for exam due to covid -19 concerns.    NECK :  without JVD/Nodes/TM/ nl carotid upstrokes bilaterally   LUNGS: no acc muscle use,  Nl contour chest which is clear to A and P bilaterally  without cough on insp or exp maneuvers   CV:  RRR  no s3 or murmur or increase in P2, and no edema   ABD:  soft and nontender with nl inspiratory excursion in the supine position. No bruits or organomegaly appreciated, bowel sounds nl  MS:  Nl gait/ ext warm without deformities, calf tenderness, cyanosis or clubbing No obvious joint restrictions   SKIN:  warm and dry without lesions    NEURO:  alert, approp, nl sensorium with  no motor or cerebellar deficits apparent.            CXR PA and Lateral:   03/08/2021 :    I personally reviewed images  / impression as follows:     No acute changes       Assessment:

## 2021-03-08 NOTE — Progress Notes (Signed)
Chief Complaint  Patient presents with   New Patient (Initial Visit)    ED fu for seizures, with mother, today c/o fogginess, states he blacked out, fall, and hit his head when sz occurred       ASSESSMENT AND PLAN  Darren Ramirez is a 35 y.o. male   Seizure on March 04, 2021  Normal neurological examination,  CT head without contrast  Complete evaluation with MRI of the brain with without contrast  EEG  First generalized seizure, will not start AED,  No driving until seizure-free for 6 months   DIAGNOSTIC DATA (LABS, IMAGING, TESTING) - I reviewed patient records, labs, notes, testing and imaging myself where available.   MEDICAL HISTORY:  Darren Ramirez is a 35 year old male, seen in request by his primary care nurse practitioner in the [place], Allyne Gee and Dr. Nyoka Cowden, for evaluation of seizure, initial evaluation was on March 08, 2021  I reviewed and summarized the referring note. PMHx. 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[employer], [employer] packaging at nighttime, including heavy lifting, usually gets 6 to 7 hours sleep, get up early to drop his children to school  On March 04, 2021, he only had slight breath first, then took his usual Remicade infusion, he brought big lunch back home, he ate his lunch with a cold drink, then he developed this brain freeze, bilateral frontal headache, closed his eyes, then he lost consciousness, was witnessed by his wife fell hard backwards on the floor, with big bruise at occipital region, right lateral tongue biting, body jerking movement, lasting for few minutes, when paramedic checked on him few minutes later, he was noted to have elevated blood pressure 200/100, confusion, does not know his name, date of birth,  Since the event, he had intermittent brain foggy sensation, wooziness, still has occipital area pain where he had the big  bruise  PHYSICAL EXAM:   Vitals:   03/08/21 0819  BP: 118/71  Pulse: 71  Weight: 217 lb (98.4 kg)  Height: 5\' 7"  (1.702 m)   Not recorded     Body mass index is 33.99 kg/m.  PHYSICAL EXAMNIATION:  Gen: NAD, conversant, well nourised, well groomed                     Cardiovascular: Regular rate rhythm, no peripheral edema, warm, nontender. Eyes: Conjunctivae clear without exudates or hemorrhage Neck: Supple, no carotid bruits. Pulmonary: Clear to auscultation bilaterally   NEUROLOGICAL EXAM:  MENTAL STATUS: Speech:    Speech is normal; fluent and spontaneous with normal comprehension.  Cognition:     Orientation to time, place and person     Normal recent and remote memory     Normal Attention span and concentration     Normal Language, naming, repeating,spontaneous speech     Fund of knowledge   CRANIAL NERVES: CN II: Visual fields are full to confrontation. Pupils are round equal and briskly reactive to light. CN III, IV, VI: extraocular movement are normal. No ptosis. CN V: Facial sensation is intact to light touch CN VII: Face is symmetric with normal eye closure  CN VIII: Hearing is normal to causal conversation. CN IX, X: Phonation is normal. CN XI: Head turning and shoulder shrug are intact  MOTOR: There is no pronator drift of out-stretched arms. Muscle bulk and tone are normal. Muscle strength is normal.  REFLEXES: Reflexes are 2+ and symmetric at  the biceps, triceps, knees, and ankles. Plantar responses are flexor.  SENSORY: Intact to light touch, pinprick and vibratory sensation are intact in fingers and toes.  COORDINATION: There is no trunk or limb dysmetria noted.  GAIT/STANCE: Posture is normal. Gait is steady with normal steps, base, arm swing, and turning. Heel and toe walking are normal. Tandem gait is normal.  Romberg is absent.  REVIEW OF SYSTEMS:  Full 14 system review of systems performed and notable only for as above All other  review of systems were negative.   ALLERGIES: No Known Allergies  HOME MEDICATIONS: Current Outpatient Medications  Medication Sig Dispense Refill   calcium gluconate 500 MG tablet Take 1 tablet (500 mg total) by mouth 3 (three) times daily for 7 days. 21 tablet 0   clobetasol (TEMOVATE) 0.05 % GEL Apply 1 application topically daily as needed (For dry skin).     dextromethorphan-guaiFENesin (MUCINEX DM) 30-600 MG 12hr tablet Take 1 tablet by mouth 2 (two) times daily as needed for cough.     inFLIXimab (REMICADE) 100 MG injection Inject 100 mg into the vein See admin instructions. Inject 100 mg intravenous every 7 to 8 weeks     potassium chloride 20 MEQ TBCR Take 40 mEq by mouth daily for 5 days. 10 tablet 0   No current facility-administered medications for this visit.    PAST MEDICAL HISTORY: Past Medical History:  Diagnosis Date   Anemia    Arthralgia    Seasonal allergies    Tinea pedis     PAST SURGICAL HISTORY: History reviewed. No pertinent surgical history.  FAMILY HISTORY: Family History  Problem Relation Age of Onset   Hyperlipidemia Father    Sleep apnea Father    Thyroid disease Father     SOCIAL HISTORY: Social History   Socioeconomic History   Marital status: Single    Spouse name: Not on file   Number of children: Not on file   Years of education: Not on file   Highest education level: Not on file  Occupational History   Occupation: Holiday representative  Tobacco Use   Smoking status: Former    Types: Cigars    Quit date: 06/05/2010    Years since quitting: 10.7   Smokeless tobacco: Never  Substance and Sexual Activity   Alcohol use: Yes    Comment: 1-2 times per month   Drug use: No   Sexual activity: Not on file  Other Topics Concern   Not on file  Social History Narrative   Not on file   Social Determinants of Health   Financial Resource Strain: Not on file  Food Insecurity: Not on file  Transportation Needs: Not on file  Physical  Activity: Not on file  Stress: Not on file  Social Connections: Not on file  Intimate Partner Violence: Not on file      Levert Feinstein, M.D. Ph.D.  [facility] [address], [address] [place], [place] [address] Ph: [phone] Fax: [phone]  CC:  Olive Bass, FNP [address] [place],  [place] [address]  Nyoka Cowden, MD

## 2021-03-08 NOTE — Patient Instructions (Signed)
Please remember to go to the  x-ray department  for your tests - we will call you with the results when they are available    Pulmonary follow up is as needed 

## 2021-03-09 ENCOUNTER — Encounter: Payer: Self-pay | Admitting: Internal Medicine

## 2021-03-09 ENCOUNTER — Telehealth: Payer: Self-pay | Admitting: Neurology

## 2021-03-09 LAB — COMPREHENSIVE METABOLIC PANEL
ALT: 30 IU/L (ref 0–44)
AST: 17 IU/L (ref 0–40)
Albumin/Globulin Ratio: 1.6 (ref 1.2–2.2)
Albumin: 4.8 g/dL (ref 4.0–5.0)
Alkaline Phosphatase: 66 IU/L (ref 44–121)
BUN/Creatinine Ratio: 14 (ref 9–20)
BUN: 14 mg/dL (ref 6–20)
Bilirubin Total: 0.3 mg/dL (ref 0.0–1.2)
CO2: 23 mmol/L (ref 20–29)
Calcium: 9.7 mg/dL (ref 8.7–10.2)
Chloride: 102 mmol/L (ref 96–106)
Creatinine, Ser: 1.03 mg/dL (ref 0.76–1.27)
Globulin, Total: 3 g/dL (ref 1.5–4.5)
Glucose: 93 mg/dL (ref 70–99)
Potassium: 4.8 mmol/L (ref 3.5–5.2)
Sodium: 140 mmol/L (ref 134–144)
Total Protein: 7.8 g/dL (ref 6.0–8.5)
eGFR: 98 mL/min/{1.73_m2} (ref >59)

## 2021-03-09 LAB — CALCIUM, IONIZED: Calcium, Ion: 5.2 mg/dL (ref 4.5–5.6)

## 2021-03-09 LAB — VITAMIN D 25 HYDROXY (VIT D DEFICIENCY, FRACTURES): Vit D, 25-Hydroxy: 16.7 ng/mL — ABNORMAL LOW (ref 30.0–100.0)

## 2021-03-09 LAB — TSH: TSH: 0.94 u[IU]/mL (ref 0.450–4.500)

## 2021-03-09 NOTE — Telephone Encounter (Signed)
pt has [facility] faxed the order they will reach out to the patient to schedule

## 2021-03-09 NOTE — Assessment & Plan Note (Signed)
?   sz 03/04/21 > Dr Terrace Arabia   No evidence of aspiration / wu in progress by Dr Terrace Arabia who will determine when he can work/ drive.   Pulmonary f/u is prn          Each maintenance medication was reviewed in detail including emphasizing most importantly the difference between maintenance and prns and under what circumstances the prns are to be triggered using an action plan format where appropriate.  Total time for H and P, chart review, counseling,  and generating customized AVS unique to this acute office visit / same day charting = 30 min

## 2021-03-10 ENCOUNTER — Telehealth: Payer: Self-pay | Admitting: Neurology

## 2021-03-10 NOTE — Telephone Encounter (Signed)
A mychart message was sent to the patient w/ results.   Mr. Darren Ramirez    Laboratory evaluation showed: Moderately decreased vitamin D level, 16.7, he would benefit over-the-counter vitamin D3 supplement, 1000 units, 2 tablets every day, and may repeat vitamin D level in 6 to 12 months   Rest of the laboratory evaluation showed no significant abnormalities.   Levert Feinstein, M.D. Ph.D. ___________________________________  I called his mother on DPR back. Reviewed the lab findings. She verbalized understanding and will make sure he starts the vitamin D3 supplement.

## 2021-03-10 NOTE — Telephone Encounter (Signed)
Pt's mother is asking that she or pt be called re: results to blood work.

## 2021-03-22 ENCOUNTER — Ambulatory Visit: Payer: BC Managed Care – PPO | Admitting: Internal Medicine

## 2021-03-24 ENCOUNTER — Ambulatory Visit (INDEPENDENT_AMBULATORY_CARE_PROVIDER_SITE_OTHER): Payer: BC Managed Care – PPO | Admitting: Neurology

## 2021-03-24 ENCOUNTER — Encounter: Payer: Self-pay | Admitting: Neurology

## 2021-03-24 DIAGNOSIS — G40909 Epilepsy, unspecified, not intractable, without status epilepticus: Secondary | ICD-10-CM

## 2021-03-30 ENCOUNTER — Telehealth: Payer: Self-pay | Admitting: Neurology

## 2021-03-30 NOTE — Telephone Encounter (Signed)
Pt called asking about his MRI and EEG results. Pt requesting a call back.

## 2021-04-05 NOTE — Procedures (Signed)
   HISTORY: 35 year old male, with seizure on March 04, 2021  TECHNIQUE:  This is a routine 16 channel EEG recording with one channel devoted to a limited EKG recording.  It was performed during wakefulness, drowsiness and asleep.  Hyperventilation and photic stimulation were performed as activating procedures.  There are minimum muscle and movement artifact noted.  Upon maximum arousal, posterior dominant waking rhythm consistent of rhythmic alpha range activity, with frequency of 11 hz. Activities are symmetric over the bilateral posterior derivations and attenuated with eye opening.  Hyperventilation produced mild/moderate buildup with higher amplitude and the slower activities noted.  Photic stimulation did not alter the tracing.  During EEG recording, patient developed drowsiness and no deeper stage of sleep was achieved  During EEG recording, there was no epileptiform discharge noted.  EKG demonstrate sinus rhythm, with heart rate of 64 bpm  CONCLUSION: This is a  normal awake EEG.  There is no electrodiagnostic evidence of epileptiform discharge.  Levert Feinstein, M.D. Ph.D.  [facility] [address] [place], [place] [address] Phone: [phone] Fax:      [phone]

## 2021-04-05 NOTE — Telephone Encounter (Signed)
I have called patient, MRI of the brain from [facility] health showed no significant abnormality, EEG was normal, he has no recurrent seizure, continue has mild pain at the trauma site, occipital region where he landed on the floor,

## 2021-04-25 ENCOUNTER — Telehealth: Payer: Self-pay | Admitting: Neurology

## 2021-04-25 NOTE — Telephone Encounter (Signed)
IMPRESSION:  Minimal paranasal sinus disease. Otherwise, unremarkable examination  Please call patient, MRI of brain with and without contrast from [facility] March 22, 2021 showed no significant abnormalities.

## 2021-04-25 NOTE — Telephone Encounter (Signed)
Left patient a detailed message, with results, on voicemail (ok per DPR).  Provided our number to call back with any questions.  

## 2021-08-30 ENCOUNTER — Telehealth: Payer: Self-pay

## 2021-08-30 DIAGNOSIS — Z0289 Encounter for other administrative examinations: Secondary | ICD-10-CM

## 2021-08-30 NOTE — Telephone Encounter (Signed)
Dr. Terrace Arabia completed and signed patient's STD paperwork. Given to MR for processing. ?

## 2021-08-30 NOTE — Telephone Encounter (Signed)
I received patient's disability form. It is unclear if patient is working currently.  ? ?I called patient. Dr. Krista Blue wrote him out of work from 03/08/2021-03/22/2021. He needs the form to reflect this. ?

## 2021-09-07 NOTE — Progress Notes (Signed)
? ? ?Patient: Darren Ramirez ?Date of Birth: 1986-05-25 ? ?Reason for Visit: Follow up for single seizure  ?History from: Patient ?Primary Neurologist: Dr. Krista Blue  ? ?ASSESSMENT AND PLAN ?36 y.o. year old male  ? ?1.  Seizure (March 04, 2021) ? ?-No recurrent seizure events ?-EEG was normal ?-MRI of the brain with and without contrast was unremarkable ?-Vitamin D level was 16.7, will recheck today  ?-Hold AED since single seizure event ?-Call for any seizure activity, will follow-up in 6 months, if no further events we will see him on an as-needed basis ? ?HISTORY  ?Darren Ramirez is a 37 year old male, seen in request by his primary care nurse practitioner in the [place], Marvis Repress and Dr. Tanda Rockers, for evaluation of seizure, initial evaluation was on March 08, 2021 ?  ?I reviewed and summarized the referring note. PMHx. ?Psoriasis, has been receiving Remicade infusion every 8 weeks, ? 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  ?On March 04, 2021, he only had slight breath first, then took his usual Remicade infusion, he brought big lunch back home, he ate his lunch with a cold drink, then he developed this brain freeze, bilateral frontal headache, closed his eyes, then he lost consciousness, was witnessed by his wife fell hard backwards on the floor, with big bruise at occipital region, right lateral tongue biting, body jerking movement, lasting for few minutes, when paramedic checked on him few minutes later, he was noted to have elevated blood pressure 200/100, confusion, does not know his name, date of birth, ? ?Since the event, he had intermittent brain foggy sensation, wooziness, still has occipital area pain where he had the big bruise ?  ?Update September 08, 2021 SS: No further seizure events. Is back at work. 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 Take 1 Vitamin D gummy. Has 3 young kids. ? ?Laboratory evaluation showed vitamin D 16.7, EEG was normal, MRI of the brain with and without contrast from [facility] In October 2022 showed no significant abnormalities.  ? ?REVIEW OF SYSTEMS: Out of a complete 14 system review of symptoms, the patient complains only of the following symptoms, and all other reviewed systems are negative. ? ?See HPI ? ?ALLERGIES: ?No Known Allergies ? ?HOME MEDICATIONS: ?Outpatient Medications Prior to Visit  ?Medication Sig Dispense Refill  ? cetirizine (ZYRTEC) 10 MG tablet Take 10 mg by mouth daily.    ? clobetasol (TEMOVATE) 0.05 % GEL Apply 1 application topically daily as needed (For dry skin).    ? dextromethorphan-guaiFENesin (MUCINEX DM) 30-600 MG 12hr tablet Take 1 tablet by mouth 2 (two) times daily as needed for cough.    ? inFLIXimab (REMICADE) 100 MG injection Inject 100 mg into the vein See admin instructions. Inject 100 mg intravenous every 7 to 8 weeks    ? potassium chloride 20 MEQ TBCR Take 40 mEq by mouth daily for 5 days. 10 tablet 0  ? calcium gluconate 500 MG tablet Take 1 tablet (500 mg total) by mouth 3 (three) times daily for 7 days. 21 tablet 0  ? ?No facility-administered medications prior to visit.  ? ? ?PAST MEDICAL HISTORY: ?Past Medical History:  ?Diagnosis Date  ? Anemia   ? Arthralgia   ? Seasonal allergies   ? Tinea pedis   ? ? ?PAST SURGICAL  HISTORY: ?No past surgical history on file. ? ?FAMILY HISTORY: ?Family History  ?Problem Relation Age of Onset  ? Hyperlipidemia Father   ? Sleep apnea Father   ? Thyroid disease Father   ? ? ?SOCIAL HISTORY: ?Social History  ? ?Socioeconomic History  ? Marital status: Single  ?  Spouse name: Not on file  ? Number of children: Not on file  ? Years of education: Not on file  ? Highest education level: Not on file  ?Occupational History  ? Occupation: Architect  ?Tobacco Use  ? Smoking status: Former  ?  Types: Cigars   ?  Quit date: 06/05/2010  ?  Years since quitting: 11.2  ? Smokeless tobacco: Never  ?Substance and Sexual Activity  ? Alcohol use: Yes  ?  Comment: 1-2 times per month  ? Drug use: No  ? Sexual activity: Not on file  ?Other Topics Concern  ? Not on file  ?Social History Narrative  ? Not on file  ? ?Social Determinants of Health  ? ?Financial Resource Strain: Not on file  ?Food Insecurity: Not on file  ?Transportation Needs: Not on file  ?Physical Activity: Not on file  ?Stress: Not on file  ?Social Connections: Not on file  ?Intimate Partner Violence: Not on file  ? ? ?PHYSICAL EXAM ? ?Vitals:  ? 09/08/21 0918  ?BP: 123/73  ?Pulse: 68  ?Weight: 217 lb (98.4 kg)  ?Height: 5\' 6"  (1.676 m)  ? ?Body mass index is 35.02 kg/m?. ? ?Generalized: Well developed, in no acute distress  ?Neurological examination  ?Mentation: Alert oriented to time, place, history taking. Follows all commands speech and language fluent ?Cranial nerve II-XII: Pupils were equal round reactive to light. Extraocular movements were full, visual field were full on confrontational test. Facial sensation and strength were normal. Head turning and shoulder shrug  were normal and symmetric. ?Motor: The motor testing reveals 5 over 5 strength of all 4 extremities. Good symmetric motor tone is noted throughout.  ?Sensory: Sensory testing is intact to soft touch on all 4 extremities. No evidence of extinction is noted.  ?Coordination: Cerebellar testing reveals good finger-nose-finger and heel-to-shin bilaterally.  ?Gait and station: Gait is normal.  ?Reflexes: Deep tendon reflexes are symmetric and normal bilaterally.  ? ?DIAGNOSTIC DATA (LABS, IMAGING, TESTING) ?- I reviewed patient records, labs, notes, testing and imaging myself where available. ? ?Lab Results  ?Component Value Date  ? WBC SPECIMEN CLOTTED 03/04/2021  ? HGB SPECIMEN CLOTTED 03/04/2021  ? HCT SPECIMEN CLOTTED 03/04/2021  ? MCV SPECIMEN CLOTTED 03/04/2021  ? PLT SPECIMEN CLOTTED 03/04/2021   ? ?   ?Component Value Date/Time  ? NA 140 03/08/2021 0903  ? K 4.8 03/08/2021 0903  ? CL 102 03/08/2021 0903  ? CO2 23 03/08/2021 0903  ? GLUCOSE 93 03/08/2021 0903  ? GLUCOSE 122 (H) 03/04/2021 1320  ? BUN 14 03/08/2021 0903  ? CREATININE 1.03 03/08/2021 0903  ? CALCIUM 9.7 03/08/2021 0903  ? PROT 7.8 03/08/2021 0903  ? ALBUMIN 4.8 03/08/2021 0903  ? AST 17 03/08/2021 0903  ? ALT 30 03/08/2021 0903  ? ALKPHOS 66 03/08/2021 0903  ? BILITOT 0.3 03/08/2021 0903  ? GFRNONAA >60 03/04/2021 1320  ? ?Lab Results  ?Component Value Date  ? CHOL 205 (H) 11/02/2017  ? HDL 41.80 11/02/2017  ? LDLCALC 146 (H) 11/02/2017  ? TRIG 84.0 11/02/2017  ? CHOLHDL 5 11/02/2017  ? ?No results found for: HGBA1C ?Lab Results  ?Component Value Date  ?  [id number] 10/22/2009  ? ?Lab Results  ?Component Value Date  ? TSH 0.940 03/08/2021  ? ? ?Butler Denmark, AGNP-C, DNP 09/08/2021, 9:47 AM ?[facility] ?[address], [address] ?[place], [place] [address] ?[phone] ? ? ?

## 2021-09-08 ENCOUNTER — Ambulatory Visit: Payer: BC Managed Care – PPO | Admitting: Neurology

## 2021-09-08 ENCOUNTER — Encounter: Payer: Self-pay | Admitting: Neurology

## 2021-09-08 VITALS — BP 123/73 | HR 68 | Ht 66.0 in | Wt 217.0 lb

## 2021-09-08 DIAGNOSIS — G40909 Epilepsy, unspecified, not intractable, without status epilepticus: Secondary | ICD-10-CM

## 2021-09-08 NOTE — Patient Instructions (Signed)
Recheck Vitamin D level  ?Call for any seizures  ?See you back in 6 months  ?

## 2021-09-09 LAB — VITAMIN D 25 HYDROXY (VIT D DEFICIENCY, FRACTURES): Vit D, 25-Hydroxy: 17 ng/mL — ABNORMAL LOW (ref 30.0–100.0)

## 2021-11-06 ENCOUNTER — Ambulatory Visit
Admission: EM | Admit: 2021-11-06 | Discharge: 2021-11-06 | Disposition: A | Discharge status: Home / Self Care | Payer: PRIVATE HEALTH INSURANCE | Attending: Internal Medicine | Admitting: Internal Medicine

## 2021-11-06 DIAGNOSIS — J069 Acute upper respiratory infection, unspecified: Secondary | ICD-10-CM

## 2021-11-06 NOTE — ED Triage Notes (Signed)
Pt presents covid positive; pt states both his parents have been covid positive the past few days & he has had sore throat and congestion X 2 days.

## 2021-11-06 NOTE — ED Provider Notes (Signed)
[name]    CSN: [id number] Arrival date & time: 11/06/21  0814      History   Chief Complaint Chief Complaint  Patient presents with   Covid +    HPI Darren Ramirez is a 36 y.o. male.   Patient presents with nasal congestion and sore throat that has been present for approximately 2 days.  Patient is concerned for COVID-19 given that his father recently tested positive.  Denies any documented fevers at home but patient states that he has felt feverish.  Denies chest pain, shortness of breath, cough, nausea, vomiting, diarrhea, abdominal pain.  Patient has taken Mucinex for symptoms with minimal improvement.    Past Medical History:  Diagnosis Date   Anemia    Arthralgia    Seasonal allergies    Tinea pedis     Patient Active Problem List   Diagnosis Date Noted   Seizure disorder (HCC) 03/08/2021   Low serum calcium 03/08/2021   Pustule 01/23/2020   Cervical radiculopathy 11/23/2017   Radicular pain in left arm 04/13/2016   Paresthesia of right leg 10/10/2015   Chest pain of unknown etiology 10/07/2015   Upper respiratory tract infection 06/25/2013   TINEA PEDIS 10/05/2009   HYPERCHOLESTEROLEMIA 11/20/2008   Psoriatic arthritis (HCC) 11/20/2008   MICROCYTOSIS 11/20/2008    History reviewed. No pertinent surgical history.     Home Medications    Prior to Admission medications   Medication Sig Start Date End Date Taking? Authorizing Provider  cetirizine (ZYRTEC) 10 MG tablet Take 10 mg by mouth daily.    [provider]  clobetasol (TEMOVATE) 0.05 % GEL Apply 1 application topically daily as needed (For dry skin). 10/15/20   [provider]  dextromethorphan-guaiFENesin (MUCINEX DM) 30-600 MG 12hr tablet Take 1 tablet by mouth 2 (two) times daily as needed for cough.    [provider]  inFLIXimab (REMICADE) 100 MG injection Inject 100 mg into the vein See admin instructions. Inject 100 mg intravenous every 7 to 8 weeks     [provider]  potassium chloride 20 MEQ TBCR Take 40 mEq by mouth daily for 5 days. 03/04/21 03/09/21  Horton, Clabe SealKristie M, DO    Family History Family History  Problem Relation Age of Onset   Hyperlipidemia Father    Sleep apnea Father    Thyroid disease Father     Social History Social History   Tobacco Use   Smoking status: Former    Types: Cigars    Quit date: 06/05/2010    Years since quitting: 11.4   Smokeless tobacco: Never  Substance Use Topics   Alcohol use: Yes    Comment: 1-2 times per month   Drug use: No     Allergies   Patient has no known allergies.   Review of Systems Review of Systems Per HPI  Physical Exam Triage Vital Signs ED Triage Vitals  Enc Vitals Group     BP 11/06/21 0839 131/89     Pulse Rate 11/06/21 0839 70     Resp 11/06/21 0839 18     Temp 11/06/21 0839 98.1 F (36.7 C)     Temp Source 11/06/21 0839 Oral     SpO2 11/06/21 0839 96 %     Weight --      Height --      Head Circumference --      Peak Flow --      Pain Score 11/06/21 0838 0  Pain Loc --      Pain Edu? --      Excl. in GC? --    No data found.  Updated Vital Signs BP 131/89 (BP Location: Right Arm)   Pulse 70   Temp 98.1 F (36.7 C) (Oral)   Resp 18   SpO2 96%   Visual Acuity Right Eye Distance:   Left Eye Distance:   Bilateral Distance:    Right Eye Near:   Left Eye Near:    Bilateral Near:     Physical Exam Constitutional:      General: He is not in acute distress.    Appearance: Normal appearance. He is not toxic-appearing or diaphoretic.  HENT:     Head: Normocephalic and atraumatic.     Right Ear: Tympanic membrane and ear canal normal.     Left Ear: Tympanic membrane and ear canal normal.     Nose: Congestion present.     Mouth/Throat:     Mouth: Mucous membranes are moist.     Pharynx: No posterior oropharyngeal erythema.  Eyes:     Extraocular Movements: Extraocular movements intact.     Conjunctiva/sclera:  Conjunctivae normal.     Pupils: Pupils are equal, round, and reactive to light.  Cardiovascular:     Rate and Rhythm: Normal rate and regular rhythm.     Pulses: Normal pulses.     Heart sounds: Normal heart sounds.  Pulmonary:     Effort: Pulmonary effort is normal. No respiratory distress.     Breath sounds: Normal breath sounds. No stridor. No wheezing, rhonchi or rales.  Abdominal:     General: Abdomen is flat. Bowel sounds are normal.     Palpations: Abdomen is soft.  Musculoskeletal:        General: Normal range of motion.     Cervical back: Normal range of motion.  Skin:    General: Skin is warm and dry.  Neurological:     General: No focal deficit present.     Mental Status: He is alert and oriented to person, place, and time. Mental status is at baseline.  Psychiatric:        Mood and Affect: Mood normal.        Behavior: Behavior normal.     UC Treatments / Results  Labs (all labs ordered are listed, but only abnormal results are displayed) Labs Reviewed  NOVEL CORONAVIRUS, NAA    EKG   Radiology No results found.  Procedures Procedures (including critical care time)  Medications Ordered in UC Medications - No data to display  Initial Impression / Assessment and Plan / UC Course  I have reviewed the triage vital signs and the nursing notes.  Pertinent labs & imaging results that were available during my care of the patient were reviewed by me and considered in my medical decision making (see chart for details).     Patient presents with symptoms likely from a viral upper respiratory infection. Differential includes bacterial pneumonia, sinusitis, allergic rhinitis, COVID-19, flu. Do not suspect underlying cardiopulmonary process. Symptoms seem unlikely related to ACS, CHF or COPD exacerbations, pneumonia, pneumothorax. Patient is nontoxic appearing and not in need of emergent medical intervention.  Do not think that strep testing is necessary given low  suspicion with the physical exam.  Highly suspicious of COVID-19 given patient's close exposure.  COVID test pending.  Recommended symptom control with over the counter medications.  Patient will qualify for molnupiravir antiviral medication if COVID test is  positive.  Return if symptoms fail to improve in 1-2 weeks or you develop shortness of breath, chest pain, severe headache. Patient states understanding and is agreeable.  Discharged with PCP followup.  Final Clinical Impressions(s) / UC Diagnoses   Final diagnoses:  Viral upper respiratory infection     Discharge Instructions      It is highly suspicious that you could have COVID given that your father tested positive.  Your COVID test is pending.  We will call if it is positive.  We recommend that you quarantine at least 5 days from symptom start and wear a tight fitting mask for an additional 5 days.  If COVID test is positive, we will call you and we will discuss antiviral medications.  COVID is a virus that is treated symptomatically with over-the-counter medications as well.    ED Prescriptions   None    PDMP not reviewed this encounter.   Gustavus Bryant, [place] 11/06/21 [phone]

## 2021-11-06 NOTE — Discharge Instructions (Signed)
It is highly suspicious that you could have COVID given that your father tested positive.  Your COVID test is pending.  We will call if it is positive.  We recommend that you quarantine at least 5 days from symptom start and wear a tight fitting mask for an additional 5 days.  If COVID test is positive, we will call you and we will discuss antiviral medications.  COVID is a virus that is treated symptomatically with over-the-counter medications as well.

## 2021-11-08 LAB — NOVEL CORONAVIRUS, NAA: SARS-CoV-2, NAA: DETECTED — AB

## 2021-11-29 ENCOUNTER — Ambulatory Visit
Admission: EM | Admit: 2021-11-29 | Discharge: 2021-11-29 | Disposition: A | Discharge status: Home / Self Care | Payer: BC Managed Care – PPO | Attending: Internal Medicine | Admitting: Internal Medicine

## 2021-11-29 DIAGNOSIS — J039 Acute tonsillitis, unspecified: Secondary | ICD-10-CM | POA: Insufficient documentation

## 2021-11-29 DIAGNOSIS — J029 Acute pharyngitis, unspecified: Secondary | ICD-10-CM | POA: Insufficient documentation

## 2021-11-29 LAB — POCT RAPID STREP A (OFFICE): Rapid Strep A Screen: NEGATIVE

## 2021-11-29 MED ORDER — AMOXICILLIN 500 MG PO CAPS: 500.0000 mg | ORAL_CAPSULE | Freq: Two times a day (BID) | ORAL | 0 refills | Status: AC

## 2021-11-29 NOTE — ED Provider Notes (Signed)
[name]    CSN: [id number] Arrival date & time: 11/29/21  0804      History   Chief Complaint Chief Complaint  Patient presents with   Sore Throat    HPI Darren Ramirez is a 36 y.o. male.   Patient presents with sore throat, generalized body aches, chills that started yesterday.  Denies any associated upper respiratory symptoms, cough, fever, ear pain, nausea, vomiting, diarrhea, abdominal pain.  Denies any known sick contacts.  Patient has taken Robitussin with minimal improvement.   Sore Throat    Past Medical History:  Diagnosis Date   Anemia    Arthralgia    Seasonal allergies    Tinea pedis     Patient Active Problem List   Diagnosis Date Noted   Seizure disorder (HCC) 03/08/2021   Low serum calcium 03/08/2021   Pustule 01/23/2020   Cervical radiculopathy 11/23/2017   Radicular pain in left arm 04/13/2016   Paresthesia of right leg 10/10/2015   Chest pain of unknown etiology 10/07/2015   Upper respiratory tract infection 06/25/2013   TINEA PEDIS 10/05/2009   HYPERCHOLESTEROLEMIA 11/20/2008   Psoriatic arthritis (HCC) 11/20/2008   MICROCYTOSIS 11/20/2008    History reviewed. No pertinent surgical history.     Home Medications    Prior to Admission medications   Medication Sig Start Date End Date Taking? Authorizing Provider  amoxicillin (AMOXIL) 500 MG capsule Take 1 capsule (500 mg total) by mouth 2 (two) times daily for 10 days. 11/29/21 12/09/21 Yes Nikkia Devoss, Acie Fredrickson, FNP  dextromethorphan-guaiFENesin (MUCINEX DM) 30-600 MG 12hr tablet Take 1 tablet by mouth 2 (two) times daily as needed for cough.    [provider]    Family History Family History  Problem Relation Age of Onset   Hyperlipidemia Father    Sleep apnea Father    Thyroid disease Father     Social History Social History   Tobacco Use   Smoking status: Former    Types: Cigars    Quit date: 06/05/2010    Years since quitting: 11.4   Smokeless tobacco:  Never  Substance Use Topics   Alcohol use: Yes    Comment: 1-2 times per month   Drug use: No     Allergies   Patient has no known allergies.   Review of Systems Review of Systems Per HPI  Physical Exam Triage Vital Signs ED Triage Vitals  Enc Vitals Group     BP 11/29/21 0822 (!) 130/92     Pulse Rate 11/29/21 0822 81     Resp 11/29/21 0822 18     Temp 11/29/21 0822 98.1 F (36.7 C)     Temp Source 11/29/21 0822 Oral     SpO2 11/29/21 0822 97 %     Weight --      Height --      Head Circumference --      Peak Flow --      Pain Score 11/29/21 0823 10     Pain Loc --      Pain Edu? --      Excl. in GC? --    No data found.  Updated Vital Signs BP (!) 130/92 (BP Location: Left Arm)   Pulse 81   Temp 98.1 F (36.7 C) (Oral)   Resp 18   SpO2 97%   Visual Acuity Right Eye Distance:   Left Eye Distance:   Bilateral Distance:    Right Eye Near:   Left Eye Near:  Bilateral Near:     Physical Exam Constitutional:      General: He is not in acute distress.    Appearance: Normal appearance. He is not toxic-appearing or diaphoretic.  HENT:     Head: Normocephalic and atraumatic.     Right Ear: Tympanic membrane and ear canal normal.     Left Ear: Tympanic membrane and ear canal normal.     Nose: Nose normal.     Mouth/Throat:     Mouth: Mucous membranes are moist.     Pharynx: Posterior oropharyngeal erythema present. No oropharyngeal exudate.     Tonsils: Tonsillar exudate present. No tonsillar abscesses. 2+ on the right. 2+ on the left.  Eyes:     Extraocular Movements: Extraocular movements intact.     Conjunctiva/sclera: Conjunctivae normal.     Pupils: Pupils are equal, round, and reactive to light.  Cardiovascular:     Rate and Rhythm: Normal rate and regular rhythm.     Pulses: Normal pulses.     Heart sounds: Normal heart sounds.  Pulmonary:     Effort: Pulmonary effort is normal. No respiratory distress.     Breath sounds: Normal breath  sounds. No wheezing.  Abdominal:     General: Abdomen is flat. Bowel sounds are normal.     Palpations: Abdomen is soft.  Musculoskeletal:        General: Normal range of motion.     Cervical back: Normal range of motion.  Skin:    General: Skin is warm and dry.  Neurological:     General: No focal deficit present.     Mental Status: He is alert and oriented to person, place, and time. Mental status is at baseline.  Psychiatric:        Mood and Affect: Mood normal.        Behavior: Behavior normal.      UC Treatments / Results  Labs (all labs ordered are listed, but only abnormal results are displayed) Labs Reviewed  CULTURE, GROUP A STREP V [facility])  POCT RAPID STREP A (OFFICE)    EKG   Radiology No results found.  Procedures Procedures (including critical care time)  Medications Ordered in UC Medications - No data to display  Initial Impression / Assessment and Plan / UC Course  I have reviewed the triage vital signs and the nursing notes.  Pertinent labs & imaging results that were available during my care of the patient were reviewed by me and considered in my medical decision making (see chart for details).     Rapid strep was negative but I am still highly suspicious of strep throat given appearance of posterior pharynx and acute tonsillitis on exam.  Will opt to treat with amoxicillin antibiotic.  Throat culture is pending.  Discussed supportive care and symptom management with patient.  Discussed return precautions.  No signs of peritonsillar abscess on exam.  Patient verbalized understanding and was agreeable with plan. Final Clinical Impressions(s) / UC Diagnoses   Final diagnoses:  Acute tonsillitis, unspecified etiology  Sore throat     Discharge Instructions      Your rapid strep test was negative but I am still highly suspicious of strep throat given appearance of your throat on exam.  You are being treated with antibiotic medications.  Also  recommend throat lozenges and plenty of fluids.  Please follow-up if symptoms persist or worsen.    ED Prescriptions     Medication Sig Dispense Auth. Provider   amoxicillin (AMOXIL)  500 MG capsule Take 1 capsule (500 mg total) by mouth 2 (two) times daily for 10 days. 20 capsule Gustavus Bryant, [place]      PDMP not reviewed this encounter.   Gustavus Bryant, [place] 11/29/21 [phone]

## 2021-11-29 NOTE — ED Triage Notes (Signed)
Pt c/o sore throat with body aches and chills since yesterday. Took robitussin yesterday with no relief.

## 2021-11-30 LAB — CULTURE, GROUP A STREP (THRC)

## 2022-02-21 ENCOUNTER — Ambulatory Visit: Payer: PRIVATE HEALTH INSURANCE | Admitting: Emergency Medicine

## 2022-03-15 NOTE — Progress Notes (Signed)
Patient: Darren Ramirez Date of Birth: [birth year]  Reason for Visit: Follow up for single seizure  History from: Patient Primary Neurologist: Dr. Krista Blue   ASSESSMENT AND PLAN 36 y.o. year old male   1.  Seizure (March 04, 2021)  -No recurrent seizure events -EEG was normal -MRI of the brain with and without contrast was unremarkable -Vitamin D level was 17.0 April 2023, reports on supplement -Hold AED since single seizure event -Call for any seizure activity, will follow-up in 1 year per patient preference, call for any issues or concerns   HISTORY  Darren Ramirez is a 36 year old male, seen in request by his primary care nurse practitioner in the [place], Marvis Repress and Dr. Tanda Rockers, for evaluation of seizure, initial evaluation was on March 08, 2021   I reviewed and summarized the referring note. PMHx. 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[employer], advertising, [employer] at nighttime, including heavy lifting, usually gets 6 to 7 hours sleep, get up early to drop his children to school   On March 04, 2021, he only had slight breath first, then took his usual Remicade infusion, he brought big lunch back home, he ate his lunch with a cold drink, then he developed this brain freeze, bilateral frontal headache, closed his eyes, then he lost consciousness, was witnessed by his wife fell hard backwards on the floor, with big bruise at occipital region, right lateral tongue biting, body jerking movement, lasting for few minutes, when paramedic checked on him few minutes later, he was noted to have elevated blood pressure 200/100, confusion, does not know his name, date of birth,  Since the event, he had intermittent brain foggy sensation, wooziness, still has occipital area pain where he had the big bruise   Update September 08, 2021 SS: No further seizure events. Is back at work. 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 Take 1 Vitamin D gummy. Has 3 young kids.  Laboratory evaluation showed vitamin D 16.7, EEG was normal, MRI of the brain with and without contrast from [facility] October 2022 showed no significant abnormalities.   Update March 16, 2022 SS: Doing well, no recurrent seizures or preseizure feeling.  Continues to work 2 jobs. Takes Vitamin D supplement, has appointment with new PCP in November.  No new issues or concerns.  REVIEW OF SYSTEMS: Out of a complete 14 system review of symptoms, the patient complains only of the following symptoms, and all other reviewed systems are negative.  See HPI  ALLERGIES: No Known Allergies  HOME MEDICATIONS: Outpatient Medications Prior to Visit  Medication Sig Dispense Refill   Cetirizine HCl (ZYRTEC ALLERGY PO) Take 1 tablet by mouth as needed.     dextromethorphan-guaiFENesin (MUCINEX DM) 30-600 MG 12hr tablet Take 1 tablet by mouth 2 (two) times daily as needed for cough.     No facility-administered medications prior to visit.    PAST MEDICAL HISTORY: Past Medical History:  Diagnosis Date   Anemia    Arthralgia    Seasonal allergies    Tinea pedis     PAST SURGICAL HISTORY: History reviewed. No pertinent surgical history.  FAMILY HISTORY: Family History  Problem Relation Age of Onset   Hyperlipidemia Father    Sleep apnea Father    Thyroid disease Father     SOCIAL HISTORY: Social History   Socioeconomic History   Marital status: Single  Spouse name: Not on file   Number of children: Not on file   Years of education: Not on file   Highest education level: Not on file  Occupational History   Occupation: Holiday representative  Tobacco Use   Smoking status: Former    Types: Cigars    Quit date: 06/05/2010    Years since quitting: 11.7   Smokeless tobacco: Never  Substance and Sexual Activity   Alcohol use: Yes    Comment: 1-2 times per month   Drug use: No   Sexual  activity: Not on file  Other Topics Concern   Not on file  Social History Narrative   Not on file   Social Determinants of Health   Financial Resource Strain: Not on file  Food Insecurity: Not on file  Transportation Needs: Not on file  Physical Activity: Not on file  Stress: Not on file  Social Connections: Not on file  Intimate Partner Violence: Not on file   PHYSICAL EXAM  Vitals:   03/16/22 0928  BP: 130/87  Pulse: (!) 59  Weight: 208 lb (94.3 kg)  Height: 5\' 6"  (1.676 m)   Body mass index is 33.57 kg/m.  Generalized: Well developed, in no acute distress  Neurological examination  Mentation: Alert oriented to time, place, history taking. Follows all commands speech and language fluent Cranial nerve II-XII: Pupils were equal round reactive to light. Extraocular movements were full, visual field were full on confrontational test. Facial sensation and strength were normal. Head turning and shoulder shrug  were normal and symmetric. Motor: The motor testing reveals 5 over 5 strength of all 4 extremities. Good symmetric motor tone is noted throughout.  Sensory: Sensory testing is intact to soft touch on all 4 extremities. No evidence of extinction is noted.  Coordination: Cerebellar testing reveals good finger-nose-finger and heel-to-shin bilaterally.  Gait and station: Gait is normal.  Reflexes: Deep tendon reflexes are symmetric and normal bilaterally.   DIAGNOSTIC DATA (LABS, IMAGING, TESTING) - I reviewed patient records, labs, notes, testing and imaging myself where available.  Lab Results  Component Value Date   WBC SPECIMEN CLOTTED 03/04/2021   HGB SPECIMEN CLOTTED 03/04/2021   HCT SPECIMEN CLOTTED 03/04/2021   MCV SPECIMEN CLOTTED 03/04/2021   PLT SPECIMEN CLOTTED 03/04/2021      Component Value Date/Time   NA 140 03/08/2021 0903   K 4.8 03/08/2021 0903   CL 102 03/08/2021 0903   CO2 23 03/08/2021 0903   GLUCOSE 93 03/08/2021 0903   GLUCOSE 122 (H)  03/04/2021 1320   BUN 14 03/08/2021 0903   CREATININE 1.03 03/08/2021 0903   CALCIUM 9.7 03/08/2021 0903   PROT 7.8 03/08/2021 0903   ALBUMIN 4.8 03/08/2021 0903   AST 17 03/08/2021 0903   ALT 30 03/08/2021 0903   ALKPHOS 66 03/08/2021 0903   BILITOT 0.3 03/08/2021 0903   GFRNONAA >60 03/04/2021 1320   Lab Results  Component Value Date   CHOL 205 (H) 11/02/2017   HDL 41.80 11/02/2017   LDLCALC 146 (H) 11/02/2017   TRIG 84.0 11/02/2017   CHOLHDL 5 11/02/2017   No results found for: "HGBA1C" Lab Results  Component Value Date   VITAMINB12 390 10/22/2009   Lab Results  Component Value Date   TSH 0.940 03/08/2021   05/08/2021, AGNP-C, DNP 03/16/2022, 9:50 AM [facility] [address] [place], [place] [phone]

## 2022-03-16 ENCOUNTER — Encounter: Payer: Self-pay | Admitting: Neurology

## 2022-03-16 ENCOUNTER — Ambulatory Visit (INDEPENDENT_AMBULATORY_CARE_PROVIDER_SITE_OTHER): Payer: BC Managed Care – PPO | Admitting: Neurology

## 2022-03-16 VITALS — BP 130/87 | HR 59 | Ht 66.0 in | Wt 208.0 lb

## 2022-03-16 DIAGNOSIS — G40909 Epilepsy, unspecified, not intractable, without status epilepticus: Secondary | ICD-10-CM | POA: Diagnosis not present

## 2022-03-16 NOTE — Patient Instructions (Signed)
Great  to see you today!  I am glad you are doing so well Please call for any other concerns or seizure activity I will see back in 1 year

## 2022-05-10 ENCOUNTER — Ambulatory Visit (INDEPENDENT_AMBULATORY_CARE_PROVIDER_SITE_OTHER): Payer: BC Managed Care – PPO | Admitting: Emergency Medicine

## 2022-05-10 ENCOUNTER — Encounter: Payer: Self-pay | Admitting: Internal Medicine

## 2022-05-10 ENCOUNTER — Encounter: Payer: Self-pay | Admitting: Emergency Medicine

## 2022-05-10 VITALS — BP 110/70 | HR 77 | Temp 97.9°F | Ht 66.0 in | Wt 216.0 lb

## 2022-05-10 DIAGNOSIS — Z130 Encounter for screening for diseases of the blood and blood-forming organs and certain disorders involving the immune mechanism: Secondary | ICD-10-CM | POA: Diagnosis not present

## 2022-05-10 DIAGNOSIS — Z114 Encounter for screening for human immunodeficiency virus [HIV]: Secondary | ICD-10-CM

## 2022-05-10 DIAGNOSIS — Z0000 Encounter for general adult medical examination without abnormal findings: Secondary | ICD-10-CM | POA: Diagnosis not present

## 2022-05-10 DIAGNOSIS — Z13220 Encounter for screening for lipoid disorders: Secondary | ICD-10-CM | POA: Diagnosis not present

## 2022-05-10 DIAGNOSIS — Z7689 Persons encountering health services in other specified circumstances: Secondary | ICD-10-CM

## 2022-05-10 DIAGNOSIS — Z13228 Encounter for screening for other metabolic disorders: Secondary | ICD-10-CM

## 2022-05-10 DIAGNOSIS — L4050 Arthropathic psoriasis, unspecified: Secondary | ICD-10-CM

## 2022-05-10 DIAGNOSIS — Z1329 Encounter for screening for other suspected endocrine disorder: Secondary | ICD-10-CM | POA: Diagnosis not present

## 2022-05-10 DIAGNOSIS — Z1159 Encounter for screening for other viral diseases: Secondary | ICD-10-CM

## 2022-05-10 LAB — COMPREHENSIVE METABOLIC PANEL
ALT: 31 U/L (ref 0–53)
AST: 20 U/L (ref 0–37)
Albumin: 4.6 g/dL (ref 3.5–5.2)
Alkaline Phosphatase: 59 U/L (ref 39–117)
BUN: 14 mg/dL (ref 6–23)
CO2: 30 mEq/L (ref 19–32)
Calcium: 9.5 mg/dL (ref 8.4–10.5)
Chloride: 105 mEq/L (ref 96–112)
Creatinine, Ser: 1.12 mg/dL (ref 0.40–1.50)
GFR: 84.97 mL/min (ref >60.00)
Glucose, Bld: 76 mg/dL (ref 70–99)
Potassium: 4.4 mEq/L (ref 3.5–5.1)
Sodium: 139 mEq/L (ref 135–145)
Total Bilirubin: 0.3 mg/dL (ref 0.2–1.2)
Total Protein: 7.9 g/dL (ref 6.0–8.3)

## 2022-05-10 LAB — TSH: TSH: 0.58 u[IU]/mL (ref 0.35–5.50)

## 2022-05-10 LAB — CBC WITH DIFFERENTIAL/PLATELET
Basophils Absolute: 0.1 10*3/uL (ref 0.0–0.1)
Basophils Relative: 0.7 % (ref 0.0–3.0)
Eosinophils Absolute: 0.2 10*3/uL (ref 0.0–0.7)
Eosinophils Relative: 2.2 % (ref 0.0–5.0)
HCT: 44.6 % (ref 39.0–52.0)
Hemoglobin: 15.1 g/dL (ref 13.0–17.0)
Lymphocytes Relative: 32.6 % (ref 12.0–46.0)
Lymphs Abs: 2.9 10*3/uL (ref 0.7–4.0)
MCHC: 33.8 g/dL (ref 30.0–36.0)
MCV: 73 fl — ABNORMAL LOW (ref 78.0–100.0)
Monocytes Absolute: 0.7 10*3/uL (ref 0.1–1.0)
Monocytes Relative: 7.4 % (ref 3.0–12.0)
Neutro Abs: 5.1 10*3/uL (ref 1.4–7.7)
Neutrophils Relative %: 57.1 % (ref 43.0–77.0)
Platelets: 334 10*3/uL (ref 150.0–400.0)
RBC: 6.11 Mil/uL — ABNORMAL HIGH (ref 4.22–5.81)
RDW: 14 % (ref 11.5–15.5)
WBC: 8.9 10*3/uL (ref 4.0–10.5)

## 2022-05-10 LAB — HEMOGLOBIN A1C: Hgb A1c MFr Bld: 6.1 % (ref 4.6–6.5)

## 2022-05-10 LAB — LIPID PANEL
Cholesterol: 204 mg/dL — ABNORMAL HIGH (ref 0–200)
HDL: 38.5 mg/dL — ABNORMAL LOW (ref >39.00)
LDL Cholesterol: 130 mg/dL — ABNORMAL HIGH (ref 0–99)
NonHDL: 165.39
Total CHOL/HDL Ratio: 5
Triglycerides: 177 mg/dL — ABNORMAL HIGH (ref 0.0–149.0)
VLDL: 35.4 mg/dL (ref 0.0–40.0)

## 2022-05-10 NOTE — Progress Notes (Signed)
Darren Ramirez 36 y.o.   Chief Complaint  Patient presents with   New Patient (Initial Visit)    HISTORY OF PRESENT ILLNESS: This is a 36 y.o. male first visit to this office, here to establish care with me. Past medical history includes psoriasis and possible psoriatic arthritis.  History of seizure disorder on no medications with negative workup No other chronic medical problems Non-smoker. No other complaints or medical concerns today.  HPI   Prior to Admission medications   Medication Sig Start Date End Date Taking? Authorizing Provider  Cetirizine HCl (ZYRTEC ALLERGY PO) Take 1 tablet by mouth as needed.   Yes [provider]  clobetasol (TEMOVATE) 0.05 % GEL 1 application to affected area Externally Twice a day for 30 days   Yes [provider]  dextromethorphan-guaiFENesin (MUCINEX DM) 30-600 MG 12hr tablet Take 1 tablet by mouth 2 (two) times daily as needed for cough.   Yes [provider]  inFLIXimab (REMICADE) 100 MG injection 100 mg.   Yes [provider]    No Known Allergies  Patient Active Problem List   Diagnosis Date Noted   Seizure disorder (HCC) 03/08/2021   Cervical radiculopathy 11/23/2017   HYPERCHOLESTEROLEMIA 11/20/2008   Psoriatic arthritis (HCC) 11/20/2008    Past Medical History:  Diagnosis Date   Anemia    Arthralgia    Seasonal allergies    Tinea pedis     No past surgical history on file.  Social History   Socioeconomic History   Marital status: Single    Spouse name: Not on file   Number of children: Not on file   Years of education: Not on file   Highest education level: Not on file  Occupational History   Occupation: Holiday representative  Tobacco Use   Smoking status: Former    Types: Cigars    Quit date: 06/05/2010    Years since quitting: 11.9   Smokeless tobacco: Never  Substance and Sexual Activity   Alcohol use: Yes    Comment: 1-2 times per month   Drug use: No   Sexual activity: Not  on file  Other Topics Concern   Not on file  Social History Narrative   Not on file   Social Determinants of Health   Financial Resource Strain: Not on file  Food Insecurity: Not on file  Transportation Needs: Not on file  Physical Activity: Not on file  Stress: Not on file  Social Connections: Not on file  Intimate Partner Violence: Not on file    Family History  Problem Relation Age of Onset   Hyperlipidemia Father    Sleep apnea Father    Thyroid disease Father      Review of Systems  Constitutional: Negative.  Negative for chills and fever.  HENT: Negative.  Negative for congestion and sore throat.   Respiratory: Negative.  Negative for cough and shortness of breath.   Cardiovascular: Negative.  Negative for chest pain and palpitations.  Gastrointestinal:  Negative for abdominal pain, diarrhea, nausea and vomiting.  Genitourinary: Negative.   Musculoskeletal: Negative.   Skin:  Positive for rash (Psoriasis).  Neurological: Negative.  Negative for dizziness and headaches.  All other systems reviewed and are negative.  Today's Vitals   05/10/22 0914  BP: 110/70  Pulse: 77  Temp: 97.9 F (36.6 C)  TempSrc: Oral  SpO2: 95%  Weight: 216 lb (98 kg)  Height: 5\' 6"  (1.676 m)   Body mass index is 34.86 kg/m.   Physical  Exam Vitals reviewed.  Constitutional:      Appearance: Normal appearance.  HENT:     Head: Normocephalic.     Right Ear: Tympanic membrane, ear canal and external ear normal.     Left Ear: Tympanic membrane, ear canal and external ear normal.     Mouth/Throat:     Mouth: Mucous membranes are moist.     Pharynx: Oropharynx is clear.  Eyes:     Extraocular Movements: Extraocular movements intact.     Conjunctiva/sclera: Conjunctivae normal.     Pupils: Pupils are equal, round, and reactive to light.  Cardiovascular:     Rate and Rhythm: Normal rate and regular rhythm.     Pulses: Normal pulses.     Heart sounds: Normal heart sounds.   Pulmonary:     Effort: Pulmonary effort is normal.     Breath sounds: Normal breath sounds.  Abdominal:     Palpations: Abdomen is soft.     Tenderness: There is no abdominal tenderness.  Musculoskeletal:        General: Normal range of motion.     Cervical back: No tenderness.  Lymphadenopathy:     Cervical: No cervical adenopathy.  Skin:    General: Skin is warm and dry.     Comments: Psoriasis rash to knees and elbows  Neurological:     General: No focal deficit present.     Mental Status: He is alert and oriented to person, place, and time.  Psychiatric:        Mood and Affect: Mood normal.        Behavior: Behavior normal.      ASSESSMENT & PLAN: Problem List Items Addressed This Visit       Musculoskeletal and Integument   Psoriatic arthritis (HCC)   Other Visit Diagnoses     Routine general medical examination at a health care facility    -  Primary   Relevant Orders   CBC with Differential   Comprehensive metabolic panel   Hemoglobin A1c   Lipid panel   TSH   Encounter to establish care       Need for hepatitis C screening test       Relevant Orders   Hepatitis C antibody screen   Screening for HIV (human immunodeficiency virus)       Relevant Orders   HIV antibody   Screening for deficiency anemia       Relevant Orders   CBC with Differential   Screening for lipoid disorders       Relevant Orders   Lipid panel   Screening for endocrine, metabolic and immunity disorder       Relevant Orders   Comprehensive metabolic panel   Hemoglobin A1c   TSH      Modifiable risk factors discussed with patient. Anticipatory guidance according to age provided. The following topics were also discussed: Social Determinants of Health Smoking.  Non-smoker Diet and nutrition Benefits of exercise Cancer family history review Vaccinations reviewed and recommendations Cardiovascular risk assessment and need for blood work Review of chronic medical problems and  medications Mental health including depression and anxiety Fall and accident prevention  Patient Instructions  Health Maintenance, Male Adopting a healthy lifestyle and getting preventive care are important in promoting health and wellness. Ask your health care provider about: The right schedule for you to have regular tests and exams. Things you can do on your own to prevent diseases and keep yourself healthy. What should I know  about diet, weight, and exercise? Eat a healthy diet  Eat a diet that includes plenty of vegetables, fruits, low-fat dairy products, and lean protein. Do not eat a lot of foods that are high in solid fats, added sugars, or sodium. Maintain a healthy weight Body mass index (BMI) is a measurement that can be used to identify possible weight problems. It estimates body fat based on height and weight. Your health care provider can help determine your BMI and help you achieve or maintain a healthy weight. Get regular exercise Get regular exercise. This is one of the most important things you can do for your health. Most adults should: Exercise for at least 150 minutes each week. The exercise should increase your heart rate and make you sweat (moderate-intensity exercise). Do strengthening exercises at least twice a week. This is in addition to the moderate-intensity exercise. Spend less time sitting. Even light physical activity can be beneficial. Watch cholesterol and blood lipids Have your blood tested for lipids and cholesterol at 35 years of age, then have this test every 5 years. You may need to have your cholesterol levels checked more often if: Your lipid or cholesterol levels are high. You are older than 35 years of age. You are at high risk for heart disease. What should I know about cancer screening? Many types of cancers can be detected early and may often be prevented. Depending on your health history and family history, you may need to have cancer  screening at various ages. This may include screening for: Colorectal cancer. Prostate cancer. Skin cancer. Lung cancer. What should I know about heart disease, diabetes, and high blood pressure? Blood pressure and heart disease High blood pressure causes heart disease and increases the risk of stroke. This is more likely to develop in people who have high blood pressure readings or are overweight. Talk with your health care provider about your target blood pressure readings. Have your blood pressure checked: Every 3-5 years if you are 47-66 years of age. Every year if you are 24 years old or older. If you are between the ages of 31 and 24 and are a current or former smoker, ask your health care provider if you should have a one-time screening for abdominal aortic aneurysm (AAA). Diabetes Have regular diabetes screenings. This checks your fasting blood sugar level. Have the screening done: Once every three years after age 66 if you are at a normal weight and have a low risk for diabetes. More often and at a younger age if you are overweight or have a high risk for diabetes. What should I know about preventing infection? Hepatitis B If you have a higher risk for hepatitis B, you should be screened for this virus. Talk with your health care provider to find out if you are at risk for hepatitis B infection. Hepatitis C Blood testing is recommended for: Everyone born from [birth year] through 1965. Anyone with known risk factors for hepatitis C. Sexually transmitted infections (STIs) You should be screened each year for STIs, including gonorrhea and chlamydia, if: You are sexually active and are younger than 35 years of age. You are older than 35 years of age and your health care provider tells you that you are at risk for this type of infection. Your sexual activity has changed since you were last screened, and you are at increased risk for chlamydia or gonorrhea. Ask your health care provider if  you are at risk. Ask your health care provider about whether  you are at high risk for HIV. Your health care provider may recommend a prescription medicine to help prevent HIV infection. If you choose to take medicine to prevent HIV, you should first get tested for HIV. You should then be tested every 3 months for as long as you are taking the medicine. Follow these instructions at home: Alcohol use Do not drink alcohol if your health care provider tells you not to drink. If you drink alcohol: Limit how much you have to 0-2 drinks a day. Know how much alcohol is in your drink. In the [place] one drink equals one 12 oz bottle of beer (355 mL), one 5 oz glass of wine (148 mL), or one 1 oz glass of hard liquor (44 mL). Lifestyle Do not use any products that contain nicotine or tobacco. These products include cigarettes, chewing tobacco, and vaping devices, such as e-cigarettes. If you need help quitting, ask your health care provider. Do not use street drugs. Do not share needles. Ask your health care provider for help if you need support or information about quitting drugs. General instructions Schedule regular health, dental, and eye exams. Stay current with your vaccines. Tell your health care provider if: You often feel depressed. You have ever been abused or do not feel safe at home. Summary Adopting a healthy lifestyle and getting preventive care are important in promoting health and wellness. Follow your health care provider's instructions about healthy diet, exercising, and getting tested or screened for diseases. Follow your health care provider's instructions on monitoring your cholesterol and blood pressure. This information is not intended to replace advice given to you by your health care provider. Make sure you discuss any questions you have with your health care provider. Document Revised: 10/11/2020 Document Reviewed: 10/11/2020 Elsevier Patient Education  2023 Elsevier Inc.      Edwina BarthMiguel Male Iafrate, MD [facility]

## 2022-05-10 NOTE — Patient Instructions (Signed)
Health Maintenance, Male Adopting a healthy lifestyle and getting preventive care are important in promoting health and wellness. Ask your health care provider about: The right schedule for you to have regular tests and exams. Things you can do on your own to prevent diseases and keep yourself healthy. What should I know about diet, weight, and exercise? Eat a healthy diet  Eat a diet that includes plenty of vegetables, fruits, low-fat dairy products, and lean protein. Do not eat a lot of foods that are high in solid fats, added sugars, or sodium. Maintain a healthy weight Body mass index (BMI) is a measurement that can be used to identify possible weight problems. It estimates body fat based on height and weight. Your health care provider can help determine your BMI and help you achieve or maintain a healthy weight. Get regular exercise Get regular exercise. This is one of the most important things you can do for your health. Most adults should: Exercise for at least 150 minutes each week. The exercise should increase your heart rate and make you sweat (moderate-intensity exercise). Do strengthening exercises at least twice a week. This is in addition to the moderate-intensity exercise. Spend less time sitting. Even light physical activity can be beneficial. Watch cholesterol and blood lipids Have your blood tested for lipids and cholesterol at 35 years of age, then have this test every 5 years. You may need to have your cholesterol levels checked more often if: Your lipid or cholesterol levels are high. You are older than 35 years of age. You are at high risk for heart disease. What should I know about cancer screening? Many types of cancers can be detected early and may often be prevented. Depending on your health history and family history, you may need to have cancer screening at various ages. This may include screening for: Colorectal cancer. Prostate cancer. Skin cancer. Lung  cancer. What should I know about heart disease, diabetes, and high blood pressure? Blood pressure and heart disease High blood pressure causes heart disease and increases the risk of stroke. This is more likely to develop in people who have high blood pressure readings or are overweight. Talk with your health care provider about your target blood pressure readings. Have your blood pressure checked: Every 3-5 years if you are 18-39 years of age. Every year if you are 40 years old or older. If you are between the ages of 65 and 75 and are a current or former smoker, ask your health care provider if you should have a one-time screening for abdominal aortic aneurysm (AAA). Diabetes Have regular diabetes screenings. This checks your fasting blood sugar level. Have the screening done: Once every three years after age 45 if you are at a normal weight and have a low risk for diabetes. More often and at a younger age if you are overweight or have a high risk for diabetes. What should I know about preventing infection? Hepatitis B If you have a higher risk for hepatitis B, you should be screened for this virus. Talk with your health care provider to find out if you are at risk for hepatitis B infection. Hepatitis C Blood testing is recommended for: Everyone born from 1945 through 1965. Anyone with known risk factors for hepatitis C. Sexually transmitted infections (STIs) You should be screened each year for STIs, including gonorrhea and chlamydia, if: You are sexually active and are younger than 35 years of age. You are older than 35 years of age and your   health care provider tells you that you are at risk for this type of infection. Your sexual activity has changed since you were last screened, and you are at increased risk for chlamydia or gonorrhea. Ask your health care provider if you are at risk. Ask your health care provider about whether you are at high risk for HIV. Your health care provider  may recommend a prescription medicine to help prevent HIV infection. If you choose to take medicine to prevent HIV, you should first get tested for HIV. You should then be tested every 3 months for as long as you are taking the medicine. Follow these instructions at home: Alcohol use Do not drink alcohol if your health care provider tells you not to drink. If you drink alcohol: Limit how much you have to 0-2 drinks a day. Know how much alcohol is in your drink. In the [place] one drink equals one 12 oz bottle of beer (355 mL), one 5 oz glass of wine (148 mL), or one 1 oz glass of hard liquor (44 mL). Lifestyle Do not use any products that contain nicotine or tobacco. These products include cigarettes, chewing tobacco, and vaping devices, such as e-cigarettes. If you need help quitting, ask your health care provider. Do not use street drugs. Do not share needles. Ask your health care provider for help if you need support or information about quitting drugs. General instructions Schedule regular health, dental, and eye exams. Stay current with your vaccines. Tell your health care provider if: You often feel depressed. You have ever been abused or do not feel safe at home. Summary Adopting a healthy lifestyle and getting preventive care are important in promoting health and wellness. Follow your health care provider's instructions about healthy diet, exercising, and getting tested or screened for diseases. Follow your health care provider's instructions on monitoring your cholesterol and blood pressure. This information is not intended to replace advice given to you by your health care provider. Make sure you discuss any questions you have with your health care provider. Document Revised: 10/11/2020 Document Reviewed: 10/11/2020 Elsevier Patient Education  2023 Else[employer] Inc.  

## 2022-05-11 LAB — HIV ANTIBODY (ROUTINE TESTING W REFLEX): HIV 1&2 Ab, 4th Generation: NONREACTIVE

## 2022-05-11 LAB — HEPATITIS C ANTIBODY: Hepatitis C Ab: NONREACTIVE

## 2022-07-13 ENCOUNTER — Ambulatory Visit (INDEPENDENT_AMBULATORY_CARE_PROVIDER_SITE_OTHER): Payer: BC Managed Care – PPO

## 2022-07-13 ENCOUNTER — Encounter: Payer: Self-pay | Admitting: Family Medicine

## 2022-07-13 ENCOUNTER — Ambulatory Visit (INDEPENDENT_AMBULATORY_CARE_PROVIDER_SITE_OTHER): Payer: BC Managed Care – PPO | Admitting: Family Medicine

## 2022-07-13 VITALS — BP 118/82 | HR 75 | Temp 97.6°F | Ht 66.0 in | Wt 214.0 lb

## 2022-07-13 DIAGNOSIS — J22 Unspecified acute lower respiratory infection: Secondary | ICD-10-CM

## 2022-07-13 DIAGNOSIS — R0989 Other specified symptoms and signs involving the circulatory and respiratory systems: Secondary | ICD-10-CM | POA: Diagnosis not present

## 2022-07-13 MED ORDER — AZITHROMYCIN 250 MG PO TABS: ORAL_TABLET | ORAL | 0 refills | Status: AC

## 2022-07-13 NOTE — Progress Notes (Signed)
Subjective:  Darren Ramirez is a 37 y.o. male who presents for a 2 wk hx of URI symptoms and cough with chest congestion. Reports nasal congestion and drainage have improved.  ST improved. Cough and chest congestion worsening. Mild shortness of breath. Concerned he may have pneumonia.  Taking Mucinex, Claritin, Robitussin  Denies dizziness, chest pain, palpitations, abdominal pain, N/V/D  ROS as in subjective.   Objective: Vitals:   07/13/22 1059  BP: 118/82  Pulse: 75  Temp: 97.6 F (36.4 C)  SpO2: 99%    General appearance: Alert, WD/WN, no distress, mildly ill appearing                             Skin: warm, no rash                           Head: no sinus tenderness                            Eyes: conjunctiva normal, corneas clear, PERRLA                            Ears: pearly TMs, external ear canals normal                          Nose: septum midline, turbinates swollen, with erythema and clear discharge             Mouth/throat: MMM, tongue normal, mild pharyngeal erythema                           Neck: supple, no adenopathy, no thyromegaly, nontender                          Heart: RRR                         Lungs: CTA bilaterally, no wheezes, rales, or rhonchi      Assessment: Lower respiratory infection - Plan: azithromycin (ZITHROMAX) 250 MG tablet, DG Chest 2 View  Chest congestion - Plan: DG Chest 2 View   Plan: Azithromycin prescribed.  Chest x-ray ordered due to moderate symptoms and concern for pneumonia per patient.  Continue with symptomatic management.   Follow-up if worsening or not improving significantly in the next 4 to 5 days.

## 2022-07-13 NOTE — Patient Instructions (Signed)
Please go downstairs for a chest x-ray before you leave today.  Take the antibiotic as prescribed.  It keeps working for an additional 5 days.  Continue treating your symptoms with Robitussin or Mucinex.  Take Tylenol or ibuprofen for sore throat.  You can also do salt water gargles for your sore throat. Drink plenty of fluids.  Let us know if you are not improving in the next 4 to 5 days or if you are getting worse.

## 2022-07-25 ENCOUNTER — Other Ambulatory Visit: Payer: Self-pay | Admitting: Emergency Medicine

## 2022-07-25 ENCOUNTER — Encounter: Payer: Self-pay | Admitting: Emergency Medicine

## 2022-07-25 ENCOUNTER — Ambulatory Visit: Payer: PRIVATE HEALTH INSURANCE | Admitting: Internal Medicine

## 2022-07-25 ENCOUNTER — Ambulatory Visit (INDEPENDENT_AMBULATORY_CARE_PROVIDER_SITE_OTHER): Payer: BC Managed Care – PPO | Admitting: Emergency Medicine

## 2022-07-25 VITALS — BP 110/74 | HR 106 | Temp 99.7°F | Ht 66.0 in | Wt 210.4 lb

## 2022-07-25 DIAGNOSIS — J039 Acute tonsillitis, unspecified: Secondary | ICD-10-CM | POA: Diagnosis not present

## 2022-07-25 DIAGNOSIS — R6889 Other general symptoms and signs: Secondary | ICD-10-CM

## 2022-07-25 LAB — COMPREHENSIVE METABOLIC PANEL
ALT: 27 U/L (ref 0–53)
AST: 26 U/L (ref 0–37)
Albumin: 4.5 g/dL (ref 3.5–5.2)
Alkaline Phosphatase: 68 U/L (ref 39–117)
BUN: 10 mg/dL (ref 6–23)
CO2: 28 mEq/L (ref 19–32)
Calcium: 9.8 mg/dL (ref 8.4–10.5)
Chloride: 100 mEq/L (ref 96–112)
Creatinine, Ser: 1.13 mg/dL (ref 0.40–1.50)
GFR: 83.95 mL/min (ref >60.00)
Glucose, Bld: 97 mg/dL (ref 70–99)
Potassium: 4.2 mEq/L (ref 3.5–5.1)
Sodium: 136 mEq/L (ref 135–145)
Total Bilirubin: 0.4 mg/dL (ref 0.2–1.2)
Total Protein: 8.6 g/dL — ABNORMAL HIGH (ref 6.0–8.3)

## 2022-07-25 LAB — CBC WITH DIFFERENTIAL/PLATELET
Basophils Absolute: 0.1 10*3/uL (ref 0.0–0.1)
Basophils Relative: 0.4 % (ref 0.0–3.0)
Eosinophils Absolute: 0 10*3/uL (ref 0.0–0.7)
Eosinophils Relative: 0.1 % (ref 0.0–5.0)
HCT: 44.2 % (ref 39.0–52.0)
Hemoglobin: 14.8 g/dL (ref 13.0–17.0)
Lymphocytes Relative: 13.3 % (ref 12.0–46.0)
Lymphs Abs: 2 10*3/uL (ref 0.7–4.0)
MCHC: 33.5 g/dL (ref 30.0–36.0)
MCV: 72.8 fl — ABNORMAL LOW (ref 78.0–100.0)
Monocytes Absolute: 1.4 10*3/uL — ABNORMAL HIGH (ref 0.1–1.0)
Monocytes Relative: 9.5 % (ref 3.0–12.0)
Neutro Abs: 11.5 10*3/uL — ABNORMAL HIGH (ref 1.4–7.7)
Neutrophils Relative %: 76.7 % (ref 43.0–77.0)
Platelets: 335 10*3/uL (ref 150.0–400.0)
RBC: 6.07 Mil/uL — ABNORMAL HIGH (ref 4.22–5.81)
RDW: 13.6 % (ref 11.5–15.5)
WBC: 15 10*3/uL — ABNORMAL HIGH (ref 4.0–10.5)

## 2022-07-25 LAB — POCT RAPID STREP A (OFFICE): Rapid Strep A Screen: NEGATIVE

## 2022-07-25 LAB — POCT INFLUENZA A/B
Influenza A, POC: NEGATIVE
Influenza B, POC: NEGATIVE

## 2022-07-25 LAB — POC COVID19 BINAXNOW: SARS Coronavirus 2 Ag: NEGATIVE

## 2022-07-25 LAB — MONONUCLEOSIS SCREEN: Mono Screen: NEGATIVE

## 2022-07-25 MED ORDER — AMOXICILLIN-POT CLAVULANATE 875-125 MG PO TABS: 1.0000 | ORAL_TABLET | Freq: Two times a day (BID) | ORAL | 0 refills | Status: AC

## 2022-07-25 NOTE — Patient Instructions (Signed)

## 2022-07-25 NOTE — Addendum Note (Signed)
Addended by: Rae Mar on: 07/25/2022 04:27 PM   Modules accepted: Orders

## 2022-07-25 NOTE — Progress Notes (Signed)
Thank you :)

## 2022-07-25 NOTE — Assessment & Plan Note (Addendum)
Advised to rest and stay well-hydrated. Viral illness most likely.  Negative COVID and flu test. May take Tylenol and or Advil as needed

## 2022-07-25 NOTE — Progress Notes (Signed)
Darren Ramirez 37 y.o.   Chief Complaint  Patient presents with   Acute Visit    URI symptoms, patient was seen 2 weeks ago, patient took ABX 2 weeks ago, patient states his throat started hurting on Friday, hurting worse today.   Body aches, hot and cold chills, headache     HISTORY OF PRESENT ILLNESS: Acute problem visit today. This is a 37 y.o. male complaining of sore throat, body aches headache fever and flulike symptoms that started last Friday, days ago. Able to eat and drink.  Denies nausea or vomiting.  Denies difficulty breathing or chest pain.  Denies abdominal pain or diarrhea. Seen in the office on 07/13/2022 by Berlinda Last and started on azithromycin for lower respiratory infection.  Chest x-ray did not show any pneumonia. No other complaints or medical concerns today.  HPI   Prior to Admission medications   Medication Sig Start Date End Date Taking? Authorizing Provider  Cetirizine HCl (ZYRTEC ALLERGY PO) Take 1 tablet by mouth as needed.   Yes [provider]  clobetasol (TEMOVATE) AB-123456789 % GEL 1 application to affected area Externally Twice a day for 30 days   Yes [provider]  dextromethorphan-guaiFENesin (MUCINEX DM) 30-600 MG 12hr tablet Take 1 tablet by mouth 2 (two) times daily as needed for cough.   Yes [provider]  inFLIXimab (REMICADE) 100 MG injection 100 mg.   Yes [provider]    No Known Allergies  Patient Active Problem List   Diagnosis Date Noted   Seizure disorder ([place]) 03/08/2021   Cervical radiculopathy 11/23/2017   HYPERCHOLESTEROLEMIA 11/20/2008   Psoriatic arthritis ([place]) 11/20/2008    Past Medical History:  Diagnosis Date   Anemia    Arthralgia    Seasonal allergies    Tinea pedis     No past surgical history on file.  Social History   Socioeconomic History   Marital status: Single    Spouse name: Not on file   Number of children: Not on file   Years of education: Not on file    Highest education level: Not on file  Occupational History   Occupation: Architect  Tobacco Use   Smoking status: Former    Types: Cigars    Quit date: 06/05/2010    Years since quitting: 12.1   Smokeless tobacco: Never  Substance and Sexual Activity   Alcohol use: Yes    Comment: 1-2 times per month   Drug use: No   Sexual activity: Not on file  Other Topics Concern   Not on file  Social History Narrative   Not on file   Social Determinants of Health   Financial Resource Strain: Not on file  Food Insecurity: Not on file  Transportation Needs: Not on file  Physical Activity: Not on file  Stress: Not on file  Social Connections: Not on file  Intimate Partner Violence: Not on file    Family History  Problem Relation Age of Onset   Hyperlipidemia Father    Sleep apnea Father    Thyroid disease Father      Review of Systems  Constitutional:  Positive for chills and fever.  HENT:  Positive for congestion and sore throat.   Respiratory:  Positive for cough. Negative for shortness of breath and wheezing.   Cardiovascular:  Negative for chest pain and palpitations.  Gastrointestinal:  Negative for abdominal pain, diarrhea, nausea and vomiting.  Genitourinary: Negative.  Negative for dysuria and hematuria.  Musculoskeletal:  Positive  for myalgias.  Skin:  Negative for rash.  Neurological:  Positive for headaches.   Today's Vitals   07/25/22 1320  BP: 110/74  Pulse: (!) 106  Temp: 99.7 F (37.6 C)  TempSrc: Oral  SpO2: 94%  Weight: 210 lb 6 oz (95.4 kg)  Height: 5' 6"$  (1.676 m)   Body mass index is 33.96 kg/m.   Physical Exam Vitals reviewed.  Constitutional:      Appearance: Normal appearance.  HENT:     Head: Normocephalic.     Mouth/Throat:     Pharynx: Oropharyngeal exudate and posterior oropharyngeal erythema present.     Comments: Swollen tonsils bilaterally.  No sign of peritonsillar abscess. Eyes:     Extraocular Movements: Extraocular  movements intact.     Pupils: Pupils are equal, round, and reactive to light.  Cardiovascular:     Rate and Rhythm: Normal rate and regular rhythm.     Heart sounds: Normal heart sounds.  Pulmonary:     Effort: Pulmonary effort is normal.     Breath sounds: Normal breath sounds.  Abdominal:     Palpations: Abdomen is soft.     Tenderness: There is no abdominal tenderness.  Musculoskeletal:        General: Normal range of motion.     Cervical back: No rigidity or tenderness.  Lymphadenopathy:     Cervical: Cervical adenopathy present.  Skin:    General: Skin is warm and dry.     Findings: No rash.  Neurological:     Mental Status: He is alert and oriented to person, place, and time.  Psychiatric:        Mood and Affect: Mood normal.        Behavior: Behavior normal.    Results for orders placed or performed in visit on 07/25/22 (from the past 24 hour(s))  POCT rapid strep A     Status: Normal   Collection Time: 07/25/22  2:07 PM  Result Value Ref Range   Rapid Strep A Screen Negative Negative  POCT Influenza A/B     Status: Normal   Collection Time: 07/25/22  2:08 PM  Result Value Ref Range   Influenza A, POC Negative Negative   Influenza B, POC Negative Negative  POC COVID-19     Status: Normal   Collection Time: 07/25/22  2:08 PM  Result Value Ref Range   SARS Coronavirus 2 Ag Negative Negative     ASSESSMENT & PLAN: Problem List Items Addressed This Visit       Respiratory   Acute tonsillitis - Primary    Has clinical criteria for strep throat but strep test is negative. Will start Augmentin 875 mg twice a day for 7 days Differential diagnosis includes mononucleosis.  Monotest done today. Advised to rest and stay well-hydrated. ED precautions given. Advised to contact the office if no better or worse during the next several days      Relevant Medications   amoxicillin-clavulanate (AUGMENTIN) 875-125 MG tablet   Other Relevant Orders   POCT rapid strep A    POC COVID-19   POCT Influenza A/B   CBC with Differential/Platelet   Comprehensive metabolic panel     Other   Flu-like symptoms    Advised to rest and stay well-hydrated. Viral illness most likely.  Negative COVID and flu test. May take Tylenol and or Advil as needed       Relevant Orders   POCT rapid strep A   POC COVID-19  POCT Influenza A/B   CBC with Differential/Platelet   Comprehensive metabolic panel   Patient Instructions  Pharyngitis  Pharyngitis is a sore throat (pharynx). This is when there is redness, pain, and swelling in your throat. Most of the time, this condition gets better on its own. In some cases, you may need medicine. What are the causes? An infection from a virus. An infection from bacteria. Allergies. What increases the risk? Being 75-6 years old. Being in crowded environments. These include: Daycares. Schools. Dormitories. Living in a place with cold temperatures outside. Having a weakened disease-fighting (immune) system. What are the signs or symptoms? Symptoms may vary depending on the cause. Common symptoms include: Sore throat. Tiredness (fatigue). Low-grade fever. Stuffy nose. Cough. Headache. Other symptoms may include: Glands in the neck (lymph nodes) that are swollen. Skin rashes. Film on the throat or tonsils. This can be caused by an infection from bacteria. Vomiting. Red, itchy eyes. Loss of appetite. Joint pain and muscle aches. Tonsils that are temporarily bigger than usual (enlarged). How is this treated? Many times, treatment is not needed. This condition usually gets better in 3-4 days without treatment. If the infection is caused by a bacteria, you may be need to take antibiotics. Follow these instructions at home: Medicines Take over-the-counter and prescription medicines only as told by your doctor. If you were prescribed an antibiotic medicine, take it as told by your doctor. Do not stop taking the  antibiotic even if you start to feel better. Use throat lozenges or sprays to soothe your throat as told by your doctor. Children can get pharyngitis. Do not give your child aspirin. Managing pain To help with pain, try: Sipping warm liquids, such as: Broth. Herbal tea. Warm water. Eating or drinking cold or frozen liquids, such as frozen ice pops. Rinsing your mouth (gargle) with a salt water mixture 3-4 times a day or as needed. To make salt water, dissolve -1 tsp (3-6 g) of salt in 1 cup (237 mL) of warm water. Do not swallow this mixture. Sucking on hard candy or throat lozenges. Putting a cool-mist humidifier in your bedroom at night to moisten the air. Sitting in the bathroom with the door closed for 5-10 minutes while you run hot water in the shower.  General instructions  Do not smoke or use any products that contain nicotine or tobacco. If you need help quitting, ask your doctor. Rest as told by your doctor. Drink enough fluid to keep your pee (urine) pale yellow. How is this prevented? Wash your hands often for at least 20 seconds with soap and water. If soap and water are not available, use hand sanitizer. Do not touch your eyes, nose, or mouth with unwashed hands. Wash hands after touching these areas. Do not share cups or eating utensils. Avoid close contact with people who are sick. Contact a doctor if: You have large, tender lumps in your neck. You have a rash. You cough up green, yellow-brown, or bloody spit. Get help right away if: You have a stiff neck. You drool or cannot swallow liquids. You cannot drink or take medicines without vomiting. You have very bad pain that does not go away with medicine. You have problems breathing, and it is not from a stuffy nose. You have new pain and swelling in your knees, ankles, wrists, or elbows. These symptoms may be an emergency. Get help right away. Call your local emergency services (911 in the [place] Do not wait to  see if the  symptoms will go away. Do not drive yourself to the hospital. Summary Pharyngitis is a sore throat (pharynx). This is when there is redness, pain, and swelling in your throat. Most of the time, pharyngitis gets better on its own. Sometimes, you may need medicine. If you were prescribed an antibiotic medicine, take it as told by your doctor. Do not stop taking the antibiotic even if you start to feel better. This information is not intended to replace advice given to you by your health care provider. Make sure you discuss any questions you have with your health care provider. Document Revised: 08/18/2020 Document Reviewed: 08/18/2020 Elsevier Patient Education  [place], MD [facility]

## 2022-07-25 NOTE — Progress Notes (Signed)
Ask lab to also run Mononucleosis test please. Thanks.

## 2022-07-25 NOTE — Assessment & Plan Note (Signed)
Has clinical criteria for strep throat but strep test is negative. Will start Augmentin 875 mg twice a day for 7 days Differential diagnosis includes mononucleosis.  Monotest done today. Advised to rest and stay well-hydrated. ED precautions given. Advised to contact the office if no better or worse during the next several days

## 2022-07-29 ENCOUNTER — Emergency Department (HOSPITAL_BASED_OUTPATIENT_CLINIC_OR_DEPARTMENT_OTHER)
Admission: EM | Admit: 2022-07-29 | Discharge: 2022-07-29 | Disposition: A | Discharge status: Home / Self Care | Payer: BC Managed Care – PPO | Attending: Emergency Medicine | Admitting: Emergency Medicine

## 2022-07-29 DIAGNOSIS — D72829 Elevated white blood cell count, unspecified: Secondary | ICD-10-CM | POA: Insufficient documentation

## 2022-07-29 DIAGNOSIS — J029 Acute pharyngitis, unspecified: Secondary | ICD-10-CM | POA: Diagnosis not present

## 2022-07-29 LAB — CBC WITH DIFFERENTIAL/PLATELET
Abs Immature Granulocytes: 0.05 10*3/uL (ref 0.00–0.07)
Basophils Absolute: 0 10*3/uL (ref 0.0–0.1)
Basophils Relative: 0 %
Eosinophils Absolute: 0 10*3/uL (ref 0.0–0.5)
Eosinophils Relative: 0 %
HCT: 44.7 % (ref 39.0–52.0)
Hemoglobin: 15.2 g/dL (ref 13.0–17.0)
Immature Granulocytes: 0 %
Lymphocytes Relative: 12 %
Lymphs Abs: 1.9 10*3/uL (ref 0.7–4.0)
MCH: 24.5 pg — ABNORMAL LOW (ref 26.0–34.0)
MCHC: 34 g/dL (ref 30.0–36.0)
MCV: 72.1 fL — ABNORMAL LOW (ref 80.0–100.0)
Monocytes Absolute: 1.5 10*3/uL — ABNORMAL HIGH (ref 0.1–1.0)
Monocytes Relative: 9 %
Neutro Abs: 12.6 10*3/uL — ABNORMAL HIGH (ref 1.7–7.7)
Neutrophils Relative %: 79 %
Platelets: 391 10*3/uL (ref 150–400)
RBC: 6.2 MIL/uL — ABNORMAL HIGH (ref 4.22–5.81)
RDW: 14.2 % (ref 11.5–15.5)
WBC: 16 10*3/uL — ABNORMAL HIGH (ref 4.0–10.5)
nRBC: 0 % (ref 0.0–0.2)

## 2022-07-29 LAB — BASIC METABOLIC PANEL
Anion gap: 12 (ref 5–15)
BUN: 15 mg/dL (ref 6–20)
CO2: 25 mmol/L (ref 22–32)
Calcium: 10.1 mg/dL (ref 8.9–10.3)
Chloride: 100 mmol/L (ref 98–111)
Creatinine, Ser: 1.12 mg/dL (ref 0.61–1.24)
GFR, Estimated: >60 mL/min (ref >60)
Glucose, Bld: 96 mg/dL (ref 70–99)
Potassium: 3.9 mmol/L (ref 3.5–5.1)
Sodium: 137 mmol/L (ref 135–145)

## 2022-07-29 LAB — MONONUCLEOSIS SCREEN: Mono Screen: NEGATIVE

## 2022-07-29 LAB — GROUP A STREP BY PCR: Group A Strep by PCR: NOT DETECTED

## 2022-07-29 MED ORDER — KETOROLAC TROMETHAMINE 15 MG/ML IJ SOLN
15.0000 mg | Freq: Once | INTRAMUSCULAR | Status: AC
  Administered 2022-07-29: 15 mg via INTRAVENOUS
  Filled 2022-07-29: qty 1

## 2022-07-29 MED ORDER — NYSTATIN 100000 UNIT/ML MT SUSP: 500000.0000 [IU] | Freq: Four times a day (QID) | OROMUCOSAL | 0 refills | Status: AC

## 2022-07-29 MED ORDER — DEXAMETHASONE SODIUM PHOSPHATE 10 MG/ML IJ SOLN
10.0000 mg | Freq: Once | INTRAMUSCULAR | Status: AC
  Administered 2022-07-29: 10 mg via INTRAVENOUS
  Filled 2022-07-29: qty 1

## 2022-07-29 MED ORDER — MAGIC MOUTHWASH
10.0000 mL | Freq: Once | ORAL | Status: AC
  Administered 2022-07-29: 10 mL via ORAL
  Filled 2022-07-29 (×2): qty 10

## 2022-07-29 MED ORDER — SODIUM CHLORIDE 0.9 % IV BOLUS
1000.0000 mL | Freq: Once | INTRAVENOUS | Status: AC
  Administered 2022-07-29: 1000 mL via INTRAVENOUS

## 2022-07-29 MED ORDER — IBUPROFEN 100 MG/5ML PO SUSP: 400.0000 mg | Freq: Four times a day (QID) | ORAL | 0 refills | Status: DC | PRN

## 2022-07-29 MED ORDER — LIDOCAINE VISCOUS HCL 2 % MT SOLN: 5.0000 mL | Freq: Four times a day (QID) | OROMUCOSAL | 0 refills | Status: DC | PRN

## 2022-07-29 NOTE — ED Triage Notes (Addendum)
Pt c/o tongue, interior cheek and lip pain related to white bumps and ulcers for past 4 days.   Pt seen by PMD for sore throat on Tuesday - treated with amoxicillian though covid/flu/strep tests were all negative at that time.

## 2022-07-29 NOTE — ED Provider Notes (Signed)
[facility] Provider Note   CSN: [id number] Arrival date & time: 07/29/22  [id number]     History  Chief Complaint  Patient presents with   Oral Pain    Darren Ramirez is a 37 y.o. male.  HPI    37 year old patient comes in with chief complaint of oral pain.  Patient indicates that for the last 6 days he has had sore throat.  He saw his PCP 4 to 5 days ago.  He was prescribed amoxicillin.  However over the last 3 days he has noted that he is having ulcers in his lips, tongue and around the mouth.  Pain is severe, limiting him to liquid intake only.  He denies any difficulty in breathing.  He has no rash to his hands, feet, torso and denies any visual complaints.  He has no known allergies.  Home Medications Prior to Admission medications   Medication Sig Start Date End Date Taking? Authorizing Provider  ibuprofen (ADVIL) 100 MG/5ML suspension Take 20 mLs (400 mg total) by mouth every 6 (six) hours as needed. 07/29/22  Yes Varney Biles, MD  magic mouthwash (lidocaine, diphenhydrAMINE, alum & mag hydroxide) suspension Swish and spit 5 mLs 4 (four) times daily as needed for mouth pain. 07/29/22  Yes Varney Biles, MD  nystatin (MYCOSTATIN) 100000 UNIT/ML suspension Take 5 mLs (500,000 Units total) by mouth 4 (four) times daily for 7 days. 07/29/22 08/05/22 Yes Varney Biles, MD  amoxicillin-clavulanate (AUGMENTIN) 875-125 MG tablet Take 1 tablet by mouth 2 (two) times daily for 7 days. 07/25/22 08/01/22  Horald Pollen, MD  Cetirizine HCl (ZYRTEC ALLERGY PO) Take 1 tablet by mouth as needed.    [provider]  clobetasol (TEMOVATE) AB-123456789 % GEL 1 application to affected area Externally Twice a day for 30 days    [provider]  dextromethorphan-guaiFENesin (MUCINEX DM) 30-600 MG 12hr tablet Take 1 tablet by mouth 2 (two) times daily as needed for cough.    [provider]  inFLIXimab (REMICADE) 100 MG injection  100 mg.    [provider]      Allergies    Patient has no known allergies.    Review of Systems   Review of Systems  Constitutional:  Positive for fever.  HENT:  Positive for sore throat. Negative for drooling.   Eyes:  Negative for visual disturbance.  Respiratory:  Negative for chest tightness and shortness of breath.   Cardiovascular:  Negative for chest pain.  Gastrointestinal:  Negative for abdominal distention.  Genitourinary:  Negative for difficulty urinating, dysuria and enuresis.  Musculoskeletal:  Negative for arthralgias and neck pain.  Neurological:  Positive for dizziness. Negative for light-headedness and headaches.  Psychiatric/Behavioral:  Negative for confusion.   All other systems reviewed and are negative.   Physical Exam Updated Vital Signs BP 134/85 (BP Location: Right Arm)   Pulse (!) 115   Temp 100.1 F (37.8 C)   Resp 20   SpO2 98%  Physical Exam  ED Results / Procedures / Treatments   Labs (all labs ordered are listed, but only abnormal results are displayed) Labs Reviewed  CBC WITH DIFFERENTIAL/PLATELET - Abnormal; Notable for the following components:      Result Value   WBC 16.0 (*)    RBC 6.20 (*)    MCV 72.1 (*)    MCH 24.5 (*)    Neutro Abs 12.6 (*)    Monocytes Absolute 1.5 (*)  All other components within normal limits  GROUP A STREP BY PCR  MONONUCLEOSIS SCREEN  BASIC METABOLIC PANEL    EKG None  Radiology No results found.  Procedures Procedures    Medications Ordered in ED Medications  ketorolac (TORADOL) 15 MG/ML injection 15 mg (15 mg Intravenous Given 07/29/22 1021)  dexamethasone (DECADRON) injection 10 mg (10 mg Intravenous Given 07/29/22 1031)  sodium chloride 0.9 % bolus 1,000 mL (0 mLs Intravenous Stopped 07/29/22 1145)  magic mouthwash (10 mLs Oral Given 07/29/22 1030)    ED Course/ Medical Decision Making/ A&P                             Medical Decision Making Amount and/or Complexity of  Data Reviewed Labs: ordered.  Risk Prescription drug management.   37 year old patient comes in with chief complaint of sore throat. It appears that patient started with sore throat, took some antibiotics and soon after started noticing ulcers and lesions over his mouth.  He has no eye complaints, there is no rash over his body including specifically over his hands or feet.  He denies any high risk behavior in terms of STI risk factors or substance use history.  On exam, patient nontoxic.  However he has ulcers to his lip, there is white patch over his hard palate and patient has enlarged tonsils with exudates.  No trismus.  No stridor.  Patient does not appear toxic.  Differential diagnosis includes strep pharyngitis, viral illness, coxsackie viral disease, mono, Candida infection I also considered Stevens-Johnson syndrome.  Plan is to place an IV, given some IV fluids along with Decadron and Toradol.  Will also give him Magic mouthwash.  I will retest him for strep.   12:43 PM Patient has passed oral challenge.  I have independently reviewed the labs.  His strep test is negative.  His labs are overall reassuring besides elevated white count.  Patient feels a lot better with Magic mouthwash.  Advised that we will treat him with nystatin because we suspect there could be some evidence of Candida infection.  Additionally we will give him Magic mouthwash and oral ibuprofen.  Advised follow-up with PCP if the symptoms do not improve.  We also discussed return precautions in case he is severely dehydrated, has difficulty in swallowing or breathing.  Final Clinical Impression(s) / ED Diagnoses Final diagnoses:  Pharyngitis, unspecified etiology    Rx / DC Orders ED Discharge Orders          Ordered    nystatin (MYCOSTATIN) 100000 UNIT/ML suspension  4 times daily        07/29/22 1234    ibuprofen (ADVIL) 100 MG/5ML suspension  Every 6 hours PRN        07/29/22 1234    magic mouthwash  (lidocaine, diphenhydrAMINE, alum & mag hydroxide) suspension  4 times daily PRN        07/29/22 1235              Varney Biles, MD 07/29/22 1243

## 2022-07-29 NOTE — Discharge Instructions (Addendum)
You are seen in the emergency room for sore throat.  It appears that you have some oral ulcers and also pharyngitis.  Most likely her symptoms are because of a viral illness.  It is possible that you have some oral thrush as well.  Take the medications prescribed, it should help you with pain, thrush treatment.  Return to the ER if you start having worsening pain, difficulty in swallowing, severe dehydration, difficulty in breathing.

## 2022-07-31 ENCOUNTER — Telehealth: Payer: Self-pay

## 2022-07-31 NOTE — Transitions of Care (Post Inpatient/ED Visit) (Unsigned)
   07/31/2022  Name: Darren Ramirez MRN: [id number] DOB: [birth year]  Today's TOC FU Call Status: Today's TOC FU Call Status:: Unsuccessul Call (1st Attempt) Unsuccessful Call (1st Attempt) Date: 07/31/22  Attempted to reach the patient regarding the most recent Inpatient/ED visit.  Follow Up Plan: Additional outreach attempts will be made to reach the patient to complete the Transitions of Care (Post Inpatient/ED visit) call.   Signature Juanda Crumble, [place] Direct Dial [phone]

## 2022-08-02 NOTE — Transitions of Care (Post Inpatient/ED Visit) (Signed)
   08/02/2022  Name: Darren Ramirez MRN: [id number] DOB: 24-Oct-1985  Today's TOC FU Call Status: Today's TOC FU Call Status:: Unsuccessful Call (2nd Attempt) Unsuccessful Call (1st Attempt) Date: 07/31/22 Unsuccessful Call (2nd Attempt) Date: 08/02/22  Attempted to reach the patient regarding the most recent Inpatient/ED visit.  Follow Up Plan: No further outreach attempts will be made at this time. We have been unable to contact the patient.  Signature Juanda Crumble, [place] Direct Dial [phone]

## 2022-09-30 ENCOUNTER — Encounter (HOSPITAL_BASED_OUTPATIENT_CLINIC_OR_DEPARTMENT_OTHER): Payer: Self-pay | Admitting: Emergency Medicine

## 2022-09-30 ENCOUNTER — Emergency Department (HOSPITAL_BASED_OUTPATIENT_CLINIC_OR_DEPARTMENT_OTHER): Payer: BC Managed Care – PPO

## 2022-09-30 ENCOUNTER — Inpatient Hospital Stay (HOSPITAL_BASED_OUTPATIENT_CLINIC_OR_DEPARTMENT_OTHER)
Admission: EM | Admit: 2022-09-30 | Discharge: 2022-10-04 | DRG: 872 | Disposition: A | Discharge status: Home / Self Care | Payer: BC Managed Care – PPO | Attending: Internal Medicine | Admitting: Internal Medicine

## 2022-09-30 ENCOUNTER — Inpatient Hospital Stay (HOSPITAL_COMMUNITY): Payer: BC Managed Care – PPO

## 2022-09-30 ENCOUNTER — Other Ambulatory Visit: Payer: Self-pay

## 2022-09-30 DIAGNOSIS — J302 Other seasonal allergic rhinitis: Secondary | ICD-10-CM | POA: Diagnosis present

## 2022-09-30 DIAGNOSIS — Z1152 Encounter for screening for COVID-19: Secondary | ICD-10-CM

## 2022-09-30 DIAGNOSIS — G40909 Epilepsy, unspecified, not intractable, without status epilepticus: Secondary | ICD-10-CM | POA: Diagnosis present

## 2022-09-30 DIAGNOSIS — E663 Overweight: Secondary | ICD-10-CM | POA: Diagnosis present

## 2022-09-30 DIAGNOSIS — M272 Inflammatory conditions of jaws: Secondary | ICD-10-CM | POA: Diagnosis present

## 2022-09-30 DIAGNOSIS — D509 Iron deficiency anemia, unspecified: Secondary | ICD-10-CM | POA: Diagnosis present

## 2022-09-30 DIAGNOSIS — A409 Streptococcal sepsis, unspecified: Principal | ICD-10-CM | POA: Diagnosis present

## 2022-09-30 DIAGNOSIS — Z713 Dietary counseling and surveillance: Secondary | ICD-10-CM | POA: Diagnosis not present

## 2022-09-30 DIAGNOSIS — Z83438 Family history of other disorder of lipoprotein metabolism and other lipidemia: Secondary | ICD-10-CM | POA: Diagnosis not present

## 2022-09-30 DIAGNOSIS — L409 Psoriasis, unspecified: Secondary | ICD-10-CM | POA: Diagnosis present

## 2022-09-30 DIAGNOSIS — E876 Hypokalemia: Secondary | ICD-10-CM | POA: Diagnosis present

## 2022-09-30 DIAGNOSIS — E669 Obesity, unspecified: Secondary | ICD-10-CM | POA: Diagnosis present

## 2022-09-30 DIAGNOSIS — Z87891 Personal history of nicotine dependence: Secondary | ICD-10-CM

## 2022-09-30 DIAGNOSIS — L4050 Arthropathic psoriasis, unspecified: Secondary | ICD-10-CM | POA: Diagnosis present

## 2022-09-30 DIAGNOSIS — L03211 Cellulitis of face: Secondary | ICD-10-CM | POA: Diagnosis present

## 2022-09-30 DIAGNOSIS — E7800 Pure hypercholesterolemia, unspecified: Secondary | ICD-10-CM

## 2022-09-30 DIAGNOSIS — Z6831 Body mass index (BMI) 31.0-31.9, adult: Secondary | ICD-10-CM | POA: Diagnosis not present

## 2022-09-30 DIAGNOSIS — E878 Other disorders of electrolyte and fluid balance, not elsewhere classified: Secondary | ICD-10-CM | POA: Diagnosis present

## 2022-09-30 DIAGNOSIS — N179 Acute kidney failure, unspecified: Secondary | ICD-10-CM | POA: Diagnosis present

## 2022-09-30 DIAGNOSIS — A419 Sepsis, unspecified organism: Secondary | ICD-10-CM | POA: Diagnosis present

## 2022-09-30 DIAGNOSIS — I4519 Other right bundle-branch block: Secondary | ICD-10-CM | POA: Diagnosis present

## 2022-09-30 DIAGNOSIS — R7881 Bacteremia: Secondary | ICD-10-CM | POA: Diagnosis not present

## 2022-09-30 DIAGNOSIS — D638 Anemia in other chronic diseases classified elsewhere: Secondary | ICD-10-CM | POA: Diagnosis present

## 2022-09-30 DIAGNOSIS — I959 Hypotension, unspecified: Secondary | ICD-10-CM | POA: Diagnosis present

## 2022-09-30 DIAGNOSIS — Z880 Allergy status to penicillin: Secondary | ICD-10-CM

## 2022-09-30 DIAGNOSIS — R6520 Severe sepsis without septic shock: Secondary | ICD-10-CM | POA: Diagnosis present

## 2022-09-30 DIAGNOSIS — Z79899 Other long term (current) drug therapy: Secondary | ICD-10-CM | POA: Diagnosis not present

## 2022-09-30 DIAGNOSIS — B955 Unspecified streptococcus as the cause of diseases classified elsewhere: Secondary | ICD-10-CM

## 2022-09-30 DIAGNOSIS — R8290 Unspecified abnormal findings in urine: Secondary | ICD-10-CM | POA: Diagnosis not present

## 2022-09-30 LAB — BASIC METABOLIC PANEL
Anion gap: 10 (ref 5–15)
BUN: 12 mg/dL (ref 6–20)
CO2: 26 mmol/L (ref 22–32)
Calcium: 8.8 mg/dL — ABNORMAL LOW (ref 8.9–10.3)
Chloride: 100 mmol/L (ref 98–111)
Creatinine, Ser: 1.42 mg/dL — ABNORMAL HIGH (ref 0.61–1.24)
GFR, Estimated: >60 mL/min (ref >60)
Glucose, Bld: 111 mg/dL — ABNORMAL HIGH (ref 70–99)
Potassium: 3.5 mmol/L (ref 3.5–5.1)
Sodium: 136 mmol/L (ref 135–145)

## 2022-09-30 LAB — CBC WITH DIFFERENTIAL/PLATELET
Abs Immature Granulocytes: 0.22 10*3/uL — ABNORMAL HIGH (ref 0.00–0.07)
Basophils Absolute: 0 10*3/uL (ref 0.0–0.1)
Basophils Relative: 0 %
Eosinophils Absolute: 0 10*3/uL (ref 0.0–0.5)
Eosinophils Relative: 0 %
HCT: 34.7 % — ABNORMAL LOW (ref 39.0–52.0)
Hemoglobin: 11.7 g/dL — ABNORMAL LOW (ref 13.0–17.0)
Immature Granulocytes: 1 %
Lymphocytes Relative: 5 %
Lymphs Abs: 0.9 10*3/uL (ref 0.7–4.0)
MCH: 24.1 pg — ABNORMAL LOW (ref 26.0–34.0)
MCHC: 33.7 g/dL (ref 30.0–36.0)
MCV: 71.5 fL — ABNORMAL LOW (ref 80.0–100.0)
Monocytes Absolute: 1.3 10*3/uL — ABNORMAL HIGH (ref 0.1–1.0)
Monocytes Relative: 6 %
Neutro Abs: 17.6 10*3/uL — ABNORMAL HIGH (ref 1.7–7.7)
Neutrophils Relative %: 88 %
Platelets: 329 10*3/uL (ref 150–400)
RBC: 4.85 MIL/uL (ref 4.22–5.81)
RDW: 14.3 % (ref 11.5–15.5)
WBC: 20.1 10*3/uL — ABNORMAL HIGH (ref 4.0–10.5)
nRBC: 0 % (ref 0.0–0.2)

## 2022-09-30 LAB — CK: Total CK: 236 U/L (ref 49–397)

## 2022-09-30 LAB — URINALYSIS, W/ REFLEX TO CULTURE (INFECTION SUSPECTED)
Bacteria, UA: NONE SEEN
Bilirubin Urine: NEGATIVE
Glucose, UA: NEGATIVE mg/dL
Ketones, ur: NEGATIVE mg/dL
Leukocytes,Ua: NEGATIVE
Nitrite: NEGATIVE
Protein, ur: 30 mg/dL — AB
Specific Gravity, Urine: 1.011 (ref 1.005–1.030)
pH: 5.5 (ref 5.0–8.0)

## 2022-09-30 LAB — COMPREHENSIVE METABOLIC PANEL
ALT: 33 U/L (ref 0–44)
AST: 32 U/L (ref 15–41)
Albumin: 3.8 g/dL (ref 3.5–5.0)
Alkaline Phosphatase: 64 U/L (ref 38–126)
Anion gap: 15 (ref 5–15)
BUN: 20 mg/dL (ref 6–20)
CO2: 24 mmol/L (ref 22–32)
Calcium: 9.2 mg/dL (ref 8.9–10.3)
Chloride: 96 mmol/L — ABNORMAL LOW (ref 98–111)
Creatinine, Ser: 1.72 mg/dL — ABNORMAL HIGH (ref 0.61–1.24)
GFR, Estimated: 52 mL/min — ABNORMAL LOW (ref >60)
Glucose, Bld: 143 mg/dL — ABNORMAL HIGH (ref 70–99)
Potassium: 2.8 mmol/L — ABNORMAL LOW (ref 3.5–5.1)
Sodium: 135 mmol/L (ref 135–145)
Total Bilirubin: 0.6 mg/dL (ref 0.3–1.2)
Total Protein: 7.6 g/dL (ref 6.5–8.1)

## 2022-09-30 LAB — APTT: aPTT: 47 seconds — ABNORMAL HIGH (ref 24–36)

## 2022-09-30 LAB — PROTIME-INR
INR: 1.2 (ref 0.8–1.2)
Prothrombin Time: 15.2 seconds (ref 11.4–15.2)

## 2022-09-30 LAB — HEMOGLOBIN AND HEMATOCRIT, BLOOD
HCT: 31 % — ABNORMAL LOW (ref 39.0–52.0)
HCT: 29.4 % — ABNORMAL LOW (ref 39.0–52.0)
Hemoglobin: 10.6 g/dL — ABNORMAL LOW (ref 13.0–17.0)
Hemoglobin: 9.8 g/dL — ABNORMAL LOW (ref 13.0–17.0)

## 2022-09-30 LAB — RESP PANEL BY RT-PCR (RSV, FLU A&B, COVID)  RVPGX2
Influenza A by PCR: NEGATIVE
Influenza B by PCR: NEGATIVE
Resp Syncytial Virus by PCR: NEGATIVE
SARS Coronavirus 2 by RT PCR: NEGATIVE

## 2022-09-30 LAB — TYPE AND SCREEN
ABO/RH(D): B POS
Antibody Screen: NEGATIVE

## 2022-09-30 LAB — MRSA NEXT GEN BY PCR, NASAL: MRSA by PCR Next Gen: NOT DETECTED

## 2022-09-30 LAB — IRON AND TIBC
Iron: 8 ug/dL — ABNORMAL LOW (ref 45–182)
Saturation Ratios: 4 % — ABNORMAL LOW (ref 17.9–39.5)
TIBC: 189 ug/dL — ABNORMAL LOW (ref 250–450)
UIBC: 181 ug/dL

## 2022-09-30 LAB — LACTIC ACID, PLASMA: Lactic Acid, Venous: 1.8 mmol/L (ref 0.5–1.9)

## 2022-09-30 LAB — FERRITIN: Ferritin: 786 ng/mL — ABNORMAL HIGH (ref 24–336)

## 2022-09-30 LAB — ABO/RH: ABO/RH(D): B POS

## 2022-09-30 LAB — OCCULT BLOOD X 1 CARD TO LAB, STOOL: Fecal Occult Bld: POSITIVE — AB

## 2022-09-30 MED ORDER — LACTATED RINGERS IV SOLN
INTRAVENOUS | Status: DC
  Administered 2022-09-30: 150 mL/h via INTRAVENOUS

## 2022-09-30 MED ORDER — VANCOMYCIN HCL 1750 MG/350ML IV SOLN
1750.0000 mg | Freq: Once | INTRAVENOUS | Status: DC
  Filled 2022-09-30: qty 350

## 2022-09-30 MED ORDER — VANCOMYCIN HCL IN DEXTROSE 1-5 GM/200ML-% IV SOLN
1000.0000 mg | INTRAVENOUS | Status: DC
  Administered 2022-10-01: 1000 mg via INTRAVENOUS
  Filled 2022-09-30: qty 200

## 2022-09-30 MED ORDER — IBUPROFEN 600 MG PO TABS: 600.0000 mg | ORAL_TABLET | Freq: Four times a day (QID) | ORAL | Status: DC | PRN

## 2022-09-30 MED ORDER — PANTOPRAZOLE SODIUM 40 MG PO TBEC: 40.0000 mg | DELAYED_RELEASE_TABLET | Freq: Every day | ORAL | Status: DC

## 2022-09-30 MED ORDER — DEXAMETHASONE SODIUM PHOSPHATE 10 MG/ML IJ SOLN
10.0000 mg | Freq: Once | INTRAMUSCULAR | Status: AC
  Administered 2022-09-30: 10 mg via INTRAVENOUS
  Filled 2022-09-30: qty 1

## 2022-09-30 MED ORDER — ALBUTEROL SULFATE (2.5 MG/3ML) 0.083% IN NEBU: 2.5000 mg | INHALATION_SOLUTION | Freq: Four times a day (QID) | RESPIRATORY_TRACT | Status: DC | PRN

## 2022-09-30 MED ORDER — PROCHLORPERAZINE EDISYLATE 10 MG/2ML IJ SOLN: 10.0000 mg | Freq: Four times a day (QID) | INTRAMUSCULAR | Status: DC | PRN

## 2022-09-30 MED ORDER — METRONIDAZOLE 500 MG/100ML IV SOLN
500.0000 mg | Freq: Two times a day (BID) | INTRAVENOUS | Status: DC
  Administered 2022-09-30 – 2022-10-04 (×8): 500 mg via INTRAVENOUS
  Filled 2022-09-30 (×8): qty 100

## 2022-09-30 MED ORDER — POTASSIUM CHLORIDE CRYS ER 20 MEQ PO TBCR
20.0000 meq | EXTENDED_RELEASE_TABLET | ORAL | Status: AC
  Administered 2022-09-30: 20 meq via ORAL
  Filled 2022-09-30: qty 1

## 2022-09-30 MED ORDER — SODIUM CHLORIDE 0.9 % IV BOLUS
500.0000 mL | Freq: Once | INTRAVENOUS | Status: AC
  Administered 2022-09-30: 500 mL via INTRAVENOUS

## 2022-09-30 MED ORDER — ONDANSETRON HCL 4 MG PO TABS: 4.0000 mg | ORAL_TABLET | Freq: Four times a day (QID) | ORAL | Status: DC | PRN

## 2022-09-30 MED ORDER — ACETAMINOPHEN 325 MG PO TABS
650.0000 mg | ORAL_TABLET | Freq: Four times a day (QID) | ORAL | Status: DC | PRN
  Administered 2022-09-30 – 2022-10-04 (×4): 650 mg via ORAL
  Filled 2022-09-30 (×5): qty 2

## 2022-09-30 MED ORDER — PREDNISONE 50 MG PO TABS: 60.0000 mg | ORAL_TABLET | Freq: Every day | ORAL | Status: DC

## 2022-09-30 MED ORDER — POTASSIUM CHLORIDE CRYS ER 20 MEQ PO TBCR
40.0000 meq | EXTENDED_RELEASE_TABLET | Freq: Once | ORAL | Status: AC
  Administered 2022-09-30: 40 meq via ORAL
  Filled 2022-09-30: qty 2

## 2022-09-30 MED ORDER — VANCOMYCIN HCL IN DEXTROSE 1-5 GM/200ML-% IV SOLN: 1000.0000 mg | Freq: Once | INTRAVENOUS | Status: DC

## 2022-09-30 MED ORDER — SODIUM CHLORIDE 0.9 % IV SOLN: INTRAVENOUS | Status: DC

## 2022-09-30 MED ORDER — HYDROCODONE-ACETAMINOPHEN 5-325 MG PO TABS
2.0000 | ORAL_TABLET | Freq: Once | ORAL | Status: AC
  Administered 2022-09-30: 2 via ORAL
  Filled 2022-09-30: qty 2

## 2022-09-30 MED ORDER — LIDOCAINE VISCOUS HCL 2 % MT SOLN: 5.0000 mL | Freq: Four times a day (QID) | OROMUCOSAL | Status: DC | PRN

## 2022-09-30 MED ORDER — ACETAMINOPHEN 500 MG PO TABS
1000.0000 mg | ORAL_TABLET | Freq: Once | ORAL | Status: AC
  Administered 2022-09-30: 1000 mg via ORAL
  Filled 2022-09-30: qty 2

## 2022-09-30 MED ORDER — CALCIUM GLUCONATE-NACL 1-0.675 GM/50ML-% IV SOLN
1.0000 g | Freq: Once | INTRAVENOUS | Status: AC
  Administered 2022-09-30: 1000 mg via INTRAVENOUS
  Filled 2022-09-30: qty 50

## 2022-09-30 MED ORDER — POTASSIUM CHLORIDE CRYS ER 20 MEQ PO TBCR: 40.0000 meq | EXTENDED_RELEASE_TABLET | ORAL | Status: DC

## 2022-09-30 MED ORDER — PANTOPRAZOLE SODIUM 40 MG IV SOLR
40.0000 mg | Freq: Two times a day (BID) | INTRAVENOUS | Status: DC
  Administered 2022-09-30 – 2022-10-02 (×5): 40 mg via INTRAVENOUS
  Filled 2022-09-30 (×5): qty 10

## 2022-09-30 MED ORDER — IBUPROFEN 600 MG PO TABS
600.0000 mg | ORAL_TABLET | Freq: Once | ORAL | Status: AC
  Administered 2022-09-30: 600 mg via ORAL
  Filled 2022-09-30: qty 3

## 2022-09-30 MED ORDER — LACTATED RINGERS IV BOLUS (SEPSIS)
1000.0000 mL | Freq: Once | INTRAVENOUS | Status: AC
  Administered 2022-09-30: 1000 mL via INTRAVENOUS

## 2022-09-30 MED ORDER — ACETAMINOPHEN 650 MG RE SUPP
650.0000 mg | Freq: Four times a day (QID) | RECTAL | Status: DC | PRN
  Administered 2022-09-30: 650 mg via RECTAL
  Filled 2022-09-30 (×2): qty 1

## 2022-09-30 MED ORDER — LACTATED RINGERS IV SOLN: INTRAVENOUS | Status: AC

## 2022-09-30 MED ORDER — ONDANSETRON HCL 4 MG/2ML IJ SOLN
4.0000 mg | Freq: Once | INTRAMUSCULAR | Status: AC
  Administered 2022-09-30: 4 mg via INTRAVENOUS
  Filled 2022-09-30: qty 2

## 2022-09-30 MED ORDER — VANCOMYCIN HCL IN DEXTROSE 1-5 GM/200ML-% IV SOLN
1000.0000 mg | INTRAVENOUS | Status: AC
  Administered 2022-09-30 (×2): 1000 mg via INTRAVENOUS
  Filled 2022-09-30 (×2): qty 200

## 2022-09-30 MED ORDER — IOHEXOL 350 MG/ML SOLN
75.0000 mL | Freq: Once | INTRAVENOUS | Status: AC | PRN
  Administered 2022-09-30: 75 mL via INTRAVENOUS

## 2022-09-30 MED ORDER — SODIUM CHLORIDE 0.9 % IV SOLN
2.0000 g | Freq: Three times a day (TID) | INTRAVENOUS | Status: DC
  Administered 2022-09-30 – 2022-10-02 (×6): 2 g via INTRAVENOUS
  Filled 2022-09-30 (×6): qty 12.5

## 2022-09-30 MED ORDER — PREDNISONE 50 MG PO TABS
60.0000 mg | ORAL_TABLET | Freq: Every day | ORAL | Status: DC
  Administered 2022-10-01 – 2022-10-04 (×4): 60 mg via ORAL
  Filled 2022-09-30 (×4): qty 1

## 2022-09-30 MED ORDER — HYDROCODONE-ACETAMINOPHEN 5-325 MG PO TABS: 1.0000 | ORAL_TABLET | Freq: Four times a day (QID) | ORAL | Status: DC | PRN

## 2022-09-30 MED ORDER — LACTATED RINGERS IV BOLUS (SEPSIS)
1000.0000 mL | Freq: Once | INTRAVENOUS | Status: AC
  Administered 2022-09-30 (×2): 1000 mL via INTRAVENOUS

## 2022-09-30 MED ORDER — SODIUM CHLORIDE 0.9 % IV SOLN
2.0000 g | Freq: Once | INTRAVENOUS | Status: AC
  Administered 2022-09-30: 2 g via INTRAVENOUS
  Filled 2022-09-30: qty 12.5

## 2022-09-30 MED ORDER — ONDANSETRON HCL 4 MG/2ML IJ SOLN
4.0000 mg | Freq: Four times a day (QID) | INTRAMUSCULAR | Status: DC | PRN
  Administered 2022-09-30: 4 mg via INTRAVENOUS
  Filled 2022-09-30: qty 2

## 2022-09-30 MED ORDER — METRONIDAZOLE 500 MG/100ML IV SOLN
500.0000 mg | Freq: Once | INTRAVENOUS | Status: AC
  Administered 2022-09-30: 500 mg via INTRAVENOUS
  Filled 2022-09-30 (×2): qty 100

## 2022-09-30 NOTE — Plan of Care (Signed)
  Problem: Education: Goal: Knowledge of General Education information will improve Description: Including pain rating scale, medication(s)/side effects and non-pharmacologic comfort measures Outcome: Progressing   Problem: Health Behavior/Discharge Planning: Goal: Ability to manage health-related needs will improve Outcome: Progressing   Problem: Activity: Goal: Risk for activity intolerance will decrease Outcome: Progressing   Problem: Nutrition: Goal: Adequate nutrition will be maintained Outcome: Progressing   Problem: Coping: Goal: Level of anxiety will decrease Outcome: Progressing   Problem: Elimination: Goal: Will not experience complications related to bowel motility Outcome: Progressing   Problem: Pain Managment: Goal: General experience of comfort will improve Outcome: Progressing   Problem: Safety: Goal: Ability to remain free from injury will improve Outcome: Progressing   

## 2022-09-30 NOTE — Consult Note (Signed)
NAMENephi Ramirez, MRN:  [id number], DOB:  March 31, 1986, LOS: 0 ADMISSION DATE:  09/30/2022, CONSULTATION DATE:  09/30/2022 REFERRING MD: Dr. Katrinka Ramirez - [facility], CHIEF COMPLAINT:  Sepsis    History of Present Illness:  Darren Ramirez is a 37yo male with a past medical history significant for psoriasis on Remicade and anemia who presented to [facility] with complaints of fever and chills.  Of note patient had right upper and lower wisdom teeth removed 4/11 by Dr. Leonie Ramirez, following the procedure patient reported a lot of pain.  6 days prior to admission patient began noticing worsening swelling with shaking chills and subjective fever for which he followed up with oral surgeon, anti-inflammatory medications were prescribed but no antibiotics.  On ED arrival patient was seen febrile with temperature of 102.6 mild tachycardia, mild tachypnea, and mildly hypotensive prompting admission for sepsis.  Mid afternoon 4/27 patient with again episode of fever with Tmax 103 with associated significant tachycardia.  Decision made at that time to proceed with CT maxillofacial to assess for possible abscess and PCCM was consulted for further assistance in management.  Pertinent  Medical History  Psoriasis on Remicade and anemia  Significant Hospital Events: Including procedures, antibiotic start and stop dates in addition to other pertinent events   4/2 presented with subjective fever and chills, met sepsis criteria on admission felt secondary to odontogenic infection.  CT maxillofacial obtained after recurrent fever and rigors, results currently pending   Interim History / Subjective:  As above  Objective   Blood pressure 138/78, pulse (!) 118, temperature (!) 102.2 F (39 C), temperature source Oral, resp. rate (!) 24, height 5\' 7"  (1.702 m), weight 90.7 kg, SpO2 96 %.        Intake/Output Summary (Last 24 hours) at 09/30/2022 1424 Last data filed at 09/30/2022 2130 Gross per 24 hour   Intake 3400 ml  Output --  Net 3400 ml   Filed Weights   09/30/22 0114 09/30/22 0925  Weight: 95.4 kg 90.7 kg    Examination: General: Well-appearing adult male lying in bed in no acute distress HEENT: Right sided facial swelling extending from drawl to posterior ear Neuro: Alert and oriented x 3, nonfocal CV: s1s2 regular rate and rhythm, no murmur, rubs, or gallops,  PULM: Clear to auscultation bilaterally no increased work of breathing no added breath sounds, on room air GI: soft, bowel sounds active in all 4 quadrants, non-tender, non-distended Extremities: warm/dry, no edema  Skin: no rashes or lesions  Resolved Hospital Problem list     Assessment & Plan:  Sepsis secondary to suspected odontogenic infection -Patient presented with fever, chills, tachycardia, tachypnea, hypotension, and leukocytosis with history of recent dental surgery P: No acute indication to transfer ICU at this time Continue broad-spectrum antibiotics including vancomycin, Flagyl, and cefepime, can likely de-escalate once medically stable Follow cultures Follow CT scan maxillofacial to assess 24 abscess if present made to transfer to tertiary center for oral surgery consult Continue as needed opioids for pain control Cooling blanket As needed antipyretics As needed antiemetics Magic mouthwash as needed  Mild AKI - improving -Creatinine on admission 1.72, GFR 52, renal function normal February 2024 P: Follow renal function Monitor urine output Trend Bmet Avoid nephrotoxins Ensure adequate renal perfusion  IV hydration  History of psoriasis with psoriatic arthritis -Managed with Remicade at baseline, last infusion 5 weeks prior to admission P: Outpatient follow-up Broad-spectrum antibiotics as above given immunosuppressive state   No acute indications  transfer ICU at this time, PCCM will be available as needed  Best Practice (right click and "Reselect all SmartList Selections" daily)   Per primary Labs   CBC: Recent Labs  Lab 09/30/22 0157 09/30/22 1003  WBC 20.1*  --   NEUTROABS 17.6*  --   HGB 11.7* 10.6*  HCT 34.7* 31.0*  MCV 71.5*  --   PLT 329  --     Basic Metabolic Panel: Recent Labs  Lab 09/30/22 0157 09/30/22 1003  NA 135 136  K 2.8* 3.5  CL 96* 100  CO2 24 26  GLUCOSE 143* 111*  BUN 20 12  CREATININE 1.72* 1.42*  CALCIUM 9.2 8.8*   GFR: Estimated Creatinine Clearance: 77.2 mL/min (A) (by C-G formula based on SCr of 1.42 mg/dL (H)). Recent Labs  Lab 09/30/22 0157  WBC 20.1*  LATICACIDVEN 1.8    Liver Function Tests: Recent Labs  Lab 09/30/22 0157  AST 32  ALT 33  ALKPHOS 64  BILITOT 0.6  PROT 7.6  ALBUMIN 3.8   No results for input(s): "LIPASE", "AMYLASE" in the last 168 hours. No results for input(s): "AMMONIA" in the last 168 hours.  ABG No results found for: "PHART", "PCO2ART", "PO2ART", "HCO3", "TCO2", "ACIDBASEDEF", "O2SAT"   Coagulation Profile: Recent Labs  Lab 09/30/22 0157  INR 1.2    Cardiac Enzymes: Recent Labs  Lab 09/30/22 1003  CKTOTAL 236    HbA1C: Hgb A1c MFr Bld  Date/Time Value Ref Range Status  05/10/2022 09:45 AM 6.1 4.6 - 6.5 % Final    Comment:    Glycemic Control Guidelines for People with Diabetes:Non Diabetic:  <6%Goal of Therapy: <7%Additional Action Suggested:  >8%     CBG: No results for input(s): "GLUCAP" in the last 168 hours.  Review of Systems:   Please see the history of present illness. All other systems reviewed and are negative   Past Medical History:  He,  has a past medical history of Anemia, Arthralgia, Seasonal allergies, and Tinea pedis.   Surgical History:  History reviewed. No pertinent surgical history.   Social History:   reports that he quit smoking about 12 years ago. His smoking use included cigars. He has never used smokeless tobacco. He reports current alcohol use. He reports that he does not use drugs.   Family History:  His family history  includes Hyperlipidemia in his father; Sleep apnea in his father; Thyroid disease in his father.   Allergies Allergies  Allergen Reactions   Amoxicillin Other (See Comments)    Mouth sores/thrush      Home Medications  Prior to Admission medications   Medication Sig Start Date End Date Taking? Authorizing Provider  acetaminophen (TYLENOL) 500 MG tablet Take 500 mg by mouth 2 (two) times daily as needed for moderate pain.   Yes [provider]  cetirizine (ZYRTEC) 10 MG tablet Take 10 mg by mouth daily as needed for allergies.   Yes [provider]  clobetasol (TEMOVATE) 0.05 % GEL Apply 1 Application topically as needed.   Yes [provider]  dextromethorphan-guaiFENesin (MUCINEX DM) 30-600 MG 12hr tablet Take 1 tablet by mouth daily as needed for cough.   Yes [provider]  flurbiprofen (ANSAID) 100 MG tablet Take 100 mg by mouth 2 (two) times daily. 09/25/22  Yes [provider]  ibuprofen (ADVIL) 800 MG tablet Take 800 mg by mouth 2 (two) times daily as needed for moderate pain.   Yes [provider]  inFLIXimab (REMICADE) 100  MG injection Inject into the skin every 6 (six) weeks.   Yes [provider]  acetaminophen-codeine (TYLENOL #3) 300-30 MG tablet Take 1 tablet by mouth every 6 (six) hours as needed. Patient not taking: Reported on 09/30/2022 09/14/22   [provider]  ibuprofen (ADVIL) 100 MG/5ML suspension Take 20 mLs (400 mg total) by mouth every 6 (six) hours as needed. Patient not taking: Reported on 09/30/2022 07/29/22   Derwood Kaplan, MD  magic mouthwash (lidocaine, diphenhydrAMINE, alum & mag hydroxide) suspension Swish and spit 5 mLs 4 (four) times daily as needed for mouth pain. Patient not taking: Reported on 09/30/2022 07/29/22   Derwood Kaplan, MD     Critical care time: NA  Bethel Gaglio D. Harris, NP-C [facility] Personal contact information can be found on Amion  If no  contact or response made please call [phone] 09/30/2022, 3:13 PM

## 2022-09-30 NOTE — Progress Notes (Signed)
CT scan of the head and neck noted concern for possible phlegmon/abscess.  Called [facility] who did not have oral surgery available at their facilities.  [facility] did have oral surgery available at their facility. Discussed case with Dr. Marcelle Smiling of oral surgery at [facility] regards to possible need of transfer for their consultative services.  Images were shared through power share and facesheet faxed.   Patient with persistent fevers elevated up to 105 F despite being on antibiotics and Tylenol.  He is currently on a cooling blanket with temperature of 103.4 F.  PCCM has been consulted in regards to the patient and are following.  It was recommended patient be given steroids which due to nausea and vomiting Decadron 10 mg IV was given x 1 dose.  Future orders placed for prednisone 60 mg daily ordered to start in AM.  Oral surgery had recommended hospitalist admission with him consulting due to possible concern for other infection.  There had been note of possible pneumonia on the CT of the neck, but likely thought 2/2 aspiration due to nausea and vomiting as he denied any complaints of cough.  The hospitalist service declined admission stating their facility was full at this time.  Dr. Quintin Alto of oral surgery had recommended transfer to their facility as a day trip on 4/29 and to come back to our facility after the procedure as they have no availability in the OR tomorrow.  However, was notified  by the transfer center they do not arrange day trips and transport would need to be arranged by our facility.  So if patient is stable and can be transferred to their facility on Monday morning  he can do the surgery and patient should be kept n.p.o. after midnight. Dr. Ludwig Clarks did state that if patient is able to be discharged he could be seen in the [facility] first floor oral maxillofacial surgery on Monday where they could have him seen and to continue him on steroids,  ciprofloxacin, and metronidazole.  If patient were to worsen recommended to call the transfer center again at [phone], but note patient has been taken off the transfer list.  Dr. Ludwig Clarks stated that he would be on-call tomorrow if needed as well and to call the transfer center.  Otherwise oral surgery is available at our facility or 4/30.

## 2022-09-30 NOTE — ED Notes (Signed)
Pt ambulatory to restroom

## 2022-09-30 NOTE — ED Provider Notes (Signed)
[place] EM[facility]GENC[facility] DEPARTMENT AT [facility] Provider Note   CSN: [id number] Arrival date & time: 09/30/22  0051     History  Chief Complaint  Patient presents with   Emesis   Fever    Darren Ramirez is a 37 y.o. male.  Patient is a 37 year old male with past medical history of psoriasis receiving Remicade injections.  Patient presenting today with complaints of fever and chills.  This has been occurring intermittently over the past 5 days.  Ago, he had his wisdom teeth on the right and has been experiencing the symptoms since he describes nausea, vomiting, and decreased intake.  He denies abdominal, cough.  This evening he became more chilled and wife noticed some shaking movements and presents for evaluation of this.  The history is provided by the patient.       Home Medications Prior to Admission medications   Medication Sig Start Date End Date Taking? Authorizing Provider  Cetirizine HCl (ZYRTEC ALLERGY PO) Take 1 tablet by mouth as needed.    [provider]  clobetasol (TEMOVATE) 0.05 % GEL 1 application to affected area Externally Twice a day for 30 days    [provider]  dextromethorphan-guaiFENesin (MUCINEX DM) 30-600 MG 12hr tablet Take 1 tablet by mouth 2 (two) times daily as needed for cough.    [provider]  ibuprofen (ADVIL) 100 MG/5ML suspension Take 20 mLs (400 mg total) by mouth every 6 (six) hours as needed. 07/29/22   Derwood Kaplan, MD  inFLIXimab (REMICADE) 100 MG injection 100 mg.    [provider]  magic mouthwash (lidocaine, diphenhydrAMINE, alum & mag hydroxide) suspension Swish and spit 5 mLs 4 (four) times daily as needed for mouth pain. 07/29/22   Derwood Kaplan, MD      Allergies    Patient has no known allergies.    Review of Systems   Review of Systems  All other systems reviewed and are negative.   Physical Exam Updated Vital Signs BP 115/74   Pulse (!) 138   Temp (!) 101.1 F  (38.4 C) (Oral)   Resp 18   Wt 95.4 kg   SpO2 97%   BMI 33.95 kg/m  Physical Exam Vitals and nursing note reviewed.  Constitutional:      General: He is not in acute distress.    Appearance: He is well-developed. He is not diaphoretic.  HENT:     Head: Normocephalic and atraumatic.     Mouth/Throat:     Mouth: Mucous membranes are dry.     Pharynx: No oropharyngeal exudate or posterior oropharyngeal erythema.     Comments: There is some tenderness noted to the right jaw, but no significant swelling. Cardiovascular:     Rate and Rhythm: Regular rhythm. Tachycardia present.     Heart sounds: No murmur heard.    No friction rub.  Pulmonary:     Effort: Pulmonary effort is normal. No respiratory distress.     Breath sounds: Normal breath sounds. No wheezing or rales.  Abdominal:     General: Bowel sounds are normal. There is no distension.     Palpations: Abdomen is soft.     Tenderness: There is no abdominal tenderness.  Musculoskeletal:        General: Normal range of motion.     Cervical back: Normal range of motion and neck supple.  Skin:    General: Skin is warm and dry.  Neurological:     Mental Status: He  is alert and oriented to person, place, and time.     Coordination: Coordination normal.     ED Results / Procedures / Treatments   Labs (all labs ordered are listed, but only abnormal results are displayed) Labs Reviewed  RESP PANEL BY RT-PCR (RSV, FLU A&B, COVID)  [id number]  CULTURE, BLOOD (ROUTINE X 2)  CULTURE, BLOOD (ROUTINE X 2)  LACTIC ACID, PLASMA  LACTIC ACID, PLASMA  COMPREHENSIVE METABOLIC PANEL  CBC WITH DIFFERENTIAL/PLATELET  PROTIME-INR  APTT  URINALYSIS, ROUTINE W REFLEX MICROSCOPIC  URINALYSIS, W/ REFLEX TO CULTURE (INFECTION SUSPECTED)    EKG EKG Interpretation  Date/Time:  Saturday September 30 2022 01:10:00 EDT Ventricular Rate:  140 PR Interval:  122 QRS Duration: 114 QT Interval:  302 QTC Calculation: 461 R Axis:   128 Text  Interpretation: Sinus tachycardia Incomplete right bundle branch block Consider left ventricular hypertrophy Confirmed by Geoffery Lyons ([id number]) on 09/30/2022 1:17:06 AM  Radiology No results found.  Procedures Procedures    Medications Ordered in ED Medications  lactated ringers infusion (has no administration in time range)  ceFEPIme (MAXIPIME) 2 g in sodium chloride 0.9 % 100 mL IVPB (has no administration in time range)  metroNIDAZOLE (FLAGYL) IVPB 500 mg (has no administration in time range)  vancomycin (VANCOCIN) IVPB 1000 mg/200 mL premix (has no administration in time range)  lactated ringers bolus 1,000 mL (has no administration in time range)    And  lactated ringers bolus 1,000 mL (has no administration in time range)    And  lactated ringers bolus 1,000 mL (has no administration in time range)    ED Course/ Medical Decision Making/ A&P  Patient is a 37 year old male with history of psoriasis/psoriatic arthritis who receives Remicade injections.  Patient presenting today with complaints of fever, chills, and bodyaches worsening over the past several days.  He recently underwent dental work during which time he had his wisdom teeth removed on the right side.  Patient arrives here febrile, tachycardic, and borderline hypotensive, concerning for sepsis.  Workup initiated including blood cultures x 2, CBC, metabolic panel, lactate.  Studies remarkable for white blood cell count of 20,000, worsening renal function with creatinine of 1.7, but studies otherwise unremarkable.  Chest x-ray obtained showing no acute process.  Sepsis fluids and broad-spectrum antibiotics administered.  Patient has received cefepime, vancomycin, and Flagyl.  Following the fluid bolus, his blood pressure is are now improving into the 100s and heart rate is also improving and is in the mid 110 range.  I have spoken with the hospitalist, Dr. Arville Care Who agrees to admit.  CRITICAL CARE Performed by:  Geoffery Lyons Total critical care time: 45 minutes Critical care time was exclusive of separately billable procedures and treating other patients. Critical care was necessary to treat or prevent imminent or life-threatening deterioration. Critical care was time spent personally by me on the following activities: development of treatment plan with patient and/or surrogate as well as nursing, discussions with consultants, evaluation of patient's response to treatment, examination of patient, obtaining history from patient or surrogate, ordering and performing treatments and interventions, ordering and review of laboratory studies, ordering and review of radiographic studies, pulse oximetry and re-evaluation of patient's condition.   Final Clinical Impression(s) / ED Diagnoses Final diagnoses:  None    Rx / DC Orders ED Discharge Orders     None         Geoffery Lyons, MD 09/30/22 [id number]

## 2022-09-30 NOTE — Progress Notes (Addendum)
Pharmacy Antibiotic Note  Darren Ramirez is a 37 y.o. male admitted on 09/30/2022 with sepsis.  Pharmacy has been consulted for vancomycin dosing.  Presented with tachycardia and tachypnea. Lactic acid 1.6 and WBC elevated at 20.1. Currently with significant AKI treated with IVF. Patient receiving immunosuppressive therapy with Remidcade PTA.  Plan: Vancomycin 2000 mg loading dose given in ED Start vancomycin 1g every 24 hours for Darren Ramirez of 437 using Cr 1.72 and VD of 0.5 Will adjust maintenance dose pending renal function changes - levels as indicated Cefepime and metronidazole per MD Check MRSA PCR F/u cultures and clinical progress for duration and de-escalation  Height: 5\' 7"  (170.2 cm) Weight: 90.7 kg (199 lb 14.4 oz) IBW/kg (Calculated) : 66.1  Temp (24hrs), Avg:100.3 F (37.9 C), Min:99 F (37.2 C), Max:102.6 F (39.2 C)  Recent Labs  Lab 09/30/22 0157  WBC 20.1*  CREATININE 1.72*  LATICACIDVEN 1.8    Estimated Creatinine Clearance: 63.7 mL/min (A) (by C-G formula based on SCr of 1.72 mg/dL (H)).    No Known Allergies  Antimicrobials this admission: Vancomycin 4/27 >>   Cefepime 4/27 >>   Flagyl 4/27 >>    Microbiology results: 4/27 BCx: IP  4/27: MRSA PCR: IP  Thank you for allowing pharmacy to be a part of this patient's care.  Marygrace Drought 09/30/2022 10:22 AM

## 2022-09-30 NOTE — ED Triage Notes (Signed)
Pt in with fever, emesis and "shaking" for a few days. Pt states he had oral surgery on 4/11, and the surgeon hit a nerve to R jaw and it has been causing him a lot of pain. Reports multiple episodes of diarrhea and vomiting, poor appetite. HR 140's temp 101.1

## 2022-09-30 NOTE — Significant Event (Signed)
Approximately 1253-- This RN messaged MD Katrinka Blazing regarding pt. Upon entering room, pt observed to be having rigors. VS obtained, HR around 120bpm. BP and stable. Temp is up to 100.7. PRN Tylenol and ordered Flagyl given. Rapid Response called. Pt family at bedside.   Wylene Men, Rapid RN, arrived at bedside. MD at bedside to assess pt---see new orders. Pt packed with ice, air made cool in room, blankets removed to aid in temperature management. Wylene Men to accompany patient and transporter for STAT CT of maxillofacial and neck. RN to continue to monitor pt.

## 2022-09-30 NOTE — Progress Notes (Signed)
   09/30/22 1340  Assess: MEWS Score  Temp (!) 104.3 F (40.2 C)  BP 128/65  MAP (mmHg) 86  Pulse Rate (!) 120  ECG Heart Rate (!) 120  Resp (!) 23  Level of Consciousness Alert  SpO2 98 %  O2 Device Room Air  Assess: MEWS Score  MEWS Temp 2  MEWS Systolic 0  MEWS Pulse 2  MEWS RR 1  MEWS LOC 0  MEWS Score 5  MEWS Score Color Red  Assess: if the MEWS score is Yellow or Red  Were vital signs taken at a resting state? No  Focused Assessment Change from prior assessment (see assessment flowsheet)  Does the patient meet 2 or more of the SIRS criteria? Yes  Does the patient have a confirmed or suspected source of infection? Yes  Provider and Rapid Response Notified? Yes  MEWS guidelines implemented  Yes, red  Treat  MEWS Interventions Considered administering scheduled or prn medications/treatments as ordered  Take Vital Signs  Increase Vital Sign Frequency  Red: Q1hr x2, continue Q4hrs until patient remains green for 12hrs  Escalate  MEWS: Escalate Red: Discuss with charge nurse and notify provider. Consider notifying RRT. If remains red for 2 hours consider need for higher level of care  Provider Notification  Provider Name/Title Madelyn Flavors MD  Date Provider Notified 09/30/22  Time Provider Notified 1254  Method of Notification Page  Notification Reason Other (Comment) (Red MEWS, pt having rigors, vomiting, tachycardic, febrile)  Provider response En route;See new orders (MD assess pt at bedside)  Date of Provider Response 09/30/22  Time of Provider Response 1258  Notify: Rapid Response  Name of Rapid Response RN Notified Wylene Men RN  Date Rapid Response Notified 09/30/22  Time Rapid Response Notified 1250  Assess: SIRS CRITERIA  SIRS Temperature  1  SIRS Pulse 1  SIRS Respirations  1  SIRS WBC 1  SIRS Score Sum  4

## 2022-09-30 NOTE — Plan of Care (Addendum)
CT scan of the head and neck noted concern for possible phlegmon/abscess.  Called [facility] who did not have oral surgery available at their facilities.  [facility] did have oral surgery available at their facility. Discussed case with Dr. Marcelle Smiling of oral surgery at [facility] regards to possible need of transfer for their consultative services.  Images were shared through power share and facesheet faxed.   Patient with persistent fevers elevated up to 105 F despite being on antibiotics and Tylenol.  He is currently on a cooling blanket.  It was recommended patient be given steroids which due to nausea and vomiting Decadron 10 mg IV was given x 1 dose.  Future orders placed for prednisone 60 mg daily ordered to start in AM.  Oral surgery had recommended hospitalist admission with him consulting due to possible concern for other infection.  There had been note of possible pneumonia on the CT of the neck, but likely thought 2/2 aspiration due to nausea and vomiting as he denied any complaints of cough.  The hospitalist service declined admission stating their facility was full at this time.  Dr. Quintin Alto of oral surgery had recommended transfer to their facility as a day trip on 4/29 and to come back to our facility after the procedure as they have no availability in the OR tomorrow.  However, was notified  by the transfer center they do not arrange day trips and transport would need to be arranged by our facility.  So if patient is stable and can be transferred to their facility on Monday morning  he can do the surgery and patient should be kept n.p.o. after midnight. Dr. Ludwig Clarks did state that if patient is able to be discharged he could be seen in the [facility]istry [facility] first floor oral maxillofacial surgery on Monday where they could have him seen and to continue him on steroids, ciprofloxacin, and metronidazole.  If patient were to worsen recommended to call the transfer center again  at [phone], but note patient has been taken off the transfer list.  Dr. Ludwig Clarks stated that he would be on-call tomorrow if needed as well and to call the transfer center.

## 2022-09-30 NOTE — Progress Notes (Signed)
Plan of Care Note for accepted transfer   Patient: Darren Ramirez MRN: [id number]   DOA: 09/30/2022  Facility requesting transfer: [facility] Requesting Provider: Juliene Pina, MD Reason for transfer: Sepsis due to undetermined organism/etiology. Facility course: Darren Ramirez is a 37 y.o. male.   Patient is a 37 year old male with past medical history of dyslipidemia, seizure disorder and psoriasis receiving Remicade injections.  Patient presenting today with complaints of fever and chills.  This has been occurring intermittently over the past 5 days.  Ago, he had his wisdom teeth on the right and has been experiencing the symptoms since he describes nausea, vomiting, and decreased intake.  He denies abdominal, cough.  This evening he became more chilled and wife noticed some shaking movements and presents for evaluation of this.  Upon presentation to the ER heart rate was 117 with respiratory rate of 22 and BP 103/67 and later 99/56 and with hydration 107/51.  He was febrile with a temperature of 102.6.  Labs revealed hypokalemia of 2.8 and hypochloremia 9 6.  Blood glucose was 143 and creatinine 1.72 up from 1.12 on 07/29/2022.  CMP was otherwise unremarkable.  Lactic acid was 1.8 and CBC showed leukocytosis of 20.1 compared to 16 point 07/29/2022, with neutrophilia.  PTT was 47 and respiratory panel was negative.  2 blood cultures were drawn.  UA is pending.  Portable chest x-ray showed no acute cardiopulmonary disease EKG showed sinus tachycardia with a rate of 140 with incomplete right bundle branch block and left ventricle hypertrophy.  The patient was given IV vancomycin, cefepime and Flagyl, hydration with IV lactated Ringer with 3 L bolus and 1 g of p.o. Tylenol as well as 4 mg of IV Zofran and 2 p.o. Norco.  Plan of care: The patient is accepted for admission to Telemetry unit, at [facility]..   The patient will be under the care and responsibility of the ER physician until  arrival to [facility].  Author: Hannah Beat, MD 09/30/2022  Check [email or web address] for on-call coverage.  Nursing staff, Please call [facility] Admits & Consults System-Wide number on Amion as soon as patient's arrival, so appropriate admitting provider can evaluate the pt.

## 2022-09-30 NOTE — ED Notes (Signed)
Attempted to call RN to give report to 3E. No answer.

## 2022-09-30 NOTE — Progress Notes (Signed)
Primary RN called for assistance due to increase in patient temperature. On arrival patient awake, oriented and RN stated PR tylenol had been given and STAT CT scan ordered. Went with patient to CT on monitor. Patient did well during procedure. On arrival back to room patient temp still 103.2 orally. Maryruth Eve RN

## 2022-09-30 NOTE — Progress Notes (Signed)
Approximately 0930-- Pt arrived to room 3East12. Upon arrival, pt A&O x 4 and VSS. No S/S of distress at this time. Telemetry notified and connected. Pt's family at bedside.

## 2022-09-30 NOTE — Sepsis Progress Note (Signed)
Elink monitoring for the code sepsis protocol.  

## 2022-09-30 NOTE — H&P (Addendum)
History and Physical    PatientGerren Ramirez ZOX:[id number] DOB: [birth year] DOA: 09/30/2022 DOS: the patient was seen and examined on 09/30/2022 PCP: Georgina Quint, MD  Patient coming from: Transfer from [facility]  Chief Complaint:  Chief Complaint  Patient presents with   Emesis   Fever   HPI: Darren Ramirez is a 37 y.o. male with medical history significant of psoriasis on Remicade and anemia who presents with complaints of fever and shaking chills.  Patient had his right upper and lower wisdom tooth removed on 4/11 by Dr. Leonie Man, [name].  Following the procedure patient reported that he was having a lot of pain had been given Tylenol with codeine which had not been providing adequate relief.  He noted associated symptoms of increased swelling on the right side of his jaw with pain behind the right ear.  Due to his symptoms he had had poor appetite.  Approximately, 6 days ago he began having severe shaking chills with subjective fever.  He followed up in the oral surgeons office 5 days ago and was told that everything looked well and he was given an anti-inflammatory medicine.  He had not been prescribed any antibiotics.However, patient continued to worsen and reported associated symptoms of nausea, vomiting, and diarrhea during this time.  Patient reports his stools had turned dark brown, but he had taken some Pepto-Bismol.  Denies having any significant abdominal pain or dysuria with the symptoms.  He is unsure if his paternal grandmother possibly had colon cancer.  He  has never had a colonoscopy before.  He was brought to the emergency department at by his mother due to the severity of his symptoms and him not improving.  In the emergency department patient was noted to be febrile up to 102.6 F with heart rates 89-1 38, respirations 18-32, blood pressures as low as 85/68 to 115/74, and O2 saturation currently maintained on room air.  In the emergency department patient was  noted to have WBC 20.1, hemoglobin 11.7 with low MCV/MCH, potassium 2.8 chloride 96 BUN 20, creatinine 1.72 glucose 143, and LFTs within normal limits.  Influenza, COVID-19, and RSV screening were negative.  Urinalysis is noted moderate hemoglobin, negative leukocytes, negative nitrite, no bacteria, and 0-5 RBCs.  Blood cultures have been obtained.  Patient received initial fluid bolus, acetaminophen 1000 mg p.o., antiemetics, hydrocodone for pain, vancomycin, metronidazole, and cefepime.     Review of Systems: As mentioned in the history of present illness. All other systems reviewed and are negative. Past Medical History:  Diagnosis Date   Anemia    Arthralgia    Seasonal allergies    Tinea pedis    History reviewed. No pertinent surgical history. Social History:  reports that he quit smoking about 12 years ago. His smoking use included cigars. He has never used smokeless tobacco. He reports current alcohol use. He reports that he does not use drugs.  No Known Allergies  Family History  Problem Relation Age of Onset   Hyperlipidemia Father    Sleep apnea Father    Thyroid disease Father     Prior to Admission medications   Medication Sig Start Date End Date Taking? Authorizing Provider  Cetirizine HCl (ZYRTEC ALLERGY PO) Take 1 tablet by mouth as needed.    [provider]  clobetasol (TEMOVATE) 0.05 % GEL 1 application to affected area Externally Twice a day for 30 days    [provider]  dextromethorphan-guaiFENesin (MUCINEX DM) 30-600 MG 12hr tablet  Take 1 tablet by mouth 2 (two) times daily as needed for cough.    [provider]  ibuprofen (ADVIL) 100 MG/5ML suspension Take 20 mLs (400 mg total) by mouth every 6 (six) hours as needed. 07/29/22   Derwood Kaplan, MD  inFLIXimab (REMICADE) 100 MG injection 100 mg.    [provider]  magic mouthwash (lidocaine, diphenhydrAMINE, alum & mag hydroxide) suspension Swish and spit 5 mLs 4 (four)  times daily as needed for mouth pain. 07/29/22   Derwood Kaplan, MD    Physical Exam: Vitals:   09/30/22 0730 09/30/22 0815 09/30/22 0836 09/30/22 0925  BP: (!) 102/55 (!) 103/59  (!) 96/58  Pulse: 85 88    Resp: (!) 26 (!) 28    Temp:   99 F (37.2 C) 99.2 F (37.3 C)  TempSrc:   Oral Oral  SpO2: 96% 96%    Weight:    90.7 kg  Height:    5\' 7"  (1.702 m)    Constitutional: Young male who appears sick but in no acute distress Eyes: PERRL, lids and conjunctivae normal ENMT: Mucous membranes are moist.  No redness or erythema seen on the inside of the mouth.  Decreased ability for patient to open mouth.  Facial swelling noted on the right. Neck: Erythema and swelling appreciated on the right side neck at the angle of the jaw and behind the ear.  No stridor appreciated. Respiratory: clear to auscultation bilaterally, no wheezing, no crackles. Normal respiratory effort. No accessory muscle use.  Cardiovascular: Regular rate and rhythm, no murmurs / rubs / gallops.   Abdomen: no tenderness, no masses palpated. Bowel sounds positive.  Musculoskeletal: no clubbing / cyanosis. No joint deformity upper and lower extremities. Good ROM, no contractures. Normal muscle tone.  Skin: Psoriatic plaques noted on anterior shin.  Erythema noted of the right angle of the jaw Neurologic: CN 2-12 grossly intact. Strength 5/5 in all 4.  Psychiatric: Normal judgment and insight. Alert and oriented x 3. Normal mood.   Data Reviewed:  EKG had revealed sinus tachycardia 140 bpm.  Reviewed labs, imaging, and pertinent records as noted above  Assessment and Plan: Sepsis secondary to suspected odontogenic infection Patient presents with complaints of fever, chills, mouth swelling, nausea, vomiting, and diarrhea after recently having wisdom tooth extracted on 4/11 by Dr. Monia Pouch.  He was noted to be febrile up to 102.6 F with tachycardia and tachypnea meeting SIRS.  Lactic acid was reassuring at 1.8.  On physical  exam patient with erythema and swelling of the right side of the face and jaw.  Suspect likely otogenic infection concerning for possible abscess.  Blood cultures have been obtained and he had been bolused IV fluids.  He was started empirically on antibiotics of vancomycin, metronidazole, and cefepime. -Admit to a telemetry bed -Follow-up blood cultures -Check CT scan of the maxillofacial with contrast.  Note we do not have oral surgery -Continue empiric antibiotics of vancomycin, metronidazole, and cefepime.  De-escalate when medically appropriate -Continue Magic mouthwash as needed -Hydrocodone as needed for pain -Antiemetics as needed  Acute kidney injury Creatinine elevated 1.72 with BUN 20.  Baseline creatinine previously noted be around 1.1.  Patient has been bolused IV fluids.  Suspect prerenal in nature given reports of nausea, vomiting, and diarrhea. -Monitor intake and output -Check CK -Avoid possible nephrotoxic agents -Continue IV fluids 150 mL/h -Continue to monitor kidney function  Abnormal urinalysis Acute.  Urinalysis is noted moderate hemoglobin, negative leukocytes, negative nitrite, no  bacteria, and 0-5 RBCs.  Findings given concern for the possibility of rhabdomyolysis especially in the setting of acute kidney injury. -Check CK  Microcytic hypochromic anemia Acute on chronic.  On admission hemoglobin 11.7 with MCV 71.5 and MCH 24.1.  Baseline hemoglobin has been within normal limits at 15 approximately 2 months ago.  Patient made note that his stools were dark in color but had also recently used Pepto-Bismol. -Type and screen for possible need of blood products -Check iron studies -Check stool guaiac -Serial monitoring of H&H, plan to transfuse blood products if needed if blood pressure -Would  likely benefit from referral to gastroenterology in the outpatient setting, but if concern for active bleeding Will consult importantly in the inpatient  setting  Hypokalemia Acute.  Initial potassium 2.8.  Patient had been given potassium chloride 40 meq p.o. -Give additional 20 meq p.o. -Continue to monitor and replace as needed  Psoriasis with psoriatic arthritis Patient is on Remicade for which he is considered immunocompromise, but last infusion was approximately 5 weeks ago. -Continue outpatient follow-up  Hypercholesterolemia Patient does not appear to be on any medication for treatment. -Recommend heart healthy diet  Obesity BMI 31.31 kg/m  GI prophylaxis, Protonix DVT prophylaxis: SCDs Advance Care Planning:   Code Status: Full Code    Consults: None  Family Communication: Mother updated at bedside  Severity of Illness: The appropriate patient status for this patient is INPATIENT. Inpatient status is judged to be reasonable and necessary in order to provide the required intensity of service to ensure the patient's safety. The patient's presenting symptoms, physical exam findings, and initial radiographic and laboratory data in the context of their chronic comorbidities is felt to place them at high risk for further clinical deterioration. Furthermore, it is not anticipated that the patient will be medically stable for discharge from the hospital within 2 midnights of admission.   * I certify that at the point of admission it is my clinical judgment that the patient will require inpatient hospital care spanning beyond 2 midnights from the point of admission due to high intensity of service, high risk for further deterioration and high frequency of surveillance required.*  Author: Clydie Braun, MD 09/30/2022 9:38 AM  For on call review [email or web address].

## 2022-10-01 DIAGNOSIS — N179 Acute kidney failure, unspecified: Secondary | ICD-10-CM | POA: Diagnosis not present

## 2022-10-01 DIAGNOSIS — A419 Sepsis, unspecified organism: Secondary | ICD-10-CM | POA: Diagnosis not present

## 2022-10-01 DIAGNOSIS — R8290 Unspecified abnormal findings in urine: Secondary | ICD-10-CM | POA: Diagnosis not present

## 2022-10-01 DIAGNOSIS — E876 Hypokalemia: Secondary | ICD-10-CM

## 2022-10-01 DIAGNOSIS — M272 Inflammatory conditions of jaws: Secondary | ICD-10-CM | POA: Diagnosis not present

## 2022-10-01 LAB — CULTURE, BLOOD (ROUTINE X 2)

## 2022-10-01 LAB — BLOOD CULTURE ID PANEL (REFLEXED) - BCID2

## 2022-10-01 LAB — CBC
HCT: 32.8 % — ABNORMAL LOW (ref 39.0–52.0)
Hemoglobin: 11.3 g/dL — ABNORMAL LOW (ref 13.0–17.0)
MCH: 24.5 pg — ABNORMAL LOW (ref 26.0–34.0)
MCHC: 34.5 g/dL (ref 30.0–36.0)
MCV: 71 fL — ABNORMAL LOW (ref 80.0–100.0)
Platelets: 290 10*3/uL (ref 150–400)
RBC: 4.62 MIL/uL (ref 4.22–5.81)
RDW: 14.5 % (ref 11.5–15.5)
WBC: 14.3 10*3/uL — ABNORMAL HIGH (ref 4.0–10.5)
nRBC: 0 % (ref 0.0–0.2)

## 2022-10-01 LAB — MAGNESIUM: Magnesium: 1.9 mg/dL (ref 1.7–2.4)

## 2022-10-01 LAB — BASIC METABOLIC PANEL
Anion gap: 8 (ref 5–15)
BUN: 12 mg/dL (ref 6–20)
CO2: 23 mmol/L (ref 22–32)
Calcium: 8.8 mg/dL — ABNORMAL LOW (ref 8.9–10.3)
Chloride: 102 mmol/L (ref 98–111)
Creatinine, Ser: 1.36 mg/dL — ABNORMAL HIGH (ref 0.61–1.24)
GFR, Estimated: >60 mL/min (ref >60)
Glucose, Bld: 149 mg/dL — ABNORMAL HIGH (ref 70–99)
Potassium: 3.4 mmol/L — ABNORMAL LOW (ref 3.5–5.1)
Sodium: 133 mmol/L — ABNORMAL LOW (ref 135–145)

## 2022-10-01 MED ORDER — POTASSIUM CHLORIDE CRYS ER 20 MEQ PO TBCR
40.0000 meq | EXTENDED_RELEASE_TABLET | Freq: Once | ORAL | Status: AC
  Administered 2022-10-01: 40 meq via ORAL
  Filled 2022-10-01: qty 2

## 2022-10-01 MED ORDER — FERROUS SULFATE 325 (65 FE) MG PO TABS
325.0000 mg | ORAL_TABLET | Freq: Every day | ORAL | Status: DC
  Administered 2022-10-02 – 2022-10-04 (×3): 325 mg via ORAL
  Filled 2022-10-01 (×3): qty 1

## 2022-10-01 MED ORDER — VANCOMYCIN HCL 750 MG/150ML IV SOLN
750.0000 mg | Freq: Two times a day (BID) | INTRAVENOUS | Status: DC
  Administered 2022-10-01 – 2022-10-02 (×2): 750 mg via INTRAVENOUS
  Filled 2022-10-01 (×2): qty 150

## 2022-10-01 NOTE — Progress Notes (Signed)
PHARMACY - PHYSICIAN COMMUNICATION CRITICAL VALUE ALERT - BLOOD CULTURE IDENTIFICATION (BCID)  Darren Ramirez is an 37 y.o. male who presented to [facility] on 09/30/2022 with a chief complaint of sepsis.   Assessment:  1/4 strep species likely from oral source   Name of physician (or Provider) Contacted: Arnetha Courser, MD   Current antibiotics: Cefepime, vancomycin, flagyl   Changes to prescribed antibiotics recommended:  Continue current regimen for now, potential de-escalation pending BCX finalization.   Results for orders placed or performed during the hospital encounter of 09/30/22  Blood Culture ID Panel (Reflexed) (Collected: 09/30/2022  1:57 AM)  Result Value Ref Range   Enterococcus faecalis NOT DETECTED NOT DETECTED   Enterococcus Faecium NOT DETECTED NOT DETECTED   Listeria monocytogenes NOT DETECTED NOT DETECTED   Staphylococcus species NOT DETECTED NOT DETECTED   Staphylococcus aureus (BCID) NOT DETECTED NOT DETECTED   Staphylococcus epidermidis NOT DETECTED NOT DETECTED   Staphylococcus lugdunensis NOT DETECTED NOT DETECTED   Streptococcus species DETECTED (A) NOT DETECTED   Streptococcus agalactiae NOT DETECTED NOT DETECTED   Streptococcus pneumoniae NOT DETECTED NOT DETECTED   Streptococcus pyogenes NOT DETECTED NOT DETECTED   A.calcoaceticus-baumannii NOT DETECTED NOT DETECTED   Bacteroides fragilis NOT DETECTED NOT DETECTED   Enterobacterales NOT DETECTED NOT DETECTED   Enterobacter cloacae complex NOT DETECTED NOT DETECTED   Escherichia coli NOT DETECTED NOT DETECTED   Klebsiella aerogenes NOT DETECTED NOT DETECTED   Klebsiella oxytoca NOT DETECTED NOT DETECTED   Klebsiella pneumoniae NOT DETECTED NOT DETECTED   Proteus species NOT DETECTED NOT DETECTED   Salmonella species NOT DETECTED NOT DETECTED   Serratia marcescens NOT DETECTED NOT DETECTED   Haemophilus influenzae NOT DETECTED NOT DETECTED   Neisseria meningitidis NOT DETECTED NOT DETECTED    Pseudomonas aeruginosa NOT DETECTED NOT DETECTED   Stenotrophomonas maltophilia NOT DETECTED NOT DETECTED   Candida albicans NOT DETECTED NOT DETECTED   Candida auris NOT DETECTED NOT DETECTED   Candida glabrata NOT DETECTED NOT DETECTED   Candida krusei NOT DETECTED NOT DETECTED   Candida parapsilosis NOT DETECTED NOT DETECTED   Candida tropicalis NOT DETECTED NOT DETECTED   Cryptococcus neoformans/gattii NOT DETECTED NOT DETECTED    [name], PharmD, BCCCP  10/01/2022  9:47 AM

## 2022-10-01 NOTE — Assessment & Plan Note (Signed)
Patient is on Remicade for which he is considered immunocompromise, but last infusion was approximately 5 weeks ago. -Continue outpatient follow-up

## 2022-10-01 NOTE — Assessment & Plan Note (Signed)
Improving but still mildly low potassium at 3.4. Check magnesium Replace potassium and monitor

## 2022-10-01 NOTE — Progress Notes (Signed)
Progress Note   PatientBrayam Boeke Ramirez UEA:[id number] DOB: 1986-05-22 DOA: 09/30/2022     1 DOS: the patient was seen and examined on 10/01/2022   Brief hospital course: Taken from H&P.  Darren Ramirez is a 37 y.o. male with medical history significant of psoriasis on Remicade and anemia who presents with complaints of fever and shaking chills.  Patient had his right upper and lower wisdom tooth removed on 4/11 by Dr. Leonie Man, [name].  Following the procedure patient reported that he was having a lot of pain had been given Tylenol with codeine which had not been providing adequate relief.  He noted associated symptoms of increased swelling on the right side of his jaw with pain behind the right ear.  Due to his symptoms he had had poor appetite.  Approximately, 6 days ago he began having severe shaking chills with subjective fever.  He followed up in the oral surgeons office 5 days ago and was told that everything looked well and he was given an anti-inflammatory medicine.  He had not been prescribed any antibiotics.However, patient continued to worsen and reported associated symptoms of nausea, vomiting, and diarrhea during this time.   Patient was found to have significantly elevated temperature with fever up to 104, not responding much to Tylenol also cooling blanket was used..  Labs pertinent for WBC 20.1, hemoglobin 11.7, potassium 2.8, BUN 20, creatinine 1.72.  Influenza, COVID-19, and RSV screening were negative.  Urinalysis is noted moderate hemoglobin, negative leukocytes, negative nitrite, no bacteria, and 0-5 RBCs.  Blood cultures have been obtained.  Patient received initial fluid bolus, acetaminophen 1000 mg p.o., antiemetics, hydrocodone for pain, vancomycin, metronidazole, and cefepime.   CT scan of the head and neck noted concern for possible phlegmon/abscess along the right mandible.[name] who did not have oral surgery available at their  facilities.  [facility] did have oral surgery available at their facility. Discussed case with Dr. Marcelle Smiling of oral surgery at [facility] regards to possible need of transfer for their consultative services.  Dr. Ludwig Clarks did accepted the patient but advised to be admitted under hospitalist services who declined as there was no bed availability.  Dr. Ludwig Clarks can do surgery on 4/29 as a day trip, unfortunately transportation is an issue, if that can be resolved then patient can go on Monday for surgery and come back to our facility.  If became stable then he can follow-up with [facility] first floor oral maxillofacial surgery on Monday where they could have him seen and to continue him on steroids, ciprofloxacin, and metronidazole.  If patient were to worsen recommended to call the transfer center again at [phone], but note patient has been taken off the transfer list.   4/28: Afebrile this morning.  MRSA PCR negative, leukocytosis improving, at 14.3.  Also found to have positive fecal occult blood.  Hemoglobin seems stable.  Iron studies consistent with anemia of chronic disease with some iron deficiency.  BMP with improvement of creatinine to 1.36.  Preliminary blood cultures remain negative in 24-hour. Clinically seems improving and should be able to follow-up as outpatient with oral surgeon at [facility].  We will continue with IV antibiotics for another day.     Assessment and Plan: * Sepsis due to undetermined organism [facility]) Patient met sepsis criteria secondary to odontogenic infection s/p recent tooth extraction.  CT concerning for mandibular abscess.  Preliminary blood cultures with Streptococcus species.  Admitting provider tried transferring to [facility] as there was no oral surgeon available.  Transfer was declined.  Clinically seems improving -Continue with broad-spectrum antibiotics -If remains stable can be discharged on ciprofloxacin and Flagyl as advised by  [facility] oral surgeon and he can follow-up with them as outpatient  Odontogenic infection of jaw -Please see above  AKI (acute kidney injury) (HCC) Most likely secondary to sepsis.  Creatinine improving. -Continue with IV fluid -Monitor renal function -Avoid nephrotoxins  Abnormal urinalysis UA with hemoglobinuria and negative RBCs.  CK was normal.  Microcytic hypochromic anemia Seems chronic.  FOBT positive.  Iron studies with anemia of chronic disease and some iron deficiency. -Start him on iron supplement -Outpatient GI follow-up  Psoriatic arthritis (HCC) Patient is on Remicade for which he is considered immunocompromise, but last infusion was approximately 5 weeks ago. -Continue outpatient follow-up  HYPERCHOLESTEROLEMIA Not on any medication at home. -Outpatient follow-up  Obesity (BMI 30-39.9) Estimated body mass index is 31.31 kg/m as calculated from the following:   Height as of this encounter: 5\' 7"  (1.702 m).   Weight as of this encounter: 90.7 kg.   -Counseling was provided  Hypokalemia Improving but still mildly low potassium at 3.4. Check magnesium Replace potassium and monitor   Subjective: Patient was feeling much improved when seen today.  Able to tolerate food.  He was taken off from cooling blanket around 10 PM last night.  Physical Exam: Vitals:   10/01/22 0002 10/01/22 0449 10/01/22 0813 10/01/22 1350  BP: 127/74 131/86 135/88 122/84  Pulse: 81 81 84 79  Resp: 19 18 17 20   Temp: 98.3 F (36.8 C) 97.7 F (36.5 C) 97.7 F (36.5 C) 98 F (36.7 C)  TempSrc: Oral Oral Oral Oral  SpO2: 97% 97% 97% 100%  Weight:  90.7 kg    Height:       General.  Overweight gentleman, in no acute distress.  Mild edema and erythema behind right ear Pulmonary.  Lungs clear bilaterally, normal respiratory effort. CV.  Regular rate and rhythm, no JVD, rub or murmur. Abdomen.  Soft, nontender, nondistended, BS positive. CNS.  Alert and oriented .  No focal  neurologic deficit. Extremities.  No edema, no cyanosis, pulses intact and symmetrical. Psychiatry.  Judgment and insight appears normal.   Data Reviewed: Prior data reviewed  Family Communication: Discussed with patient  Disposition: Status is: Inpatient Remains inpatient appropriate because: Severity of illness  Planned Discharge Destination: Home  Time spent: 50 minutes  This record has been created using Conservation officer, historic buildings. Errors have been sought and corrected,but may not always be located. Such creation errors do not reflect on the standard of care.   Author: Arnetha Courser, MD 10/01/2022 4:15 PM  For on call review [email or web address].

## 2022-10-01 NOTE — Assessment & Plan Note (Signed)
Estimated body mass index is 31.31 kg/m as calculated from the following:   Height as of this encounter: 5\' 7"  (1.702 m).   Weight as of this encounter: 90.7 kg.   -Counseling was provided

## 2022-10-01 NOTE — Assessment & Plan Note (Signed)
Most likely secondary to sepsis.  Creatinine improving. -Continue with IV fluid -Monitor renal function -Avoid nephrotoxins 

## 2022-10-01 NOTE — Plan of Care (Signed)

## 2022-10-01 NOTE — Assessment & Plan Note (Signed)
Not on any medication at home. -Outpatient follow-up

## 2022-10-01 NOTE — Progress Notes (Signed)
Pharmacy Antibiotic Note  Darren Ramirez is a 37 y.o. male admitted on 09/30/2022 with sepsis.  Pharmacy has been consulted for vancomycin dosing, also on cefepime + metronidazole per MD. Cr continues to improve with hydration. MRSA PCR negative, WBC remains elevated. Noted plans for possible transfer for oral surgery.  Plan: Adjust vancomycin to 750mg  IV q12h - est AUC 531  Height: 5\' 7"  (170.2 cm) Weight: 90.7 kg (199 lb 14.4 oz) IBW/kg (Calculated) : 66.1  Temp (24hrs), Avg:99.9 F (37.7 C), Min:97.7 F (36.5 C), Max:104.3 F (40.2 C)  Recent Labs  Lab 09/30/22 0157 09/30/22 1003 10/01/22 0046  WBC 20.1*  --  14.3*  CREATININE 1.72* 1.42* 1.36*  LATICACIDVEN 1.8  --   --      Estimated Creatinine Clearance: 80.6 mL/min (A) (by C-G formula based on SCr of 1.36 mg/dL (H)).    Allergies  Allergen Reactions   Amoxicillin Other (See Comments)    Mouth sores/thrush     Antimicrobials this admission: Vancomycin 4/27 >>   Cefepime 4/27 >>   Flagyl 4/27 >>    Microbiology results: 4/27 BCx: IP  4/27: MRSA PCR: neg  Thank you for allowing pharmacy to be a part of this patient's care.   Fredonia Highland, PharmD, BCPS, [facility] Clinical Pharmacist [phone] Please check AMION for all [facility] Pharmacy numbers 10/01/2022

## 2022-10-01 NOTE — Hospital Course (Addendum)
Taken from H&P.  Darren Ramirez is a 37 y.o. male with medical history significant of psoriasis on Remicade and anemia who presents with complaints of fever and shaking chills.  Patient had his right upper and lower wisdom tooth removed on 4/11 by Dr. Leonie Ramirez, [facility].  Following the procedure patient reported that he was having a lot of pain had been given Tylenol with codeine which had not been providing adequate relief.  He noted associated symptoms of increased swelling on the right side of his jaw with pain behind the right ear.  Due to his symptoms he had had poor appetite.  Approximately, 6 days ago he began having severe shaking chills with subjective fever.  He followed up in the oral surgeons office 5 days ago and was told that everything looked well and he was given an anti-inflammatory medicine.  He had not been prescribed any antibiotics.However, patient continued to worsen and reported associated symptoms of nausea, vomiting, and diarrhea during this time.   Patient was found to have significantly elevated temperature with fever up to 104, not responding much to Tylenol also cooling blanket was used..  Labs pertinent for WBC 20.1, hemoglobin 11.7, potassium 2.8, BUN 20, creatinine 1.72.  Influenza, COVID-19, and RSV screening were negative.  Urinalysis is noted moderate hemoglobin, negative leukocytes, negative nitrite, no bacteria, and 0-5 RBCs.  Blood cultures have been obtained.  Patient received initial fluid bolus, acetaminophen 1000 mg p.o., antiemetics, hydrocodone for pain, vancomycin, metronidazole, and cefepime.   CT scan of the head and neck noted concern for possible phlegmon/abscess along the right mandible.[facility] who did not have oral surgery available at their facilities.  [facility] did have oral surgery available at their facility. Discussed case with [facility] of oral surgery at [facility] regards to possible need of transfer for their consultative  services.  [facility] did accepted the patient but advised to be admitted under hospitalist services who declined as there was no bed availability.  [facility] can do surgery on 4/29 as a day trip, unfortunately transportation is an issue, if that can be resolved then patient can go on Monday for surgery and come back to our facility.  If became stable then he can follow-up with [facility] first floor oral maxillofacial surgery on Monday where they could have him seen and to continue him on steroids, ciprofloxacin, and metronidazole.  If patient were to worsen recommended to call the transfer Ramirez again at [phone], but note patient has been taken off the transfer list.   4/28: Afebrile this morning.  MRSA PCR negative, leukocytosis improving, at 14.3.  Also found to have positive fecal occult blood.  Hemoglobin seems stable.  Iron studies consistent with anemia of chronic disease with some iron deficiency.  BMP with improvement of creatinine to 1.36.  Preliminary blood cultures remain negative in 24-hour. Clinically seems improving and should be able to follow-up as outpatient with oral surgeon at [facility].  We will continue with IV antibiotics for another day.  4/29: Patient remained afebrile and hemodynamically stable.  Blood cultures now growing Streptococcus.  Clinically seems improving.  ID was also consulted and echocardiogram ordered.  We will have an oral surgeon [facility] on-call tomorrow at [facility], who can be consulted.  Repeat blood cultures ordered  4/30: Hemodynamically stable.  Repeat blood cultures remain negative in 24-hour.  Echocardiogram without any evidence of endocarditis.  TEE was requested.  Talked with [facility], apparently he does not do inpatient consults and will be happy to see the patient once discharge.  5/1: Patient remained hemodynamically stable.  TEE today was negative for any vegetation. We made the appointment with oral surgery and  patient is being discharged on Augmentin based on his blood culture results for a total of 4-week course.  He will follow-up with ID as outpatient.  Patient has stable creatinine but seems like above baseline or normal.  He was also having intermittently elevated blood pressure.  Pain can be contributory.  He was advised to check his blood pressure regularly at home while he is resting and take that log to his primary care provider to see if he need to be started on a medication.  He was also advised to keep monitoring his renal function and maintain his hydration.  We also discussed about positive FOBT and advised to follow-up with gastroenterology as an outpatient as he will need endoscopies to rule out any underlying lesion.  Case was cussed in detail with patient and his mother at bedside.  Patient will continue with his home medications and need to have a close follow-up with his providers for further recommendations.

## 2022-10-01 NOTE — Assessment & Plan Note (Signed)
Patient met sepsis criteria secondary to odontogenic infection s/p recent tooth extraction.  CT concerning for mandibular abscess.  Preliminary blood cultures with Streptococcus species. Admitting provider tried transferring to [facility] as there was no oral surgeon available.  Transfer was declined.  Clinically seems improving -Continue with broad-spectrum antibiotics -If remains stable can be discharged on ciprofloxacin and Flagyl as advised by [facility] oral surgeon and he can follow-up with them as outpatient

## 2022-10-01 NOTE — Assessment & Plan Note (Signed)
UA with hemoglobinuria and negative RBCs.  CK was normal.

## 2022-10-01 NOTE — Assessment & Plan Note (Signed)
Please see above

## 2022-10-01 NOTE — Assessment & Plan Note (Signed)
Seems chronic.  FOBT positive.  Iron studies with anemia of chronic disease and some iron deficiency. -Start him on iron supplement -Outpatient GI follow-up 

## 2022-10-02 ENCOUNTER — Inpatient Hospital Stay (HOSPITAL_COMMUNITY): Payer: BC Managed Care – PPO

## 2022-10-02 DIAGNOSIS — M272 Inflammatory conditions of jaws: Secondary | ICD-10-CM | POA: Diagnosis not present

## 2022-10-02 DIAGNOSIS — R7881 Bacteremia: Secondary | ICD-10-CM

## 2022-10-02 DIAGNOSIS — B955 Unspecified streptococcus as the cause of diseases classified elsewhere: Secondary | ICD-10-CM | POA: Diagnosis not present

## 2022-10-02 DIAGNOSIS — R8290 Unspecified abnormal findings in urine: Secondary | ICD-10-CM | POA: Diagnosis not present

## 2022-10-02 DIAGNOSIS — N179 Acute kidney failure, unspecified: Secondary | ICD-10-CM | POA: Diagnosis not present

## 2022-10-02 DIAGNOSIS — A419 Sepsis, unspecified organism: Secondary | ICD-10-CM | POA: Diagnosis not present

## 2022-10-02 DIAGNOSIS — L4050 Arthropathic psoriasis, unspecified: Secondary | ICD-10-CM | POA: Diagnosis not present

## 2022-10-02 LAB — BASIC METABOLIC PANEL
Anion gap: 9 (ref 5–15)
BUN: 19 mg/dL (ref 6–20)
CO2: 24 mmol/L (ref 22–32)
Calcium: 9.2 mg/dL (ref 8.9–10.3)
Chloride: 105 mmol/L (ref 98–111)
Creatinine, Ser: 1.26 mg/dL — ABNORMAL HIGH (ref 0.61–1.24)
GFR, Estimated: >60 mL/min (ref >60)
Glucose, Bld: 138 mg/dL — ABNORMAL HIGH (ref 70–99)
Potassium: 3.8 mmol/L (ref 3.5–5.1)
Sodium: 138 mmol/L (ref 135–145)

## 2022-10-02 LAB — CBC
HCT: 31.7 % — ABNORMAL LOW (ref 39.0–52.0)
Hemoglobin: 10.8 g/dL — ABNORMAL LOW (ref 13.0–17.0)
MCH: 24.5 pg — ABNORMAL LOW (ref 26.0–34.0)
MCHC: 34.1 g/dL (ref 30.0–36.0)
MCV: 71.9 fL — ABNORMAL LOW (ref 80.0–100.0)
Platelets: 350 10*3/uL (ref 150–400)
RBC: 4.41 MIL/uL (ref 4.22–5.81)
RDW: 14.6 % (ref 11.5–15.5)
WBC: 17.8 10*3/uL — ABNORMAL HIGH (ref 4.0–10.5)
nRBC: 0 % (ref 0.0–0.2)

## 2022-10-02 LAB — ECHOCARDIOGRAM COMPLETE
Area-P 1/2: 2.16 cm2
Calc EF: 53.9 %
Height: 67 in
S' Lateral: 3.7 cm
Single Plane A2C EF: 58.2 %
Single Plane A4C EF: 49.5 %
Weight: 3216 oz

## 2022-10-02 MED ORDER — SODIUM CHLORIDE 0.9 % IV SOLN
2.0000 g | INTRAVENOUS | Status: DC
  Administered 2022-10-02 – 2022-10-03 (×2): 2 g via INTRAVENOUS
  Filled 2022-10-02 (×3): qty 20

## 2022-10-02 MED ORDER — PANTOPRAZOLE SODIUM 40 MG PO TBEC
40.0000 mg | DELAYED_RELEASE_TABLET | Freq: Two times a day (BID) | ORAL | Status: DC
  Administered 2022-10-02 – 2022-10-04 (×4): 40 mg via ORAL
  Filled 2022-10-02 (×4): qty 1

## 2022-10-02 NOTE — Assessment & Plan Note (Addendum)
Patient met sepsis criteria secondary to odontogenic infection s/p recent tooth extraction.  CT concerning for mandibular abscess.  Preliminary blood cultures with Streptococcus intermedius Admitting provider tried transferring to [facility] as there was no oral surgeon available.  Transfer was declined.  Clinically seems improving -Antibiotics are being switched with ceftriaxone and Flagyl. -ID consult -Repeat blood culture-remain negative so far -Echocardiogram with no sign of endocarditis-TEE requested -Dr. Chales Salmon was consulted, apparently he does not see inpatient but will be happy to see him once discharged from hospital

## 2022-10-02 NOTE — Assessment & Plan Note (Signed)
Resolved.  Magnesium was normal.

## 2022-10-02 NOTE — Assessment & Plan Note (Signed)
Please see above

## 2022-10-02 NOTE — Progress Notes (Signed)
Progress Note   PatientBayan Ramirez Ramirez Ramirez:[id number] DOB: 26-Mar-1986 DOA: 09/30/2022     2 DOS: the patient was seen and examined on 10/02/2022   Brief hospital course: Taken from H&P.  Darren Ramirez is a 37 y.o. male with medical history significant of psoriasis on Remicade and anemia who presents with complaints of fever and shaking chills.  Patient had his right upper and lower wisdom tooth removed on 4/11 by Dr. Leonie Man, [name].  Following the procedure patient reported that he was having a lot of pain had been given Tylenol with codeine which had not been providing adequate relief.  He noted associated symptoms of increased swelling on the right side of his jaw with pain behind the right ear.  Due to his symptoms he had had poor appetite.  Approximately, 6 days ago he began having severe shaking chills with subjective fever.  He followed up in the oral surgeons office 5 days ago and was told that everything looked well and he was given an anti-inflammatory medicine.  He had not been prescribed any antibiotics.However, patient continued to worsen and reported associated symptoms of nausea, vomiting, and diarrhea during this time.   Patient was found to have significantly elevated temperature with fever up to 104, not responding much to Tylenol also cooling blanket was used..  Labs pertinent for WBC 20.1, hemoglobin 11.7, potassium 2.8, BUN 20, creatinine 1.72.  Influenza, COVID-19, and RSV screening were negative.  Urinalysis is noted moderate hemoglobin, negative leukocytes, negative nitrite, no bacteria, and 0-5 RBCs.  Blood cultures have been obtained.  Patient received initial fluid bolus, acetaminophen 1000 mg p.o., antiemetics, hydrocodone for pain, vancomycin, metronidazole, and cefepime.   CT scan of the head and neck noted concern for possible phlegmon/abscess along the right mandible.[name] who did not have oral surgery available at their  facilities.  [facility] did have oral surgery available at their facility. Discussed case with Dr. Marcelle Smiling of oral surgery at [facility] regards to possible need of transfer for their consultative services.  Dr. Ludwig Clarks did accepted the patient but advised to be admitted under hospitalist services who declined as there was no bed availability.  Dr. Ludwig Clarks can do surgery on 4/29 as a day trip, unfortunately transportation is an issue, if that can be resolved then patient can go on Monday for surgery and come back to our facility.  If became stable then he can follow-up with [facility] first floor oral maxillofacial surgery on Monday where they could have him seen and to continue him on steroids, ciprofloxacin, and metronidazole.  If patient were to worsen recommended to call the transfer center again at [phone], but note patient has been taken off the transfer list.   4/28: Afebrile this morning.  MRSA PCR negative, leukocytosis improving, at 14.3.  Also found to have positive fecal occult blood.  Hemoglobin seems stable.  Iron studies consistent with anemia of chronic disease with some iron deficiency.  BMP with improvement of creatinine to 1.36.  Preliminary blood cultures remain negative in 24-hour. Clinically seems improving and should be able to follow-up as outpatient with oral surgeon at [facility].  We will continue with IV antibiotics for another day.  4/29: Patient remained afebrile and hemodynamically stable.  Blood cultures now growing Streptococcus.  Clinically seems improving.  ID was also consulted and echocardiogram ordered.  We will have an oral surgeon Dr. Chales Salmon on-call tomorrow at [facility],  who can be consulted.  Repeat blood cultures ordered   Assessment and Plan: * Sepsis due to undetermined organism [facility]) Patient met sepsis criteria secondary to odontogenic infection s/p recent tooth extraction.  CT concerning for mandibular abscess.  Preliminary blood cultures  with Streptococcus species. Admitting provider tried transferring to [facility] as there was no oral surgeon available.  Transfer was declined.  Clinically seems improving -Antibiotics are being switched with ceftriaxone and Flagyl -ID consult -Repeat blood culture -Echocardiogram ordered -We will have oral surgeon available tomorrow who can be consulted  Odontogenic infection of jaw -Please see above  AKI (acute kidney injury) (HCC) Most likely secondary to sepsis.  Creatinine improving. -Continue with IV fluid -Monitor renal function -Avoid nephrotoxins  Abnormal urinalysis UA with hemoglobinuria and negative RBCs.  CK was normal.  Microcytic hypochromic anemia Seems chronic.  FOBT positive.  Iron studies with anemia of chronic disease and some iron deficiency. -Start him on iron supplement -Outpatient GI follow-up  Psoriatic arthritis (HCC) Patient is on Remicade for which he is considered immunocompromise, but last infusion was approximately 5 weeks ago. -Continue outpatient follow-up  HYPERCHOLESTEROLEMIA Not on any medication at home. -Outpatient follow-up  Obesity (BMI 30-39.9) Estimated body mass index is 31.31 kg/m as calculated from the following:   Height as of this encounter: 5\' 7"  (1.702 m).   Weight as of this encounter: 90.7 kg.   -Counseling was provided  Hypokalemia Resolved.  Magnesium was normal.   Subjective: Patient continued to improve.  Right behind ear edema and erythema is almost resolved.  Physical Exam: Vitals:   10/01/22 2349 10/02/22 0307 10/02/22 0311 10/02/22 0835  BP: 125/63  (!) 154/80 (!) 135/91  Pulse:   64   Resp: 17  19   Temp: 98.4 F (36.9 C)  98 F (36.7 C) 98.4 F (36.9 C)  TempSrc: Oral  Oral Oral  SpO2:   100% 99%  Weight:  91.2 kg    Height:       General.  Overweight gentleman, in no acute distress. Pulmonary.  Lungs clear bilaterally, normal respiratory effort. CV.  Regular rate and rhythm, no JVD, rub  or murmur. Abdomen.  Soft, nontender, nondistended, BS positive. CNS.  Alert and oriented .  No focal neurologic deficit. Extremities.  No edema, no cyanosis, pulses intact and symmetrical. Psychiatry.  Judgment and insight appears normal.    Data Reviewed: Prior data reviewed  Family Communication: Discussed with patient and mother on phone.  Disposition: Status is: Inpatient Remains inpatient appropriate because: Severity of illness  Planned Discharge Destination: Home  Time spent: 50 minutes  This record has been created using Conservation officer, historic buildings. Errors have been sought and corrected,but may not always be located. Such creation errors do not reflect on the standard of care.   Author: Arnetha Courser, MD 10/02/2022 10:07 AM  For on call review [email or web address].

## 2022-10-02 NOTE — Consult Note (Signed)
[facility]    Date of Admission:  09/30/2022     Reason for Consult: Strep bacteremia     Referring Physician: Dr Nelson Chimes  Current antibiotics: Vancomycin Cefepime Metronidazole  ASSESSMENT:    37 y.o. male admitted with:  Streptococcus intermedius bacteremia: Secondary to an odontogenic source with recent dental surgery on 09/14/2022 and CT scan showing a developing phlegmon/abscess with a small area of cortical thinning in the right mandible.  Blood cultures positive from 09/30/2022 and repeat blood cultures obtained today.  Initial attempts at transferring patient to a tertiary center with oral surgery follow-up have been unsuccessful.  However, Dr. Chales Salmon will be on call tomorrow at [facility] and the plan is to consult at that time. Sepsis: Secondary to #1. Acute kidney injury: Patient per did with creatinine 1.7 with improvement this morning to 1.2. Psoriatic arthritis: Patient is currently on Remicade with his last infusion approximately 5 weeks ago. Penicillin allergy: Patient reports developing thrush when he last took amoxicillin, however, he has tolerated this previously on numerous occasions.  RECOMMENDATIONS:    Change antibiotics to ceftriaxone and metronidazole pending susceptibilities If susceptible to penicillin, would feel comfortable with Unasyn Repeat blood cultures obtained this morning Check TTE.  Likely would recommend TEE given his duration of symptoms for approximately 3 weeks Appreciate oral surgery follow-up who will be consulted tomorrow Following   Principal Problem:   Sepsis due to undetermined organism [facility]) Active Problems:   HYPERCHOLESTEROLEMIA   Psoriatic arthritis (HCC)   Odontogenic infection of jaw   Abnormal urinalysis   AKI (acute kidney injury) (HCC)   Microcytic hypochromic anemia   Obesity (BMI 30-39.9)   Hypokalemia   MEDICATIONS:    Scheduled Meds: . ferrous sulfate  325 mg Oral Q breakfast  .  pantoprazole  40 mg Oral BID  . predniSONE  60 mg Oral Q breakfast   Continuous Infusions: . cefTRIAXone (ROCEPHIN)  IV    . lactated ringers 10 mL/hr at 10/02/22 1036  . metronidazole 100 mL/hr at 10/02/22 0318   PRN Meds:.acetaminophen **OR** acetaminophen, albuterol, HYDROcodone-acetaminophen, ibuprofen, lidocaine (XYLOCAINE) 2 % 1.6667 mL, diphenhydrAMINE (BENADRYL) 12.5 MG/5ML 1.6667 mL, alum & mag hydroxide-simeth (MAALOX/MYLANTA) 200-200-20 MG/5ML 1.6667 mL, ondansetron **OR** ondansetron (ZOFRAN) IV, prochlorperazine  HPI:    [facility] is a 37 y.o. male with past medical history of psoriasis on Remicade, anemia, and recent wisdom tooth surgery 09/14/2022 who presented to hospital on 09/30/2022 with fevers and chills.  Patient had his wisdom tooth removed on the right with Dr. Clelia Schaumann 09/14/2022.  Following this procedure, he reported ongoing pain that was unrelieved with pain medication.  He also had a throbbing sensation and a postauricular swelling that was unrelieved.  He had follow-up last Monday with Dr. Monia Pouch who recommended ongoing pain medication and felt that his symptoms could be secondary to nerve irritation.  Unfortunately, the patient continued to worsen and developed fevers and chills.  He presented to the emergency department on 09/30/2022.  He was found to have concerns for sepsis with WBC 20.1.  He was also febrile with a Tmax of 104.3 F.  He was admitted to the hospital and blood cultures were obtained.  He was subsequently started on broad-spectrum antibiotics.  Imaging was also obtained with a CT scan showing phlegmon or developing abscess along the right mandible with a small area of cortical thinning.  There was asymmetric edema and fat stranding in the right lower face consistent with  cellulitis.  The hospitalist taking care of the patient attempted for him to be transferred to a tertiary center with oral surgery, however, there was no bed availability.  The  tentative plan was to discharge early today on oral antibiotics with [facility] follow-up as an outpatient.  However, his blood cultures have now turned positive in 4 out of 4 bottles for Streptococcus intermedius and we have been consulted for further antibiotic recommendations.   Past Medical History:  Diagnosis Date  . Anemia   . Arthralgia   . Seasonal allergies   . Tinea pedis     Social History   Tobacco Use  . Smoking status: Former    Types: Cigars    Quit date: 06/05/2010    Years since quitting: 12.3  . Smokeless tobacco: Never  Substance Use Topics  . Alcohol use: Yes    Comment: 1-2 times per month  . Drug use: No    Family History  Problem Relation Age of Onset  . Hyperlipidemia Father   . Sleep apnea Father   . Thyroid disease Father     Allergies  Allergen Reactions  . Amoxicillin Other (See Comments)    Mouth sores/thrush     Review of Systems  All other systems reviewed and are negative. Except as noted above.  OBJECTIVE:   Blood pressure (!) 135/91, pulse 64, temperature 98.4 F (36.9 C), temperature source Oral, resp. rate 19, height 5\' 7"  (1.702 m), weight 91.2 kg, SpO2 99 %. Body mass index is 31.48 kg/m.  Physical Exam Constitutional:      Appearance: Normal appearance.  HENT:     Head: Normocephalic and atraumatic.     Mouth/Throat:     Comments: There is mild swelling appreciated on the right side of his face.  No significant tenderness around his jaw or posterior auricular area Eyes:     Extraocular Movements: Extraocular movements intact.     Conjunctiva/sclera: Conjunctivae normal.  Cardiovascular:     Pulses: Normal pulses.  Pulmonary:     Effort: Pulmonary effort is normal. No respiratory distress.  Abdominal:     General: There is no distension.     Palpations: Abdomen is soft.  Musculoskeletal:        General: Normal range of motion.     Cervical back: Normal range of motion and neck supple.  Skin:    General: Skin is warm  and dry.     Findings: No rash.  Neurological:     General: No focal deficit present.     Mental Status: He is alert and oriented to person, place, and time.  Psychiatric:        Mood and Affect: Mood normal.        Behavior: Behavior normal.      Lab Results: Lab Results  Component Value Date   WBC 17.8 (H) 10/02/2022   HGB 10.8 (L) 10/02/2022   HCT 31.7 (L) 10/02/2022   MCV 71.9 (L) 10/02/2022   PLT 350 10/02/2022    Lab Results  Component Value Date   NA 138 10/02/2022   K 3.8 10/02/2022   CO2 24 10/02/2022   GLUCOSE 138 (H) 10/02/2022   BUN 19 10/02/2022   CREATININE 1.26 (H) 10/02/2022   CALCIUM 9.2 10/02/2022   GFRNONAA >60 10/02/2022    Lab Results  Component Value Date   ALT 33 09/30/2022   AST 32 09/30/2022   ALKPHOS 64 09/30/2022   BILITOT 0.6 09/30/2022    No results  found for: "CRP"     Component Value Date/Time   ESRSEDRATE 48 (H) 10/01/2009 1138    I have reviewed the micro and lab results in Epic.  Imaging: CT SOFT TISSUE NECK W CONTRAST  Result Date: 09/30/2022 CLINICAL DATA:  Soft tissue infection suspected EXAM: CT NECK WITH CONTRAST TECHNIQUE: Multidetector CT imaging of the neck was performed using the standard protocol following the bolus administration of intravenous contrast. RADIATION DOSE REDUCTION: This exam was performed according to the departmental dose-optimization program which includes automated exposure control, adjustment of the mA and/or kV according to patient size and/or use of iterative reconstruction technique. CONTRAST:  75mL OMNIPAQUE IOHEXOL 350 MG/ML SOLN COMPARISON:  No prior CT neck available, correlation is made with CT head and cervical spine 03/04/2021 FINDINGS: Pharynx and larynx: Normal. No mass or swelling. Salivary glands: No inflammation, mass, or stone. Thyroid: Normal. Lymph nodes: None enlarged or abnormal density. Vascular: Patent. Limited intracranial: Negative. Visualized orbits: Negative. Mastoids and  visualized paranasal sinuses: Minimal mucosal thickening in the ethmoid air cells. Otherwise clear paranasal sinuses. The mastoids are well aerated. Skeleton: No acute fracture or suspicious osseous lesion. Sequela of recent removal of the right maxillary and mandibular third molars. Small area of low-density along the lateral aspect of the right mandible, adjacent to the extraction socket and subjacent to the inferior aspect of the superficial part of the masseter, without significant peripheral enhancement (series 3, image 64 and series 6, image 48), which could represent phlegmon or developing abscess. This area measures approximately 15 x 5 x 15 mm (AP x TR x CC) (series 3, image 64 and series 6, image 48). This is adjacent to a small area of cortical thinning in the right mandible (series 4, image 63). No definite inflammatory changes adjacent to the right maxillary extraction socket. Upper chest: Scattered ground-glass and nodular opacities in the right greater than left lung (series 3, images 130 and 139). No pleural effusion. Other: Asymmetric edema and fat stranding in the right lower face (series 6, image 39 and series 3, image 60). IMPRESSION: 1. Small area of low-density along the lateral aspect of the right mandible, adjacent to the extraction socket and subjacent to the inferior aspect of the masseter, which could represent phlegmon or developing abscess. This is adjacent to a small area of cortical thinning in the right mandible. 2. Asymmetric edema and fat stranding in the right lower face, consistent with cellulitis. 3. Scattered ground-glass and nodular opacities in the right greater than left lung, which are incompletely evaluated but concerning for multifocal pneumonia. These results will be called to the ordering clinician or representative by the Radiologist Assistant, and communication documented in the PACS or [employer]. Electronically Signed   By: Wiliam Ke M.D.   On: 09/30/2022  14:46     Imaging independently reviewed in Epic.  Vedia Coffer for Infectious Disease [facility] [phone] pager 10/02/2022, 11:03 AM

## 2022-10-02 NOTE — TOC Progression Note (Signed)
Transition of Care [facility]) - Progression Note    Patient Details  Name: Darren Ramirez MRN: [id number] Date of Birth: [birth year]  Transition of [facility]) CM/SW Contact  Leone Haven, RN Phone Number: 10/02/2022, 4:01 PM  Clinical Narrative:    From home, presents with Sepsis,  Hypercholesterolemia,Psoriatic arthritis,Odontogenic infection of jaw,Abnormal urinalysis,AKI,Microcytic hypochromic anemia,Obesity,Hypokalemia. Conts on iv abx, TOC following.          Expected Discharge Plan and Services                                               Social Determinants of Health (SDOH) Interventions SDOH Screenings   Food Insecurity: No Food Insecurity (10/01/2022)  Housing: Low Risk  (10/01/2022)  Transportation Needs: No Transportation Needs (10/01/2022)  Utilities: Not At Risk (10/01/2022)  Depression (PHQ2-9): Low Risk  (07/25/2022)  Tobacco Use: Medium Risk (09/30/2022)    Readmission Risk Interventions     No data to display

## 2022-10-02 NOTE — Assessment & Plan Note (Addendum)
Most likely secondary to sepsis.  Creatinine improving and stable around 1.26 -Monitor renal function -Avoid nephrotoxins

## 2022-10-02 NOTE — Progress Notes (Signed)
  Echocardiogram 2D Echocardiogram has been performed.  Darren Ramirez 10/02/2022, 4:35 PM

## 2022-10-03 DIAGNOSIS — A419 Sepsis, unspecified organism: Secondary | ICD-10-CM

## 2022-10-03 DIAGNOSIS — N179 Acute kidney failure, unspecified: Secondary | ICD-10-CM | POA: Diagnosis not present

## 2022-10-03 DIAGNOSIS — M272 Inflammatory conditions of jaws: Secondary | ICD-10-CM | POA: Diagnosis not present

## 2022-10-03 DIAGNOSIS — B955 Unspecified streptococcus as the cause of diseases classified elsewhere: Secondary | ICD-10-CM | POA: Diagnosis not present

## 2022-10-03 DIAGNOSIS — L4050 Arthropathic psoriasis, unspecified: Secondary | ICD-10-CM | POA: Diagnosis not present

## 2022-10-03 LAB — BASIC METABOLIC PANEL
Anion gap: 8 (ref 5–15)
BUN: 19 mg/dL (ref 6–20)
CO2: 24 mmol/L (ref 22–32)
Calcium: 9 mg/dL (ref 8.9–10.3)
Chloride: 105 mmol/L (ref 98–111)
Creatinine, Ser: 1.26 mg/dL — ABNORMAL HIGH (ref 0.61–1.24)
GFR, Estimated: >60 mL/min (ref >60)
Glucose, Bld: 100 mg/dL — ABNORMAL HIGH (ref 70–99)
Potassium: 3.4 mmol/L — ABNORMAL LOW (ref 3.5–5.1)
Sodium: 137 mmol/L (ref 135–145)

## 2022-10-03 LAB — CULTURE, BLOOD (ROUTINE X 2): Special Requests: ADEQUATE

## 2022-10-03 MED ORDER — SODIUM CHLORIDE 0.9 % IV SOLN: INTRAVENOUS | Status: DC

## 2022-10-03 NOTE — Progress Notes (Signed)
    [facility] has been requested to perform a transesophageal echocardiogram on 5/1 for bacteremia.  After careful review of history and examination, the risks and benefits of transesophageal echocardiogram have been explained including risks of esophageal damage, perforation (1:10,000 risk), bleeding, pharyngeal hematoma as well as other potential complications associated with conscious sedation including aspiration, arrhythmia, respiratory failure and death. Alternatives to treatment were discussed, questions were answered. Patient is willing to proceed. Labs and vital signs stable.   Laverda Page, NP-C 10/03/2022 3:02 PM

## 2022-10-03 NOTE — Progress Notes (Addendum)
[facility]  Date of Admission:  09/30/2022           Reason for visit: Follow up on Streptococcus bacteremia  Current antibiotics: Ceftriaxone Metronidazole  ASSESSMENT:    37 y.o. male admitted with:  Streptococcus intermedius bacteremia: Secondary to odontogenic source with recent dental surgery on 09/14/2022 and CT scan showing a developing phlegmon/abscess with a small area of cortical thinning in the right mandible.  Blood cultures 09/30/2022 positive for Streptococcus intermedius.  Repeat blood cultures 10/02/2022 no growth to date.  Initial attempts at transferring patient to a tertiary center with oral surgery follow-up were unsuccessful, however, hospitalist plans to consult Dr. Chales Salmon who will be on-call today.  TTE 10/02/2022 did not show any evidence of infective endocarditis. Sepsis: Secondary to #1 and improved. Acute kidney injury: Patient presented with creatinine 1.7.  Stabilized at 1.2 as of this morning. Psoriatic arthritis: Patient currently on Remicade with last infusion approximately 5 weeks ago. Penicillin allergy: Patient reports developing thrush when he last took amoxicillin, however, he has tolerated this previously on multiple occasions.  RECOMMENDATIONS:    Continue ceftriaxone and metronidazole pending susceptibilities If penicillin sensitive, will narrow to Unasyn Follow repeat blood cultures Recommend TEE Follow-up oral surgery evaluation later today Will follow   Principal Problem:   Bacteremia due to Streptococcus Active Problems:   HYPERCHOLESTEROLEMIA   Psoriatic arthritis (HCC)   Sepsis due to undetermined organism (HCC)   Odontogenic infection of jaw   Abnormal urinalysis   AKI (acute kidney injury) (HCC)   Microcytic hypochromic anemia   Obesity (BMI 30-39.9)   Hypokalemia    MEDICATIONS:    Scheduled Meds:  ferrous sulfate  325 mg Oral Q breakfast   pantoprazole  40 mg Oral BID   predniSONE  60 mg  Oral Q breakfast   Continuous Infusions:  cefTRIAXone (ROCEPHIN)  IV 2 g (10/02/22 1159)   metronidazole 500 mg (10/03/22 0159)   PRN Meds:.acetaminophen **OR** acetaminophen, albuterol, HYDROcodone-acetaminophen, ibuprofen, lidocaine (XYLOCAINE) 2 % 1.6667 mL, diphenhydrAMINE (BENADRYL) 12.5 MG/5ML 1.6667 mL, alum & mag hydroxide-simeth (MAALOX/MYLANTA) 200-200-20 MG/5ML 1.6667 mL, ondansetron **OR** ondansetron (ZOFRAN) IV, prochlorperazine  SUBJECTIVE:   24 hour events:  No acute events overnight TTE was negative Afebrile, Tmax 98.7 Repeat blood cultures obtained and currently no growth   Patient reports feeling better again today.  His mom and dad are on the phone during our visit.  He is agreeable to having a TEE with cardiology and is anxiously awaiting evaluation by oral surgery.  Review of Systems  All other systems reviewed and are negative.     OBJECTIVE:   Blood pressure 131/83, pulse 60, temperature 98 F (36.7 C), temperature source Oral, resp. rate 20, height 5\' 7"  (1.702 m), weight 91.2 kg, SpO2 98 %. Body mass index is 31.49 kg/m.  Physical Exam Constitutional:      Appearance: Normal appearance.  HENT:     Head: Normocephalic and atraumatic.     Mouth/Throat:     Comments: No significant facial swelling appreciated today. Eyes:     Extraocular Movements: Extraocular movements intact.     Conjunctiva/sclera: Conjunctivae normal.  Pulmonary:     Effort: Pulmonary effort is normal. No respiratory distress.  Abdominal:     General: There is no distension.     Palpations: Abdomen is soft.  Musculoskeletal:        General: Normal range of motion.  Skin:    General: Skin is warm  and dry.  Neurological:     General: No focal deficit present.     Mental Status: He is alert and oriented to person, place, and time.  Psychiatric:        Mood and Affect: Mood normal.        Behavior: Behavior normal.      Lab Results: Lab Results  Component Value Date    WBC 17.8 (H) 10/02/2022   HGB 10.8 (L) 10/02/2022   HCT 31.7 (L) 10/02/2022   MCV 71.9 (L) 10/02/2022   PLT 350 10/02/2022    Lab Results  Component Value Date   NA 137 10/03/2022   K 3.4 (L) 10/03/2022   CO2 24 10/03/2022   GLUCOSE 100 (H) 10/03/2022   BUN 19 10/03/2022   CREATININE 1.26 (H) 10/03/2022   CALCIUM 9.0 10/03/2022   GFRNONAA >60 10/03/2022    Lab Results  Component Value Date   ALT 33 09/30/2022   AST 32 09/30/2022   ALKPHOS 64 09/30/2022   BILITOT 0.6 09/30/2022    No results found for: "CRP"     Component Value Date/Time   ESRSEDRATE 48 (H) 10/01/2009 1138     I have reviewed the micro and lab results in Epic.  Imaging: ECHOCARDIOGRAM COMPLETE  Result Date: 10/02/2022    ECHOCARDIOGRAM REPORT   Patient Name:   Darren Ramirez Date of Exam: 10/02/2022 Medical Rec #:  [id number]          Height:       67.0 in Accession #:    [id number]         Weight:       201.0 lb Date of Birth:  [birth year]          BSA:          2.027 m Patient Age:    37 years           BP:           135/91 mmHg Patient Gender: M                  HR:           66 bpm. Exam Location:  Inpatient Procedure: 2D Echo, Cardiac Doppler and Color Doppler Indications:    Bacteremia  History:        Patient has no prior history of Echocardiogram examinations.                 Risk Factors:Dyslipidemia.  Sonographer:    Delcie Roch RDCS Referring Phys: [id number] SUMAYYA AMIN IMPRESSIONS  1. Left ventricular ejection fraction, by estimation, is 50 to 55%. The left ventricle has low normal function. The left ventricle has no regional wall motion abnormalities. The left ventricular internal cavity size was moderately dilated. Left ventricular diastolic parameters were normal.  2. Right ventricular systolic function is normal. The right ventricular size is normal.  3. The mitral valve is normal in structure. Trivial mitral valve regurgitation. No evidence of mitral stenosis.  4. The aortic valve is  tricuspid. Aortic valve regurgitation is trivial. No aortic stenosis is present.  5. The inferior vena cava is normal in size with greater than 50% respiratory variability, suggesting right atrial pressure of 3 mmHg. FINDINGS  Left Ventricle: Left ventricular ejection fraction, by estimation, is 50 to 55%. The left ventricle has low normal function. The left ventricle has no regional wall motion abnormalities. The left ventricular internal cavity size was moderately dilated. There is no left ventricular  hypertrophy. Left ventricular diastolic parameters were normal. Right Ventricle: The right ventricular size is normal. Right ventricular systolic function is normal. Left Atrium: Left atrial size was normal in size. Right Atrium: Right atrial size was normal in size. Pericardium: There is no evidence of pericardial effusion. Mitral Valve: The mitral valve is normal in structure. Trivial mitral valve regurgitation. No evidence of mitral valve stenosis. Tricuspid Valve: The tricuspid valve is normal in structure. Tricuspid valve regurgitation is trivial. No evidence of tricuspid stenosis. Aortic Valve: The aortic valve is tricuspid. Aortic valve regurgitation is trivial. No aortic stenosis is present. Pulmonic Valve: The pulmonic valve was normal in structure. Pulmonic valve regurgitation is trivial. No evidence of pulmonic stenosis. Aorta: The aortic root is normal in size and structure. Venous: The inferior vena cava is normal in size with greater than 50% respiratory variability, suggesting right atrial pressure of 3 mmHg. IAS/Shunts: No atrial level shunt detected by color flow Doppler.  LEFT VENTRICLE PLAX 2D LVIDd:         6.10 cm      Diastology LVIDs:         3.70 cm      LV e' medial:    7.83 cm/s LV PW:         1.00 cm      LV E/e' medial:  10.0 LV IVS:        0.90 cm      LV e' lateral:   15.90 cm/s LVOT diam:     2.30 cm      LV E/e' lateral: 4.9 LV SV:         84 LV SV Index:   41 LVOT Area:     4.15 cm   LV Volumes (MOD) LV vol d, MOD A2C: 149.0 ml LV vol d, MOD A4C: 153.0 ml LV vol s, MOD A2C: 62.3 ml LV vol s, MOD A4C: 77.2 ml LV SV MOD A2C:     86.7 ml LV SV MOD A4C:     153.0 ml LV SV MOD BP:      81.9 ml RIGHT VENTRICLE RV Basal diam:  3.00 cm RV S prime:     12.40 cm/s TAPSE (M-mode): 2.3 cm LEFT ATRIUM             Index        RIGHT ATRIUM           Index LA diam:        4.30 cm 2.12 cm/m   RA Area:     16.50 cm LA Vol (A2C):   66.4 ml 32.77 ml/m  RA Volume:   43.30 ml  21.37 ml/m LA Vol (A4C):   60.2 ml 29.71 ml/m LA Biplane Vol: 66.2 ml 32.67 ml/m  AORTIC VALVE LVOT Vmax:   104.00 cm/s LVOT Vmean:  67.100 cm/s LVOT VTI:    0.202 m  AORTA Ao Root diam: 3.30 cm Ao Asc diam:  3.10 cm MITRAL VALVE MV Area (PHT): 2.16 cm    SHUNTS MV Decel Time: 352 msec    Systemic VTI:  0.20 m MV E velocity: 78.10 cm/s  Systemic Diam: 2.30 cm MV A velocity: 40.90 cm/s MV E/A ratio:  1.91 Olga Millers MD Electronically signed by Olga Millers MD Signature Date/Time: 10/02/2022/4:38:49 PM    Final     Imaging independently reviewed in Epic.    Vedia Coffer for Infectious Disease [facility] [phone] pager 10/03/2022, 8:43 AM

## 2022-10-03 NOTE — Progress Notes (Signed)
Progress Note   Patient: Darren Ramirez MRN:[id number] DOB: 11/16/1985 DOA: 09/30/2022     3 DOS: the patient was seen and examined on 10/03/2022   Brief hospital course: Taken from H&P.  Darren Ramirez is a 37 y.o. male with medical history significant of psoriasis on Remicade and anemia who presents with complaints of fever and shaking chills.  Patient had his right upper and lower wisdom tooth removed on 4/11 by Dr. Robert Riggs, Jr.  Following the procedure patient reported that he was having a lot of pain had been given Tylenol with codeine which had not been providing adequate relief.  He noted associated symptoms of increased swelling on the right side of his jaw with pain behind the right ear.  Due to his symptoms he had had poor appetite.  Approximately, 6 days ago he began having severe shaking chills with subjective fever.  He followed up in the oral surgeons office 5 days ago and was told that everything looked well and he was given an anti-inflammatory medicine.  He had not been prescribed any antibiotics.However, patient continued to worsen and reported associated symptoms of nausea, vomiting, and diarrhea during this time.   Patient was found to have significantly elevated temperature with fever up to 104, not responding much to Tylenol also cooling blanket was used..  Labs pertinent for WBC 20.1, hemoglobin 11.7, potassium 2.8, BUN 20, creatinine 1.72.  Influenza, COVID-19, and RSV screening were negative.  Urinalysis is noted moderate hemoglobin, negative leukocytes, negative nitrite, no bacteria, and 0-5 RBCs.  Blood cultures have been obtained.  Patient received initial fluid bolus, acetaminophen 1000 mg p.o., antiemetics, hydrocodone for pain, vancomycin, metronidazole, and cefepime.   CT scan of the head and neck noted concern for possible phlegmon/abscess along the right mandible..  Called [facility] who did not have oral surgery available at their  facilities.  [facility] did have oral surgery available at their facility. Discussed case with Dr. Roger Moreira of oral surgery at [facility] regards to possible need of transfer for their consultative services.  Dr. Moreira did accepted the patient but advised to be admitted under hospitalist services who declined as there was no bed availability.  Dr. Moreira can do surgery on 4/29 as a day trip, unfortunately transportation is an issue, if that can be resolved then patient can go on Monday for surgery and come back to our facility.  If became stable then he can follow-up with [facility] [place] first floor oral maxillofacial surgery on Monday where they could have him seen and to continue him on steroids, ciprofloxacin, and metronidazole.  If patient were to worsen recommended to call the transfer center again at [phone], but note patient has been taken off the transfer list.   4/28: Afebrile this morning.  MRSA PCR negative, leukocytosis improving, at 14.3.  Also found to have positive fecal occult blood.  Hemoglobin seems stable.  Iron studies consistent with anemia of chronic disease with some iron deficiency.  BMP with improvement of creatinine to 1.36.  Preliminary blood cultures remain negative in 24-hour. Clinically seems improving and should be able to follow-up as outpatient with oral surgeon at [place].  We will continue with IV antibiotics for another day.  4/29: Patient remained afebrile and hemodynamically stable.  Blood cultures now growing Streptococcus.  Clinically seems improving.  ID was also consulted and echocardiogram ordered.  We will have an oral surgeon Dr. Owsley on-call tomorrow at [facility],   who can be consulted.  Repeat blood cultures ordered  4/30: Hemodynamically stable.  Repeat blood cultures remain negative in 24-hour.  Echocardiogram without any evidence of endocarditis.  TEE was requested. Talked with Dr. Owsley, apparently he does not do inpatient consults and  will be happy to see the patient once discharge.   Assessment and Plan: Odontogenic infection of jaw -Please see above  Sepsis due to undetermined organism (HCC) Patient met sepsis criteria secondary to odontogenic infection s/p recent tooth extraction.  CT concerning for mandibular abscess.  Preliminary blood cultures with Streptococcus intermedius Admitting provider tried transferring to [facility] as there was no oral surgeon available.  Transfer was declined.  Clinically seems improving -Antibiotics are being switched with ceftriaxone and Flagyl. -ID consult -Repeat blood culture-remain negative so far -Echocardiogram with no sign of endocarditis-TEE requested -Dr. Owsley was consulted, apparently he does not see inpatient but will be happy to see him once discharged from hospital  AKI (acute kidney injury) (HCC) Most likely secondary to sepsis.  Creatinine improving and stable around 1.26 -Monitor renal function -Avoid nephrotoxins  Abnormal urinalysis UA with hemoglobinuria and negative RBCs.  CK was normal.  Microcytic hypochromic anemia Seems chronic.  FOBT positive.  Iron studies with anemia of chronic disease and some iron deficiency. -Start him on iron supplement -Outpatient GI follow-up  Psoriatic arthritis (HCC) Patient is on Remicade for which he is considered immunocompromise, but last infusion was approximately 5 weeks ago. -Continue outpatient follow-up  HYPERCHOLESTEROLEMIA Not on any medication at home. -Outpatient follow-up  Obesity (BMI 30-39.9) Estimated body mass index is 31.31 kg/m as calculated from the following:   Height as of this encounter: 5' 7" (1.702 m).   Weight as of this encounter: 90.7 kg.   -Counseling was provided  Hypokalemia Resolved.  Magnesium was normal.   Subjective: Patient was seen and examined today.  Continue to improve.  He was very concerned about seeing an oral surgeon and getting TEE.  Physical Exam: Vitals:    10/02/22 1923 10/03/22 0020 10/03/22 0428 10/03/22 0712  BP: (!) 157/89 139/87 119/86 131/83  Pulse: 86 70 62 60  Resp: 19 19 20   Temp: 98.5 F (36.9 C) 98.3 F (36.8 C) 98.7 F (37.1 C) 98 F (36.7 C)  TempSrc: Oral Oral Oral Oral  SpO2: 96% 96% 98% 98%  Weight:   91.2 kg   Height:       General.  Well-developed, overweight gentleman, in no acute distress. Pulmonary.  Lungs clear bilaterally, normal respiratory effort. CV.  Regular rate and rhythm, no JVD, rub or murmur. Abdomen.  Soft, nontender, nondistended, BS positive. CNS.  Alert and oriented .  No focal neurologic deficit. Extremities.  No edema, no cyanosis, pulses intact and symmetrical. Psychiatry.  Judgment and insight appears normal.     Data Reviewed: Prior data reviewed  Family Communication: Discussed with patient and mother at bedside.  Disposition: Status is: Inpatient Remains inpatient appropriate because: Severity of illness  Planned Discharge Destination: Home  Time spent: 45 minutes  This record has been created using Dragon voice recognition software. Errors have been sought and corrected,but may not always be located. Such creation errors do not reflect on the standard of care.   Author: Axl Rodino, MD 10/03/2022 2:14 PM  For on call review [email or web address].  

## 2022-10-03 NOTE — Assessment & Plan Note (Signed)
Seems chronic.  FOBT positive.  Iron studies with anemia of chronic disease and some iron deficiency. -Start him on iron supplement -Outpatient GI follow-up

## 2022-10-03 NOTE — H&P (View-Only) (Signed)
Progress Note   PatientHayden Kihara Ramirez RUE:[id number] DOB: 1985-11-16 DOA: 09/30/2022     3 DOS: the patient was seen and examined on 10/03/2022   Brief hospital course: Taken from H&P.  Darren Ramirez is a 37 y.o. male with medical history significant of psoriasis on Remicade and anemia who presents with complaints of fever and shaking chills.  Patient had his right upper and lower wisdom tooth removed on 4/11 by Dr. Leonie Man, Darren Ramirez.  Following the procedure patient reported that he was having a lot of pain had been given Tylenol with codeine which had not been providing adequate relief.  He noted associated symptoms of increased swelling on the right side of his jaw with pain behind the right ear.  Due to his symptoms he had had poor appetite.  Approximately, 6 days ago he began having severe shaking chills with subjective fever.  He followed up in the oral surgeons office 5 days ago and was told that everything looked well and he was given an anti-inflammatory medicine.  He had not been prescribed any antibiotics.However, patient continued to worsen and reported associated symptoms of nausea, vomiting, and diarrhea during this time.   Patient was found to have significantly elevated temperature with fever up to 104, not responding much to Tylenol also cooling blanket was used..  Labs pertinent for WBC 20.1, hemoglobin 11.7, potassium 2.8, BUN 20, creatinine 1.72.  Influenza, COVID-19, and RSV screening were negative.  Urinalysis is noted moderate hemoglobin, negative leukocytes, negative nitrite, no bacteria, and 0-5 RBCs.  Blood cultures have been obtained.  Patient received initial fluid bolus, acetaminophen 1000 mg p.o., antiemetics, hydrocodone for pain, vancomycin, metronidazole, and cefepime.   CT scan of the head and neck noted concern for possible phlegmon/abscess along the right mandible.[name] and [facility] who did not have oral surgery available at their  facilities.  [facility] did have oral surgery available at their facility. Discussed case with Dr. Marcelle Smiling of oral surgery at [facility] regards to possible need of transfer for their consultative services.  Dr. Ludwig Clarks did accepted the patient but advised to be admitted under hospitalist services who declined as there was no bed availability.  Dr. Ludwig Clarks can do surgery on 4/29 as a day trip, unfortunately transportation is an issue, if that can be resolved then patient can go on Monday for surgery and come back to our facility.  If became stable then he can follow-up with [facility] of dentistry [facility] first floor oral maxillofacial surgery on Monday where they could have him seen and to continue him on steroids, ciprofloxacin, and metronidazole.  If patient were to worsen recommended to call the transfer center again at [phone], but note patient has been taken off the transfer list.   4/28: Afebrile this morning.  MRSA PCR negative, leukocytosis improving, at 14.3.  Also found to have positive fecal occult blood.  Hemoglobin seems stable.  Iron studies consistent with anemia of chronic disease with some iron deficiency.  BMP with improvement of creatinine to 1.36.  Preliminary blood cultures remain negative in 24-hour. Clinically seems improving and should be able to follow-up as outpatient with oral surgeon at [facility].  We will continue with IV antibiotics for another day.  4/29: Patient remained afebrile and hemodynamically stable.  Blood cultures now growing Streptococcus.  Clinically seems improving.  ID was also consulted and echocardiogram ordered.  We will have an oral surgeon Dr. Chales Salmon on-call tomorrow at [facility],  who can be consulted.  Repeat blood cultures ordered  4/30: Hemodynamically stable.  Repeat blood cultures remain negative in 24-hour.  Echocardiogram without any evidence of endocarditis.  TEE was requested. Talked with Dr. Chales Salmon, apparently he does not do inpatient consults and  will be happy to see the patient once discharge.   Assessment and Plan: Odontogenic infection of jaw -Please see above  Sepsis due to undetermined organism [facility]) Patient met sepsis criteria secondary to odontogenic infection s/p recent tooth extraction.  CT concerning for mandibular abscess.  Preliminary blood cultures with Streptococcus intermedius Admitting provider tried transferring to [facility] as there was no oral surgeon available.  Transfer was declined.  Clinically seems improving -Antibiotics are being switched with ceftriaxone and Flagyl. -ID consult -Repeat blood culture-remain negative so far -Echocardiogram with no sign of endocarditis-TEE requested -Dr. Chales Salmon was consulted, apparently he does not see inpatient but will be happy to see him once discharged from hospital  AKI (acute kidney injury) [facility]) Most likely secondary to sepsis.  Creatinine improving and stable around 1.26 -Monitor renal function -Avoid nephrotoxins  Abnormal urinalysis UA with hemoglobinuria and negative RBCs.  CK was normal.  Microcytic hypochromic anemia Seems chronic.  FOBT positive.  Iron studies with anemia of chronic disease and some iron deficiency. -Start him on iron supplement -Outpatient GI follow-up  Psoriatic arthritis (HCC) Patient is on Remicade for which he is considered immunocompromise, but last infusion was approximately 5 weeks ago. -Continue outpatient follow-up  HYPERCHOLESTEROLEMIA Not on any medication at home. -Outpatient follow-up  Obesity (BMI 30-39.9) Estimated body mass index is 31.31 kg/m as calculated from the following:   Height as of this encounter: 5\' 7"  (1.702 m).   Weight as of this encounter: 90.7 kg.   -Counseling was provided  Hypokalemia Resolved.  Magnesium was normal.   Subjective: Patient was seen and examined today.  Continue to improve.  He was very concerned about seeing an oral surgeon and getting TEE.  Physical Exam: Vitals:    10/02/22 1923 10/03/22 0020 10/03/22 0428 10/03/22 0712  BP: (!) 157/89 139/87 119/86 131/83  Pulse: 86 70 62 60  Resp: 19 19 20    Temp: 98.5 F (36.9 C) 98.3 F (36.8 C) 98.7 F (37.1 C) 98 F (36.7 C)  TempSrc: Oral Oral Oral Oral  SpO2: 96% 96% 98% 98%  Weight:   91.2 kg   Height:       General.  Well-developed, overweight gentleman, in no acute distress. Pulmonary.  Lungs clear bilaterally, normal respiratory effort. CV.  Regular rate and rhythm, no JVD, rub or murmur. Abdomen.  Soft, nontender, nondistended, BS positive. CNS.  Alert and oriented .  No focal neurologic deficit. Extremities.  No edema, no cyanosis, pulses intact and symmetrical. Psychiatry.  Judgment and insight appears normal.     Data Reviewed: Prior data reviewed  Family Communication: Discussed with patient and mother at bedside.  Disposition: Status is: Inpatient Remains inpatient appropriate because: Severity of illness  Planned Discharge Destination: Home  Time spent: 45 minutes  This record has been created using Conservation officer, historic buildings. Errors have been sought and corrected,but may not always be located. Such creation errors do not reflect on the standard of care.   Author: Arnetha Courser, MD 10/03/2022 2:14 PM  For on call review [email or web address].

## 2022-10-04 ENCOUNTER — Inpatient Hospital Stay (HOSPITAL_COMMUNITY): Payer: BC Managed Care – PPO | Admitting: Anesthesiology

## 2022-10-04 ENCOUNTER — Inpatient Hospital Stay (HOSPITAL_COMMUNITY): Payer: BC Managed Care – PPO

## 2022-10-04 ENCOUNTER — Encounter (HOSPITAL_COMMUNITY)
Admission: EM | Disposition: A | Discharge status: Home / Self Care | Payer: Self-pay | Source: Home / Self Care | Attending: Internal Medicine

## 2022-10-04 DIAGNOSIS — N179 Acute kidney failure, unspecified: Secondary | ICD-10-CM | POA: Diagnosis not present

## 2022-10-04 DIAGNOSIS — R8290 Unspecified abnormal findings in urine: Secondary | ICD-10-CM | POA: Diagnosis not present

## 2022-10-04 DIAGNOSIS — R7881 Bacteremia: Secondary | ICD-10-CM

## 2022-10-04 DIAGNOSIS — A419 Sepsis, unspecified organism: Secondary | ICD-10-CM | POA: Diagnosis not present

## 2022-10-04 DIAGNOSIS — M272 Inflammatory conditions of jaws: Secondary | ICD-10-CM | POA: Diagnosis not present

## 2022-10-04 HISTORY — PX: TEE WITHOUT CARDIOVERSION: SHX5443

## 2022-10-04 LAB — BASIC METABOLIC PANEL
Anion gap: 7 (ref 5–15)
BUN: 15 mg/dL (ref 6–20)
CO2: 27 mmol/L (ref 22–32)
Calcium: 8.4 mg/dL — ABNORMAL LOW (ref 8.9–10.3)
Chloride: 103 mmol/L (ref 98–111)
Creatinine, Ser: 1.37 mg/dL — ABNORMAL HIGH (ref 0.61–1.24)
GFR, Estimated: >60 mL/min (ref >60)
Glucose, Bld: 124 mg/dL — ABNORMAL HIGH (ref 70–99)
Potassium: 3.1 mmol/L — ABNORMAL LOW (ref 3.5–5.1)
Sodium: 137 mmol/L (ref 135–145)

## 2022-10-04 LAB — CULTURE, BLOOD (ROUTINE X 2): Culture: NO GROWTH

## 2022-10-04 LAB — ECHO TEE

## 2022-10-04 SURGERY — ECHOCARDIOGRAM, TRANSESOPHAGEAL
Anesthesia: Monitor Anesthesia Care

## 2022-10-04 MED ORDER — AMOXICILLIN-POT CLAVULANATE 875-125 MG PO TABS
1.0000 | ORAL_TABLET | Freq: Two times a day (BID) | ORAL | Status: DC
  Administered 2022-10-04: 1 via ORAL
  Filled 2022-10-04: qty 1

## 2022-10-04 MED ORDER — AMOXICILLIN-POT CLAVULANATE 875-125 MG PO TABS: 1.0000 | ORAL_TABLET | Freq: Two times a day (BID) | ORAL | 0 refills | Status: AC

## 2022-10-04 MED ORDER — LIDOCAINE HCL (CARDIAC) PF 100 MG/5ML IV SOSY
PREFILLED_SYRINGE | INTRAVENOUS | Status: DC | PRN
  Administered 2022-10-04: 100 mg via INTRATRACHEAL

## 2022-10-04 MED ORDER — PANTOPRAZOLE SODIUM 40 MG PO TBEC: 40.0000 mg | DELAYED_RELEASE_TABLET | Freq: Every day | ORAL | 1 refills | Status: DC

## 2022-10-04 MED ORDER — FERROUS SULFATE 325 (65 FE) MG PO TABS: 325.0000 mg | ORAL_TABLET | Freq: Every day | ORAL | 3 refills | Status: AC

## 2022-10-04 MED ORDER — POTASSIUM CHLORIDE CRYS ER 20 MEQ PO TBCR
40.0000 meq | EXTENDED_RELEASE_TABLET | Freq: Once | ORAL | Status: AC
  Administered 2022-10-04: 40 meq via ORAL
  Filled 2022-10-04: qty 2

## 2022-10-04 MED ORDER — PROPOFOL 500 MG/50ML IV EMUL
INTRAVENOUS | Status: DC | PRN
  Administered 2022-10-04: 125 ug/kg/min via INTRAVENOUS

## 2022-10-04 MED ORDER — PROPOFOL 10 MG/ML IV BOLUS
INTRAVENOUS | Status: DC | PRN
  Administered 2022-10-04 (×3): 50 mg via INTRAVENOUS

## 2022-10-04 MED ORDER — POTASSIUM CHLORIDE CRYS ER 20 MEQ PO TBCR
40.0000 meq | EXTENDED_RELEASE_TABLET | ORAL | Status: AC
  Administered 2022-10-04: 40 meq via ORAL
  Filled 2022-10-04: qty 2

## 2022-10-04 NOTE — Op Note (Signed)
INDICATIONS: bacteremia  PROCEDURE:   Informed consent was obtained prior to the procedure. The risks, benefits and alternatives for the procedure were discussed and the patient comprehended these risks.  Risks include, but are not limited to, cough, sore throat, vomiting, nausea, somnolence, esophageal and stomach trauma or perforation, bleeding, low blood pressure, aspiration, pneumonia, infection, trauma to the teeth and death.    After a procedural time-out, the oropharynx was anesthetized with 20% benzocaine spray.   During this procedure the patient was administered IV propofol by Anesthesiology, Dr. Desmond Lope  The transesophageal probe was inserted in the esophagus and stomach without difficulty and multiple views were obtained.  The patient was kept under observation until the patient left the procedure room.  The patient left the procedure room in stable condition.   Agitated microbubble saline contrast was not administered.  COMPLICATIONS:    There were no immediate complications.  FINDINGS:  Normal TEE. No vegetations area seen.  RECOMMENDATIONS:     No evidence of endocarditis.  Time Spent Directly with the Patient:    Darren Ramirez 10/04/2022, 10:30 AM

## 2022-10-04 NOTE — Progress Notes (Signed)
Discharge instructions reviewed with pt and his parents.  Copy of instructions given to pt. Pt's scripts were sent to his pharmacy for pick up and informed of so. Encouraged to follow up with oral surgeon's office, as an appointment has been made per instructions, pt to try to get earlier date if possible.  Pt d/c'd with belongings, with his mother. Pt declined wheelchair, wanted to walk, pt with steady gait, and has been walking in room and unit independently.

## 2022-10-04 NOTE — Progress Notes (Addendum)
Brief ID follow-up note:  Patient at procedure this morning for TEE which resulted as showing no evidence of infective endocarditis.  Notified by primary hospitalist that patient was eager to discharge home and asking for final recommendations.  Blood cx from admission with Streptococcus intermedius found to be penicillin sensitive.  Repeat blood cultures 10/02/2022 remain no growth to date.  Assessment/plan Patient with Streptococcus intermedius bacteremia secondary to odontogenic source with recent dental surgery on 09/14/2022.  Will transition to Augmentin p.o. in anticipation of discharge.  Patient to follow-up with oral surgery as an outpatient for possible intervention, but they do not provide inpatient consultative services.    Will recommend 4 more weeks of antibiotics at discharge from negative blood cultures through proximately 10/30/2022 given the CT findings of developing phlegmon/abscess until he can follow-up with oral surgery.  CT also noted a small area of cortical thinning in the right mandible of unknown clear significance.  Patient has follow-up arranged at [facility] ID in about 2 weeks with Dr Renold Don.  Further antibiotics decisions to be made at that time.  Will sign off, please call as needed.   Vedia Coffer for Infectious Disease [facility] 10/04/2022, 1:36 PM

## 2022-10-04 NOTE — Anesthesia Postprocedure Evaluation (Signed)
Anesthesia Post Note  Patient: Darren Ramirez  Procedure(s) Performed: TRANSESOPHAGEAL ECHOCARDIOGRAM     Patient location during evaluation: Cath Lab Anesthesia Type: MAC Level of consciousness: awake and alert Pain management: pain level controlled Vital Signs Assessment: post-procedure vital signs reviewed and stable Respiratory status: spontaneous breathing, nonlabored ventilation, respiratory function stable and patient connected to nasal cannula oxygen Cardiovascular status: stable and blood pressure returned to baseline Postop Assessment: no apparent nausea or vomiting Anesthetic complications: no   No notable events documented.  Last Vitals:  Vitals:   10/04/22 1050 10/04/22 1055  BP: 139/87   Pulse: 72 72  Resp: 11 18  Temp:    SpO2: 99% 98%    Last Pain:  Vitals:   10/04/22 1050  TempSrc:   PainSc: 0-No pain                 Collene Schlichter

## 2022-10-04 NOTE — Anesthesia Procedure Notes (Signed)
Procedure Name: MAC Date/Time: 10/04/2022 10:16 AM  Performed by: Marena Chancy, CRNAPre-anesthesia Checklist: Patient identified, Emergency Drugs available, Suction available, Patient being monitored and Timeout performed Patient Re-evaluated:Patient Re-evaluated prior to induction Oxygen Delivery Method: Simple face mask Preoxygenation: Pre-oxygenation with 100% oxygen Induction Type: IV induction

## 2022-10-04 NOTE — TOC Transition Note (Signed)
Transition of Care [facility]) - CM/SW Discharge Note   Patient Details  Name: Darren Ramirez MRN: [id number] Date of Birth: 1985/06/06  Transition of Care [facility]) CM/SW Contact:  Leone Haven, RN Phone Number: 10/04/2022, 2:01 PM   Clinical Narrative:    Patient is for dc home today, per ID Patient to follow-up with oral surgery as an outpatient for possible intervention,  patient has been switched to po meds.  He has transport home.         Patient Goals and CMS Choice      Discharge Placement                         Discharge Plan and Services Additional resources added to the After Visit Summary for                                       Social Determinants of Health (SDOH) Interventions SDOH Screenings   Food Insecurity: No Food Insecurity (10/01/2022)  Housing: Low Risk  (10/01/2022)  Transportation Needs: No Transportation Needs (10/01/2022)  Utilities: Not At Risk (10/01/2022)  Depression (PHQ2-9): Low Risk  (07/25/2022)  Tobacco Use: Medium Risk (09/30/2022)     Readmission Risk Interventions     No data to display

## 2022-10-04 NOTE — Anesthesia Preprocedure Evaluation (Signed)
Anesthesia Evaluation  Patient identified by MRN, date of birth, ID band Patient awake    Reviewed: Allergy & Precautions, NPO status , Patient's Chart, lab work & pertinent test results  Airway Mallampati: II  TM Distance: >3 FB Neck ROM: Full    Dental  (+) Teeth Intact, Dental Advisory Given, Poor Dentition, Chipped,    Pulmonary neg pulmonary ROS, former smoker   Pulmonary exam normal breath sounds clear to auscultation       Cardiovascular negative cardio ROS Normal cardiovascular exam Rhythm:Regular Rate:Normal     Neuro/Psych Seizures -,   Neuromuscular disease    GI/Hepatic negative GI ROS, Neg liver ROS,,,  Endo/Other  negative endocrine ROS    Renal/GU Renal disease (AKI)     Musculoskeletal  (+) Arthritis ,    Abdominal   Peds  Hematology  (+) Blood dyscrasia, anemia Baceteremia   Anesthesia Other Findings Day of surgery medications reviewed with the patient.  Reproductive/Obstetrics                             Anesthesia Physical Anesthesia Plan  ASA: 3  Anesthesia Plan: MAC   Post-op Pain Management: Minimal or no pain anticipated   Induction: Intravenous  PONV Risk Score and Plan: 1 and TIVA and Treatment may vary due to age or medical condition  Airway Management Planned: Natural Airway and Simple Face Mask  Additional Equipment:   Intra-op Plan:   Post-operative Plan:   Informed Consent: I have reviewed the patients History and Physical, chart, labs and discussed the procedure including the risks, benefits and alternatives for the proposed anesthesia with the patient or authorized representative who has indicated his/her understanding and acceptance.     Dental advisory given  Plan Discussed with: CRNA  Anesthesia Plan Comments:        Anesthesia Quick Evaluation

## 2022-10-04 NOTE — Transfer of Care (Signed)
Immediate Anesthesia Transfer of Care Note  Patient: Darren Ramirez  Procedure(s) Performed: TRANSESOPHAGEAL ECHOCARDIOGRAM  Patient Location: Cath Lab  Anesthesia Type:MAC  Level of Consciousness: awake, alert , and oriented  Airway & Oxygen Therapy: Patient Spontanous Breathing  Post-op Assessment: Report given to RN and Post -op Vital signs reviewed and stable  Post vital signs: Reviewed and stable  Last Vitals:  Vitals Value Taken Time  BP    Temp    Pulse    Resp    SpO2      Last Pain:  Vitals:   10/04/22 0439  TempSrc: Oral  PainSc:          Complications: No notable events documented.

## 2022-10-04 NOTE — Discharge Summary (Signed)
Physician Discharge Summary   Patient: [facility] MRN: [id number] DOB: 02/14/1986  Admit date:     09/30/2022  Discharge date: 10/04/22  Discharge Physician: Arnetha Courser   PCP: Georgina Quint, MD   Recommendations at discharge:  Please obtain CBC and BMP within a week Patient was having intermittent elevation of blood pressure, please monitor and start medication as appropriate Patient was also found to have positive FOBT and will need outpatient gastroenterology appointment for further investigation. He is being discharged on 26 more days of Augmentin for Streptococcus intermedius bacteremia. Please follow-up with oral surgery Follow-up with primary care provider within a week  Discharge Diagnoses: Principal Problem:   Bacteremia due to Streptococcus Active Problems:   Sepsis due to undetermined organism (HCC)   Odontogenic infection of jaw   AKI (acute kidney injury) (HCC)   Abnormal urinalysis   Microcytic hypochromic anemia   Psoriatic arthritis (HCC)   HYPERCHOLESTEROLEMIA   Obesity (BMI 30-39.9)   Hypokalemia   Sepsis [facility])   [facility] Course: Taken from H&P.  Favor [name] is a 37 y.o. male with medical history significant of psoriasis on Remicade and anemia who presents with complaints of fever and shaking chills.  Patient had his right upper and lower wisdom tooth removed on 4/11 by Dr. Leonie Man, [name].  Following the procedure patient reported that he was having a lot of pain had been given Tylenol with codeine which had not been providing adequate relief.  He noted associated symptoms of increased swelling on the right side of his jaw with pain behind the right ear.  Due to his symptoms he had had poor appetite.  Approximately, 6 days ago he began having severe shaking chills with subjective fever.  He followed up in the oral surgeons office 5 days ago and was told that everything looked well and he was given an anti-inflammatory medicine.  He had  not been prescribed any antibiotics.However, patient continued to worsen and reported associated symptoms of nausea, vomiting, and diarrhea during this time.   Patient was found to have significantly elevated temperature with fever up to 104, not responding much to Tylenol also cooling blanket was used..  Labs pertinent for WBC 20.1, hemoglobin 11.7, potassium 2.8, BUN 20, creatinine 1.72.  Influenza, COVID-19, and RSV screening were negative.  Urinalysis is noted moderate hemoglobin, negative leukocytes, negative nitrite, no bacteria, and 0-5 RBCs.  Blood cultures have been obtained.  Patient received initial fluid bolus, acetaminophen 1000 mg p.o., antiemetics, hydrocodone for pain, vancomycin, metronidazole, and cefepime.   CT scan of the head and neck noted concern for possible phlegmon/abscess along the right mandible.[facility] who did not have oral surgery available at their facilities.  [facility] did have oral surgery available at their facility. Discussed case with Dr. Marcelle Smiling of oral surgery at [facility] regards to possible need of transfer for their consultative services.  Dr. Ludwig Clarks did accepted the patient but advised to be admitted under hospitalist services who declined as there was no bed availability.  Dr. Ludwig Clarks can do surgery on 4/29 as a day trip, unfortunately transportation is an issue, if that can be resolved then patient can go on Monday for surgery and come back to our facility.  If became stable then he can follow-up with [facility] first floor oral maxillofacial surgery on Monday where they could have him seen and to continue him on steroids, ciprofloxacin, and metronidazole.  If patient  were to worsen recommended to call the transfer [facility] again at [phone], but note patient has been taken off the transfer list.   4/28: Afebrile this morning.  MRSA PCR negative, leukocytosis improving, at 14.3.  Also found to have positive  fecal occult blood.  Hemoglobin seems stable.  Iron studies consistent with anemia of chronic disease with some iron deficiency.  BMP with improvement of creatinine to 1.36.  Preliminary blood cultures remain negative in 24-hour. Clinically seems improving and should be able to follow-up as outpatient with oral surgeon at [facility].  We will continue with IV antibiotics for another day.  4/29: Patient remained afebrile and hemodynamically stable.  Blood cultures now growing Streptococcus.  Clinically seems improving.  ID was also consulted and echocardiogram ordered.  We will have an oral surgeon Dr. Chales Salmon on-call tomorrow at [facility], who can be consulted.  Repeat blood cultures ordered  4/30: Hemodynamically stable.  Repeat blood cultures remain negative in 24-hour.  Echocardiogram without any evidence of endocarditis.  TEE was requested. Talked with Dr. Chales Salmon, apparently he does not do inpatient consults and will be happy to see the patient once discharge.  5/1: Patient remained hemodynamically stable.  TEE today was negative for any vegetation. We made the appointment with oral surgery and patient is being discharged on Augmentin based on his blood culture results for a total of 4-week course.  He will follow-up with ID as outpatient.  Patient has stable creatinine but seems like above baseline or normal.  He was also having intermittently elevated blood pressure.  Pain can be contributory.  He was advised to check his blood pressure regularly at home while he is resting and take that log to his primary care provider to see if he need to be started on a medication.  He was also advised to keep monitoring his renal function and maintain his hydration.  We also discussed about positive FOBT and advised to follow-up with gastroenterology as an outpatient as he will need endoscopies to rule out any underlying lesion.  Case was cussed in detail with patient and his mother at bedside.  Patient will  continue with his home medications and need to have a close follow-up with his providers for further recommendations.  Assessment and Plan: Odontogenic infection of jaw -Please see above  Sepsis due to undetermined organism [facility]) Patient met sepsis criteria secondary to odontogenic infection s/p recent tooth extraction.  CT concerning for mandibular abscess.  Preliminary blood cultures with Streptococcus intermedius Admitting provider tried transferring to [facility] as there was no oral surgeon available.  Transfer was declined.  Clinically seems improving -Antibiotics are being switched with ceftriaxone and Flagyl. -ID consult -Repeat blood culture-remain negative so far -Echocardiogram with no sign of endocarditis-TEE requested -Dr. Chales Salmon was consulted, apparently he does not see inpatient but will be happy to see him once discharged from [facility]  AKI (acute kidney injury) [facility]) Most likely secondary to sepsis.  Creatinine improving and stable around 1.26 -Monitor renal function -Avoid nephrotoxins  Abnormal urinalysis UA with hemoglobinuria and negative RBCs.  CK was normal.  Microcytic hypochromic anemia Seems chronic.  FOBT positive.  Iron studies with anemia of chronic disease and some iron deficiency. -Start him on iron supplement -Outpatient GI follow-up  Psoriatic arthritis (HCC) Patient is on Remicade for which he is considered immunocompromise, but last infusion was approximately 5 weeks ago. -Continue outpatient follow-up  HYPERCHOLESTEROLEMIA Not on any medication at home. -Outpatient follow-up  Obesity (BMI 30-39.9)  Estimated body mass index is 31.31 kg/m as calculated from the following:   Height as of this encounter: 5\' 7"  (1.702 m).   Weight as of this encounter: 90.7 kg.   -Counseling was provided  Hypokalemia Resolved.  Magnesium was normal.    Consultants: Infectious disease.  Curbside oral surgery Procedures performed: None Disposition:  Home Diet recommendation:  Discharge Diet Orders (From admission, onward)     Start     Ordered   10/04/22 0000  Diet - low sodium heart healthy        10/04/22 1326           Cardiac diet DISCHARGE MEDICATION: Allergies as of 10/04/2022       Reactions   Amoxicillin Other (See Comments)   Mouth sores/thrush         Medication List     STOP taking these medications    acetaminophen-codeine 300-30 MG tablet Commonly known as: TYLENOL #3   magic mouthwash (lidocaine, diphenhydrAMINE, alum & mag hydroxide) suspension       TAKE these medications    acetaminophen 500 MG tablet Commonly known as: TYLENOL Take 500 mg by mouth 2 (two) times daily as needed for moderate pain.   amoxicillin-clavulanate 875-125 MG tablet Commonly known as: AUGMENTIN Take 1 tablet by mouth every 12 (twelve) hours for 26 days. Stop date 10/30/22   cetirizine 10 MG tablet Commonly known as: ZYRTEC Take 10 mg by mouth daily as needed for allergies.   clobetasol 0.05 % Gel Commonly known as: TEMOVATE Apply 1 Application topically as needed.   dextromethorphan-guaiFENesin 30-600 MG 12hr tablet Commonly known as: MUCINEX DM Take 1 tablet by mouth daily as needed for cough.   ferrous sulfate 325 (65 FE) MG tablet Take 1 tablet (325 mg total) by mouth daily with breakfast. Start taking on: Oct 05, 2022   flurbiprofen 100 MG tablet Commonly known as: ANSAID Take 100 mg by mouth 2 (two) times daily.   ibuprofen 800 MG tablet Commonly known as: ADVIL Take 800 mg by mouth 2 (two) times daily as needed for moderate pain. What changed: Another medication with the same name was removed. Continue taking this medication, and follow the directions you see here.   pantoprazole 40 MG tablet Commonly known as: PROTONIX Take 1 tablet (40 mg total) by mouth daily.   Remicade 100 MG injection Generic drug: inFLIXimab Inject into the skin every 6 (six) weeks.        Follow-up Information      Delcie Roch, DMD. Schedule an appointment as soon as possible for a visit in 2 day(s).   Specialty: Oral Surgery Contact information: [address] [place] [address] [phone]         Dutch Quint, MD. Schedule an appointment as soon as possible for a visit in 1 day(s).   Specialty: Oral Surgery Why: Please make appointment to be seen in 1 to 2 days Contact information: [address] [place] [address] [phone]         Georgina Quint, MD .   Specialty: Internal Medicine Contact information: [address] [place] [address] [phone]         Dutch Quint, MD Follow up on 10/10/2022.   Specialty: Oral Surgery Why: 3:15PM Contact information: [address] [place] [address] [phone]                Discharge Exam: Ceasar Mons Weights   10/02/22 0109 10/03/22  1610 10/04/22 0439  Weight: 91.2 kg 91.2 kg 90.4 kg   General.  Obese gentleman, in no acute distress. Pulmonary.  Lungs clear bilaterally, normal respiratory effort. CV.  Regular rate and rhythm, no JVD, rub or murmur. Abdomen.  Soft, nontender, nondistended, BS positive. CNS.  Alert and oriented .  No focal neurologic deficit. Extremities.  No edema, no cyanosis, pulses intact and symmetrical. Psychiatry.  Judgment and insight appears normal.   Condition at discharge: stable  The results of significant diagnostics from this hospitalization (including imaging, microbiology, ancillary and laboratory) are listed below for reference.   Imaging Studies: EP STUDY  Result Date: 10/04/2022 See surgical note for result.  ECHOCARDIOGRAM COMPLETE  Result Date: 10/02/2022    ECHOCARDIOGRAM REPORT   Patient Name:   Juliocesar Blasius II Date of Exam: 10/02/2022 Medical Rec #:  [id number]          Height:       67.0 in Accession #:    [id number]         Weight:       201.0 lb Date of Birth:  06/26/1985          BSA:           2.027 m Patient Age:    37 years           BP:           135/91 mmHg Patient Gender: M                  HR:           66 bpm. Exam Location:  Inpatient Procedure: 2D Echo, Cardiac Doppler and Color Doppler Indications:    Bacteremia  History:        Patient has no prior history of Echocardiogram examinations.                 Risk Factors:Dyslipidemia.  Sonographer:    Delcie Roch RDCS Referring Phys: [id number] Delaynie Stetzer IMPRESSIONS  1. Left ventricular ejection fraction, by estimation, is 50 to 55%. The left ventricle has low normal function. The left ventricle has no regional wall motion abnormalities. The left ventricular internal cavity size was moderately dilated. Left ventricular diastolic parameters were normal.  2. Right ventricular systolic function is normal. The right ventricular size is normal.  3. The mitral valve is normal in structure. Trivial mitral valve regurgitation. No evidence of mitral stenosis.  4. The aortic valve is tricuspid. Aortic valve regurgitation is trivial. No aortic stenosis is present.  5. The inferior vena cava is normal in size with greater than 50% respiratory variability, suggesting right atrial pressure of 3 mmHg. FINDINGS  Left Ventricle: Left ventricular ejection fraction, by estimation, is 50 to 55%. The left ventricle has low normal function. The left ventricle has no regional wall motion abnormalities. The left ventricular internal cavity size was moderately dilated. There is no left ventricular hypertrophy. Left ventricular diastolic parameters were normal. Right Ventricle: The right ventricular size is normal. Right ventricular systolic function is normal. Left Atrium: Left atrial size was normal in size. Right Atrium: Right atrial size was normal in size. Pericardium: There is no evidence of pericardial effusion. Mitral Valve: The mitral valve is normal in structure. Trivial mitral valve regurgitation. No evidence of mitral valve stenosis. Tricuspid Valve: The  tricuspid valve is normal in structure. Tricuspid valve regurgitation is trivial. No evidence of tricuspid stenosis. Aortic Valve: The aortic valve is tricuspid. Aortic valve regurgitation is trivial.  No aortic stenosis is present. Pulmonic Valve: The pulmonic valve was normal in structure. Pulmonic valve regurgitation is trivial. No evidence of pulmonic stenosis. Aorta: The aortic root is normal in size and structure. Venous: The inferior vena cava is normal in size with greater than 50% respiratory variability, suggesting right atrial pressure of 3 mmHg. IAS/Shunts: No atrial level shunt detected by color flow Doppler.  LEFT VENTRICLE PLAX 2D LVIDd:         6.10 cm      Diastology LVIDs:         3.70 cm      LV e' medial:    7.83 cm/s LV PW:         1.00 cm      LV E/e' medial:  10.0 LV IVS:        0.90 cm      LV e' lateral:   15.90 cm/s LVOT diam:     2.30 cm      LV E/e' lateral: 4.9 LV SV:         84 LV SV Index:   41 LVOT Area:     4.15 cm  LV Volumes (MOD) LV vol d, MOD A2C: 149.0 ml LV vol d, MOD A4C: 153.0 ml LV vol s, MOD A2C: 62.3 ml LV vol s, MOD A4C: 77.2 ml LV SV MOD A2C:     86.7 ml LV SV MOD A4C:     153.0 ml LV SV MOD BP:      81.9 ml RIGHT VENTRICLE RV Basal diam:  3.00 cm RV S prime:     12.40 cm/s TAPSE (M-mode): 2.3 cm LEFT ATRIUM             Index        RIGHT ATRIUM           Index LA diam:        4.30 cm 2.12 cm/m   RA Area:     16.50 cm LA Vol (A2C):   66.4 ml 32.77 ml/m  RA Volume:   43.30 ml  21.37 ml/m LA Vol (A4C):   60.2 ml 29.71 ml/m LA Biplane Vol: 66.2 ml 32.67 ml/m  AORTIC VALVE LVOT Vmax:   104.00 cm/s LVOT Vmean:  67.100 cm/s LVOT VTI:    0.202 m  AORTA Ao Root diam: 3.30 cm Ao Asc diam:  3.10 cm MITRAL VALVE MV Area (PHT): 2.16 cm    SHUNTS MV Decel Time: 352 msec    Systemic VTI:  0.20 m MV E velocity: 78.10 cm/s  Systemic Diam: 2.30 cm MV A velocity: 40.90 cm/s MV E/A ratio:  1.91 Olga Millers MD Electronically signed by Olga Millers MD Signature Date/Time:  10/02/2022/4:38:49 PM    Final    CT SOFT TISSUE NECK W CONTRAST  Result Date: 09/30/2022 CLINICAL DATA:  Soft tissue infection suspected EXAM: CT NECK WITH CONTRAST TECHNIQUE: Multidetector CT imaging of the neck was performed using the standard protocol following the bolus administration of intravenous contrast. RADIATION DOSE REDUCTION: This exam was performed according to the departmental dose-optimization program which includes automated exposure control, adjustment of the mA and/or kV according to patient size and/or use of iterative reconstruction technique. CONTRAST:  75mL OMNIPAQUE IOHEXOL 350 MG/ML SOLN COMPARISON:  No prior CT neck available, correlation is made with CT head and cervical spine 03/04/2021 FINDINGS: Pharynx and larynx: Normal. No mass or swelling. Salivary glands: No inflammation, mass, or stone. Thyroid: Normal. Lymph nodes: None enlarged or abnormal  density. Vascular: Patent. Limited intracranial: Negative. Visualized orbits: Negative. Mastoids and visualized paranasal sinuses: Minimal mucosal thickening in the ethmoid air cells. Otherwise clear paranasal sinuses. The mastoids are well aerated. Skeleton: No acute fracture or suspicious osseous lesion. Sequela of recent removal of the right maxillary and mandibular third molars. Small area of low-density along the lateral aspect of the right mandible, adjacent to the extraction socket and subjacent to the inferior aspect of the superficial part of the masseter, without significant peripheral enhancement (series 3, image 64 and series 6, image 48), which could represent phlegmon or developing abscess. This area measures approximately 15 x 5 x 15 mm (AP x TR x CC) (series 3, image 64 and series 6, image 48). This is adjacent to a small area of cortical thinning in the right mandible (series 4, image 63). No definite inflammatory changes adjacent to the right maxillary extraction socket. Upper chest: Scattered ground-glass and nodular  opacities in the right greater than left lung (series 3, images 130 and 139). No pleural effusion. Other: Asymmetric edema and fat stranding in the right lower face (series 6, image 39 and series 3, image 60). IMPRESSION: 1. Small area of low-density along the lateral aspect of the right mandible, adjacent to the extraction socket and subjacent to the inferior aspect of the masseter, which could represent phlegmon or developing abscess. This is adjacent to a small area of cortical thinning in the right mandible. 2. Asymmetric edema and fat stranding in the right lower face, consistent with cellulitis. 3. Scattered ground-glass and nodular opacities in the right greater than left lung, which are incompletely evaluated but concerning for multifocal pneumonia. These results will be called to the ordering clinician or representative by the Radiologist Assistant, and communication documented in the PACS or [employer]. Electronically Signed   By: Wiliam Ke M.D.   On: 09/30/2022 14:46   DG Chest Port 1 View  Result Date: 09/30/2022 CLINICAL DATA:  Diarrhea and vomiting. EXAM: PORTABLE CHEST 1 VIEW COMPARISON:  July 13, 2022 FINDINGS: The heart size and mediastinal contours are within normal limits. Both lungs are clear. The visualized skeletal structures are unremarkable. IMPRESSION: No active disease. Electronically Signed   By: Aram Candela M.D.   On: 09/30/2022 02:20    Microbiology: Results for orders placed or performed during the [facility] encounter of 09/30/22  Resp panel by RT-PCR (RSV, Flu A&B, Covid) Anterior Nasal Swab     Status: None   Collection Time: 09/30/22  1:57 AM   Specimen: Anterior Nasal Swab  Result Value Ref Range Status   SARS Coronavirus 2 by RT PCR NEGATIVE NEGATIVE Final    Comment: (NOTE) SARS-CoV-2 target nucleic acids are NOT DETECTED.  The SARS-CoV-2 RNA is generally detectable in upper respiratory specimens during the acute phase of infection. The  lowest concentration of SARS-CoV-2 viral copies this assay can detect is 138 copies/mL. A negative result does not preclude SARS-Cov-2 infection and should not be used as the sole basis for treatment or other patient management decisions. A negative result may occur with  improper specimen collection/handling, submission of specimen other than nasopharyngeal swab, presence of viral mutation(s) within the areas targeted by this assay, and inadequate number of viral copies(<138 copies/mL). A negative result must be combined with clinical observations, patient history, and epidemiological information. The expected result is Negative.  Fact Sheet for Patients:  BloggerCourse.com  Fact Sheet for Healthcare Providers:  SeriousBroker.it  This test is no t yet approved or cleared by  the [employer] and  has been authorized for detection and/or diagnosis of SARS-CoV-2 by FDA under an Emergency Use Authorization (EUA). This EUA will remain  in effect (meaning this test can be used) for the duration of the COVID-19 declaration under Section 564(b)(1) of the Act, 21 U.S.C.section 360bbb-3(b)(1), unless the authorization is terminated  or revoked sooner.       Influenza A by PCR NEGATIVE NEGATIVE Final   Influenza B by PCR NEGATIVE NEGATIVE Final    Comment: (NOTE) The Xpert Xpress SARS-CoV-2/FLU/RSV plus assay is intended as an aid in the diagnosis of influenza from Nasopharyngeal swab specimens and should not be used as a sole basis for treatment. Nasal washings and aspirates are unacceptable for Xpert Xpress SARS-CoV-2/FLU/RSV testing.  Fact Sheet for Patients: BloggerCourse.com  Fact Sheet for Healthcare Providers: SeriousBroker.it  This test is not yet approved or cleared by the [place] FDA and has been authorized for detection and/or diagnosis of SARS-CoV-2 by FDA under  an Emergency Use Authorization (EUA). This EUA will remain in effect (meaning this test can be used) for the duration of the COVID-19 declaration under Section 564(b)(1) of the Act, 21 U.S.C. section 360bbb-3(b)(1), unless the authorization is terminated or revoked.     Resp Syncytial Virus by PCR NEGATIVE NEGATIVE Final    Comment: (NOTE) Fact Sheet for Patients: BloggerCourse.com  Fact Sheet for Healthcare Providers: SeriousBroker.it  This test is not yet approved or cleared by the [place] FDA and has been authorized for detection and/or diagnosis of SARS-CoV-2 by FDA under an Emergency Use Authorization (EUA). This EUA will remain in effect (meaning this test can be used) for the duration of the COVID-19 declaration under Section 564(b)(1) of the Act, 21 U.S.C. section 360bbb-3(b)(1), unless the authorization is terminated or revoked.  Performed at [facility], [address] [place], [place] [address]   Blood Culture (routine x 2)     Status: Abnormal   Collection Time: 09/30/22  1:57 AM   Specimen: BLOOD  Result Value Ref Range Status   Specimen Description   Final    BLOOD BLOOD LEFT ARM Performed at [facility], [address], [place], [place] [address]    Special Requests   Final    BOTTLES DRAWN AEROBIC AND ANAEROBIC Blood Culture results may not be optimal due to an excessive volume of blood received in culture bottles Performed at [facility], [address], [place], [place] [address]    Culture  Setup Time   Final    IN BOTH AEROBIC AND ANAEROBIC BOTTLES GRAM POSITIVE COCCI CRITICAL VALUE NOTED.  VALUE IS CONSISTENT WITH PREVIOUSLY REPORTED AND CALLED VALUE.    Culture (A)  Final    STREPTOCOCCUS INTERMEDIUS SUSCEPTIBILITIES PERFORMED ON PREVIOUS CULTURE WITHIN THE LAST 5 DAYS. Performed at [facility], [address],  [place], [place] [address]    Report Status 10/04/2022 FINAL  Final  Blood Culture (routine x 2)     Status: Abnormal   Collection Time: 09/30/22  1:57 AM   Specimen: BLOOD RIGHT ARM  Result Value Ref Range Status   Specimen Description   Final    BLOOD RIGHT ARM Performed at [facility], [address], [place], [place] [address]    Special Requests   Final    BOTTLES DRAWN AEROBIC AND ANAEROBIC Blood Culture results may not be optimal due to an excessive volume of blood received in culture bottles Performed at [facility], [address], [place], [place] [address]    Culture  Setup Time   Final    GRAM POSITIVE COCCI IN BOTH AEROBIC AND ANAEROBIC BOTTLES CRITICAL RESULT CALLED TO, READ BACK BY AND VERIFIED WITH:  C/ PHARMD H. WILSON 10/01/22 0934 A. LAFRANCE Performed at [facility], [address] [place], [place] [address]    Culture STREPTOCOCCUS INTERMEDIUS (A)  Final   Report Status 10/04/2022 FINAL  Final   Organism ID, Bacteria STREPTOCOCCUS INTERMEDIUS  Final      Susceptibility   Streptococcus intermedius - MIC*    PENICILLIN <=0.06 SENSITIVE Sensitive     CEFTRIAXONE <=0.12 SENSITIVE Sensitive     LEVOFLOXACIN <=0.25 SENSITIVE Sensitive     VANCOMYCIN 0.25 SENSITIVE Sensitive     * STREPTOCOCCUS INTERMEDIUS  Blood Culture ID Panel (Reflexed)     Status: Abnormal   Collection Time: 09/30/22  1:57 AM  Result Value Ref Range Status   Enterococcus faecalis NOT DETECTED NOT DETECTED Final   Enterococcus Faecium NOT DETECTED NOT DETECTED Final   Listeria monocytogenes NOT DETECTED NOT DETECTED Final   Staphylococcus species NOT DETECTED NOT DETECTED Final   Staphylococcus aureus (BCID) NOT DETECTED NOT DETECTED Final   Staphylococcus epidermidis NOT DETECTED NOT DETECTED Final   Staphylococcus lugdunensis NOT DETECTED NOT DETECTED Final   Streptococcus species DETECTED (A) NOT DETECTED Final    Comment: Not Enterococcus species,  Streptococcus agalactiae, Streptococcus pyogenes, or Streptococcus pneumoniae. CRITICAL RESULT CALLED TO, READ BACK BY AND VERIFIED WITH:  C/ PHARMD H. WILSON 10/01/22 0934 A. LAFRANCE    Streptococcus agalactiae NOT DETECTED NOT DETECTED Final   Streptococcus pneumoniae NOT DETECTED NOT DETECTED Final   Streptococcus pyogenes NOT DETECTED NOT DETECTED Final   A.calcoaceticus-baumannii NOT DETECTED NOT DETECTED Final   Bacteroides fragilis NOT DETECTED NOT DETECTED Final   Enterobacterales NOT DETECTED NOT DETECTED Final   Enterobacter cloacae complex NOT DETECTED NOT DETECTED Final   Escherichia coli NOT DETECTED NOT DETECTED Final   Klebsiella aerogenes NOT DETECTED NOT DETECTED Final   Klebsiella oxytoca NOT DETECTED NOT DETECTED Final   Klebsiella pneumoniae NOT DETECTED NOT DETECTED Final   Proteus species NOT DETECTED NOT DETECTED Final   Salmonella species NOT DETECTED NOT DETECTED Final   Serratia marcescens NOT DETECTED NOT DETECTED Final   Haemophilus influenzae NOT DETECTED NOT DETECTED Final   Neisseria meningitidis NOT DETECTED NOT DETECTED Final   Pseudomonas aeruginosa NOT DETECTED NOT DETECTED Final   Stenotrophomonas maltophilia NOT DETECTED NOT DETECTED Final   Candida albicans NOT DETECTED NOT DETECTED Final   Candida auris NOT DETECTED NOT DETECTED Final   Candida glabrata NOT DETECTED NOT DETECTED Final   Candida krusei NOT DETECTED NOT DETECTED Final   Candida parapsilosis NOT DETECTED NOT DETECTED Final   Candida tropicalis NOT DETECTED NOT DETECTED Final   Cryptococcus neoformans/gattii NOT DETECTED NOT DETECTED Final    Comment: Performed at [facility], [address] [place], [place] [address]  MRSA Next Gen by PCR, Nasal     Status: None   Collection Time: 09/30/22  5:30 PM   Specimen: Nasal Mucosa; Nasal Swab  Result Value Ref Range Status   MRSA by PCR Next Gen NOT DETECTED NOT DETECTED Final    Comment: (NOTE) The GeneXpert MRSA Assay (FDA  approved for NASAL specimens only), is one component of a comprehensive MRSA colonization surveillance program. It is not intended to diagnose MRSA infection nor to guide or monitor treatment  for MRSA infections. Test performance is not FDA approved in patients less than 82 years old. Performed at [facility], [address], [place], [place] [address]   Culture, blood (Routine X 2) w Reflex to ID Panel     Status: None (Preliminary result)   Collection Time: 10/02/22 10:10 AM   Specimen: BLOOD LEFT HAND  Result Value Ref Range Status   Specimen Description BLOOD LEFT HAND  Final   Special Requests   Final    BOTTLES DRAWN AEROBIC AND ANAEROBIC Blood Culture adequate volume   Culture   Final    NO GROWTH 2 DAYS Performed at [facility], [address], [place], [place] [address]    Report Status PENDING  Incomplete  Culture, blood (Routine X 2) w Reflex to ID Panel     Status: None (Preliminary result)   Collection Time: 10/02/22 10:17 AM   Specimen: BLOOD LEFT ARM  Result Value Ref Range Status   Specimen Description BLOOD LEFT ARM  Final   Special Requests   Final    BOTTLES DRAWN AEROBIC AND ANAEROBIC Blood Culture adequate volume   Culture   Final    NO GROWTH 2 DAYS Performed at [facility], [address], [place], [place] [address]    Report Status PENDING  Incomplete    Labs: CBC: Recent Labs  Lab 09/30/22 0157 09/30/22 1003 09/30/22 1501 10/01/22 0046 10/02/22 0046  WBC 20.1*  --   --  14.3* 17.8*  NEUTROABS 17.6*  --   --   --   --   HGB 11.7* 10.6* 9.8* 11.3* 10.8*  HCT 34.7* 31.0* 29.4* 32.8* 31.7*  MCV 71.5*  --   --  71.0* 71.9*  PLT 329  --   --  290 350   Basic Metabolic Panel: Recent Labs  Lab 09/30/22 1003 10/01/22 0046 10/01/22 1751 10/02/22 0046 10/03/22 0037 10/04/22 0118  NA 136 133*  --  138 137 137  K 3.5 3.4*  --  3.8 3.4* 3.1*  CL 100 102  --  105 105 103  CO2 26 23  --  24 24 27   GLUCOSE 111* 149*  --  138*  100* 124*  BUN 12 12  --  19 19 15   CREATININE 1.42* 1.36*  --  1.26* 1.26* 1.37*  CALCIUM 8.8* 8.8*  --  9.2 9.0 8.4*  MG  --   --  1.9  --   --   --    Liver Function Tests: Recent Labs  Lab 09/30/22 0157  AST 32  ALT 33  ALKPHOS 64  BILITOT 0.6  PROT 7.6  ALBUMIN 3.8   CBG: No results for input(s): "GLUCAP" in the last 168 hours.  Discharge time spent: greater than 30 minutes.  This record has been created using Conservation officer, historic buildings. Errors have been sought and corrected,but may not always be located. Such creation errors do not reflect on the standard of care.   Signed: Arnetha Courser, MD [facility] 10/04/2022

## 2022-10-04 NOTE — Interval H&P Note (Signed)
History and Physical Interval Note:  10/04/2022 10:03 AM  Darren [facility]  has presented today for surgery, with the diagnosis of bacteremia.  The various methods of treatment have been discussed with the patient and family. After consideration of risks, benefits and other options for treatment, the patient has consented to  Procedure(s): TRANSESOPHAGEAL ECHOCARDIOGRAM (N/A) as a surgical intervention.  The patient's history has been reviewed, patient examined, no change in status, stable for surgery.  I have reviewed the patient's chart and labs.  Questions were answered to the patient's satisfaction.     [name]

## 2022-10-05 ENCOUNTER — Encounter (HOSPITAL_COMMUNITY): Payer: Self-pay | Admitting: Cardiovascular Disease

## 2022-10-05 ENCOUNTER — Telehealth: Payer: Self-pay

## 2022-10-05 LAB — CULTURE, BLOOD (ROUTINE X 2): Culture: NO GROWTH

## 2022-10-05 NOTE — Transitions of Care (Post Inpatient/ED Visit) (Signed)
10/05/2022  Name: Ransome Helwig II MRN: [id number] DOB: [birth year]  Today's TOC FU Call Status: Today's TOC FU Call Status:: Successful TOC FU Call Competed  Transition Care Management Follow-up Telephone Call Discharge Facility: [facility]) Type of Discharge: Inpatient Admission Primary Inpatient Discharge Diagnosis:: sepsis How have you been since you were released from the hospital?: Better Any questions or concerns?: No  Items Reviewed: Did you receive and understand the discharge instructions provided?: Yes Medications obtained,verified, and reconciled?: Yes (Medications Reviewed) Any new allergies since your discharge?: No Dietary orders reviewed?: No Do you have support at home?: Yes People in Home: spouse, parent(s)  Medications Reviewed Today: Medications Reviewed Today     Reviewed by Annabell Sabal, CMA (Certified Medical Assistant) on 10/05/22 at [id number]  Med List Status: <None>   Medication Order Taking? Sig Documenting Provider Last Dose Status Informant  acetaminophen (TYLENOL) 500 MG tablet [id number] Yes Take 500 mg by mouth 2 (two) times daily as needed for moderate pain. [provider] Taking Active Self, Pharmacy Records  amoxicillin-clavulanate (AUGMENTIN) 875-125 MG tablet [id number] Yes Take 1 tablet by mouth every 12 (twelve) hours for 26 days. Stop date 10/30/22 Arnetha Courser, MD Taking Active   cetirizine (ZYRTEC) 10 MG tablet [id number] Yes Take 10 mg by mouth daily as needed for allergies. [provider] Taking Active Self, Pharmacy Records  clobetasol (TEMOVATE) 0.05 % GEL [id number] Yes Apply 1 Application topically as needed. [provider] Taking Active Self, Pharmacy Records  dextromethorphan-guaiFENesin [facility] DM) 30-600 MG 12hr tablet [id number] Yes Take 1 tablet by mouth daily as needed for cough. [provider] Taking Active Self, Pharmacy Records  ferrous sulfate 325 (65 FE) MG tablet [id number] Yes Take 1  tablet (325 mg total) by mouth daily with breakfast. Arnetha Courser, MD Taking Active   flurbiprofen (ANSAID) 100 MG tablet [id number] Yes Take 100 mg by mouth 2 (two) times daily. [provider] Taking Active Self, Pharmacy Records  ibuprofen (ADVIL) 800 MG tablet [id number] Yes Take 800 mg by mouth 2 (two) times daily as needed for moderate pain. [provider] Taking Active Self, Pharmacy Records  inFLIXimab (REMICADE) 100 MG injection [id number] Yes Inject into the skin every 6 (six) weeks. [provider] Taking Active Self, Pharmacy Records           Med Note (CRUTHIS, CHLOE C   Sat Sep 30, 2022 11:41 AM) Pt is unsure how much his PCP is injecting each dose.   pantoprazole (PROTONIX) 40 MG tablet [id number] Yes Take 1 tablet (40 mg total) by mouth daily. Arnetha Courser, MD Taking Active             Home Care and Equipment/Supplies: Were Home Health Services Ordered?: No Any new equipment or medical supplies ordered?: No  Functional Questionnaire: Do you need assistance with bathing/showering or dressing?: No Do you need assistance with meal preparation?: No Do you need assistance with eating?: No Do you have difficulty maintaining continence: No Do you need assistance with getting out of bed/getting out of a chair/moving?: No Do you have difficulty managing or taking your medications?: No  Follow up appointments reviewed: PCP Follow-up appointment confirmed?: Yes MD Provider Line Number:[phone] Given: Yes Date of PCP follow-up appointment?: 10/11/22 Follow-up Provider: sagardia Specialist [facility] Follow-up appointment confirmed?: Yes Date of Specialist follow-up appointment?: 10/10/22 Follow-Up Specialty Provider:: oral sugsery Do you need transportation to your follow-up appointment?: No Do you understand care options if your condition(s) worsen?: Yes-patient verbalized understanding  SIGNATURE Fredirick Maudlin [facility]

## 2022-10-06 LAB — CULTURE, BLOOD (ROUTINE X 2): Special Requests: ADEQUATE

## 2022-10-07 LAB — CULTURE, BLOOD (ROUTINE X 2)

## 2022-10-11 ENCOUNTER — Encounter: Payer: Self-pay | Admitting: Emergency Medicine

## 2022-10-11 ENCOUNTER — Ambulatory Visit: Payer: BC Managed Care – PPO | Admitting: Emergency Medicine

## 2022-10-11 VITALS — BP 124/78 | HR 90 | Temp 98.1°F | Ht 67.0 in | Wt 197.2 lb

## 2022-10-11 DIAGNOSIS — Z09 Encounter for follow-up examination after completed treatment for conditions other than malignant neoplasm: Secondary | ICD-10-CM

## 2022-10-11 DIAGNOSIS — A419 Sepsis, unspecified organism: Secondary | ICD-10-CM

## 2022-10-11 DIAGNOSIS — R195 Other fecal abnormalities: Secondary | ICD-10-CM | POA: Diagnosis not present

## 2022-10-11 DIAGNOSIS — N179 Acute kidney failure, unspecified: Secondary | ICD-10-CM

## 2022-10-11 LAB — COMPREHENSIVE METABOLIC PANEL
ALT: 73 U/L — ABNORMAL HIGH (ref 0–53)
AST: 30 U/L (ref 0–37)
Albumin: 4.1 g/dL (ref 3.5–5.2)
Alkaline Phosphatase: 61 U/L (ref 39–117)
BUN: 14 mg/dL (ref 6–23)
CO2: 30 mEq/L (ref 19–32)
Calcium: 9.8 mg/dL (ref 8.4–10.5)
Chloride: 101 mEq/L (ref 96–112)
Creatinine, Ser: 1.01 mg/dL (ref 0.40–1.50)
GFR: 95.91 mL/min (ref >60.00)
Glucose, Bld: 99 mg/dL (ref 70–99)
Potassium: 4.3 mEq/L (ref 3.5–5.1)
Sodium: 138 mEq/L (ref 135–145)
Total Bilirubin: 0.3 mg/dL (ref 0.2–1.2)
Total Protein: 8 g/dL (ref 6.0–8.3)

## 2022-10-11 LAB — CBC WITH DIFFERENTIAL/PLATELET
Basophils Absolute: 0 10*3/uL (ref 0.0–0.1)
Basophils Relative: 0.4 % (ref 0.0–3.0)
Eosinophils Absolute: 0.1 10*3/uL (ref 0.0–0.7)
Eosinophils Relative: 1 % (ref 0.0–5.0)
HCT: 36.8 % — ABNORMAL LOW (ref 39.0–52.0)
Hemoglobin: 12.4 g/dL — ABNORMAL LOW (ref 13.0–17.0)
Lymphocytes Relative: 24.1 % (ref 12.0–46.0)
Lymphs Abs: 2.6 10*3/uL (ref 0.7–4.0)
MCHC: 33.8 g/dL (ref 30.0–36.0)
MCV: 73.1 fl — ABNORMAL LOW (ref 78.0–100.0)
Monocytes Absolute: 0.7 10*3/uL (ref 0.1–1.0)
Monocytes Relative: 6.4 % (ref 3.0–12.0)
Neutro Abs: 7.4 10*3/uL (ref 1.4–7.7)
Neutrophils Relative %: 68.1 % (ref 43.0–77.0)
Platelets: 610 10*3/uL — ABNORMAL HIGH (ref 150.0–400.0)
RBC: 5.04 Mil/uL (ref 4.22–5.81)
RDW: 15.5 % (ref 11.5–15.5)
WBC: 10.9 10*3/uL — ABNORMAL HIGH (ref 4.0–10.5)

## 2022-10-11 NOTE — Assessment & Plan Note (Signed)
Much improved. Hemodynamically stable. Asymptomatic. Continues Augmentin 875 mg twice a day to complete 21 days

## 2022-10-11 NOTE — Patient Instructions (Signed)
Health Maintenance, Male Adopting a healthy lifestyle and getting preventive care are important in promoting health and wellness. Ask your health care provider about: The right schedule for you to have regular tests and exams. Things you can do on your own to prevent diseases and keep yourself healthy. What should I know about diet, weight, and exercise? Eat a healthy diet  Eat a diet that includes plenty of vegetables, fruits, low-fat dairy products, and lean protein. Do not eat a lot of foods that are high in solid fats, added sugars, or sodium. Maintain a healthy weight Body mass index (BMI) is a measurement that can be used to identify possible weight problems. It estimates body fat based on height and weight. Your health care provider can help determine your BMI and help you achieve or maintain a healthy weight. Get regular exercise Get regular exercise. This is one of the most important things you can do for your health. Most adults should: Exercise for at least 150 minutes each week. The exercise should increase your heart rate and make you sweat (moderate-intensity exercise). Do strengthening exercises at least twice a week. This is in addition to the moderate-intensity exercise. Spend less time sitting. Even light physical activity can be beneficial. Watch cholesterol and blood lipids Have your blood tested for lipids and cholesterol at 36 years of age, then have this test every 5 years. You may need to have your cholesterol levels checked more often if: Your lipid or cholesterol levels are high. You are older than 36 years of age. You are at high risk for heart disease. What should I know about cancer screening? Many types of cancers can be detected early and may often be prevented. Depending on your health history and family history, you may need to have cancer screening at various ages. This may include screening for: Colorectal cancer. Prostate cancer. Skin cancer. Lung  cancer. What should I know about heart disease, diabetes, and high blood pressure? Blood pressure and heart disease High blood pressure causes heart disease and increases the risk of stroke. This is more likely to develop in people who have high blood pressure readings or are overweight. Talk with your health care provider about your target blood pressure readings. Have your blood pressure checked: Every 3-5 years if you are 18-39 years of age. Every year if you are 40 years old or older. If you are between the ages of 65 and 75 and are a current or former smoker, ask your health care provider if you should have a one-time screening for abdominal aortic aneurysm (AAA). Diabetes Have regular diabetes screenings. This checks your fasting blood sugar level. Have the screening done: Once every three years after age 45 if you are at a normal weight and have a low risk for diabetes. More often and at a younger age if you are overweight or have a high risk for diabetes. What should I know about preventing infection? Hepatitis B If you have a higher risk for hepatitis B, you should be screened for this virus. Talk with your health care provider to find out if you are at risk for hepatitis B infection. Hepatitis C Blood testing is recommended for: Everyone born from 1945 through 1965. Anyone with known risk factors for hepatitis C. Sexually transmitted infections (STIs) You should be screened each year for STIs, including gonorrhea and chlamydia, if: You are sexually active and are younger than 36 years of age. You are older than 36 years of age and your   health care provider tells you that you are at risk for this type of infection. Your sexual activity has changed since you were last screened, and you are at increased risk for chlamydia or gonorrhea. Ask your health care provider if you are at risk. Ask your health care provider about whether you are at high risk for HIV. Your health care provider  may recommend a prescription medicine to help prevent HIV infection. If you choose to take medicine to prevent HIV, you should first get tested for HIV. You should then be tested every 3 months for as long as you are taking the medicine. Follow these instructions at home: Alcohol use Do not drink alcohol if your health care provider tells you not to drink. If you drink alcohol: Limit how much you have to 0-2 drinks a day. Know how much alcohol is in your drink. In the [place] one drink equals one 12 oz bottle of beer (355 mL), one 5 oz glass of wine (148 mL), or one 1 oz glass of hard liquor (44 mL). Lifestyle Do not use any products that contain nicotine or tobacco. These products include cigarettes, chewing tobacco, and vaping devices, such as e-cigarettes. If you need help quitting, ask your health care provider. Do not use street drugs. Do not share needles. Ask your health care provider for help if you need support or information about quitting drugs. General instructions Schedule regular health, dental, and eye exams. Stay current with your vaccines. Tell your health care provider if: You often feel depressed. You have ever been abused or do not feel safe at home. Summary Adopting a healthy lifestyle and getting preventive care are important in promoting health and wellness. Follow your health care provider's instructions about healthy diet, exercising, and getting tested or screened for diseases. Follow your health care provider's instructions on monitoring your cholesterol and blood pressure. This information is not intended to replace advice given to you by your health care provider. Make sure you discuss any questions you have with your health care provider. Document Revised: 10/11/2020 Document Reviewed: 10/11/2020 Elsevier Patient Education  2023 [employer]  

## 2022-10-11 NOTE — Assessment & Plan Note (Signed)
CMP repeated today Normal vital signs Urinating well Drinking and eating well.  No nausea or vomiting

## 2022-10-11 NOTE — Progress Notes (Signed)
Darren Ramirez 37 y.o.   Chief Complaint  Patient presents with   Hospitalization Follow-up    D/c form hosp on 5/1, Sepsis, patient states she feels fine, still sore from the surgery  Labs to check kidney function    Referral    Patient needs a referral to GI for positive FOBT     HISTORY OF PRESENT ILLNESS: This is a 37 y.o. male A1A here for hospital discharge follow-up Admitted on 09/30/2022 and discharged on 10/04/2022 Admitted with bacteremia/sepsis secondary to periodontal abscess which developed secondary to wisdom tooth removal Hospital discharge summary as follows: Physician Discharge Summary    Patient: Darren Ramirez MRN: [id number] DOB: [birth year]  Admit date:     09/30/2022  Discharge date: 10/04/22  Discharge Physician: Arnetha Courser    PCP: Georgina Quint, MD    Recommendations at discharge:  Please obtain CBC and BMP within a week Patient was having intermittent elevation of blood pressure, please monitor and start medication as appropriate Patient was also found to have positive FOBT and will need outpatient gastroenterology appointment for further investigation. He is being discharged on 26 more days of Augmentin for Streptococcus intermedius bacteremia. Please follow-up with oral surgery Follow-up with primary care provider within a week   Discharge Diagnoses: Principal Problem:   Bacteremia due to Streptococcus Active Problems:   Sepsis due to undetermined organism (HCC)   Odontogenic infection of jaw   AKI (acute kidney injury) (HCC)   Abnormal urinalysis   Microcytic hypochromic anemia   Psoriatic arthritis (HCC)   HYPERCHOLESTEROLEMIA   Obesity (BMI 30-39.9)   Hypokalemia   Sepsis (HCC)  Feeling much better.  Has no complaints or any other medical concerns today.  HPI   Prior to Admission medications   Medication Sig Start Date End Date Taking? Authorizing Provider  acetaminophen (TYLENOL) 500 MG tablet Take 500 mg by mouth 2  (two) times daily as needed for moderate pain.   Yes [provider]  amoxicillin-clavulanate (AUGMENTIN) 875-125 MG tablet Take 1 tablet by mouth every 12 (twelve) hours for 26 days. Stop date 10/30/22 10/04/22 10/30/22 Yes Arnetha Courser, MD  cetirizine (ZYRTEC) 10 MG tablet Take 10 mg by mouth daily as needed for allergies.   Yes [provider]  clobetasol (TEMOVATE) 0.05 % GEL Apply 1 Application topically as needed.   Yes [provider]  ferrous sulfate 325 (65 FE) MG tablet Take 1 tablet (325 mg total) by mouth daily with breakfast. 10/05/22  Yes Arnetha Courser, MD  flurbiprofen (ANSAID) 100 MG tablet Take 100 mg by mouth 2 (two) times daily. 09/25/22  Yes [provider]  ibuprofen (ADVIL) 800 MG tablet Take 800 mg by mouth 2 (two) times daily as needed for moderate pain.   Yes [provider]  inFLIXimab (REMICADE) 100 MG injection Inject into the skin every 6 (six) weeks.   Yes [provider]  pantoprazole (PROTONIX) 40 MG tablet Take 1 tablet (40 mg total) by mouth daily. 10/04/22  Yes Arnetha Courser, MD    Allergies  Allergen Reactions   Amoxicillin Other (See Comments)    Mouth sores/thrush     Patient Active Problem List   Diagnosis Date Noted   Sepsis (HCC) 10/03/2022   Bacteremia due to Streptococcus 10/02/2022   Hypokalemia 10/01/2022   Sepsis due to undetermined organism (HCC) 09/30/2022   Odontogenic infection of jaw 09/30/2022   Abnormal urinalysis 09/30/2022   AKI (acute kidney injury) (HCC) 09/30/2022   Microcytic  hypochromic anemia 09/30/2022   Obesity (BMI 30-39.9) 09/30/2022   Acute tonsillitis 07/25/2022   Flu-like symptoms 07/25/2022   Seizure disorder (HCC) 03/08/2021   Cervical radiculopathy 11/23/2017   HYPERCHOLESTEROLEMIA 11/20/2008   Psoriatic arthritis (HCC) 11/20/2008    Past Medical History:  Diagnosis Date   Anemia    Arthralgia    Seasonal allergies    Tinea pedis     Past Surgical History:   Procedure Laterality Date   TEE WITHOUT CARDIOVERSION N/A 10/04/2022   Procedure: TRANSESOPHAGEAL ECHOCARDIOGRAM;  Surgeon: Thurmon Fair, MD;  Location: [facility];  Service: Cardiovascular;  Laterality: N/A;    Social History   Socioeconomic History   Marital status: Single    Spouse name: Not on file   Number of children: Not on file   Years of education: Not on file   Highest education level: Not on file  Occupational History   Occupation: Holiday representative  Tobacco Use   Smoking status: Former    Types: Cigars    Quit date: 06/05/2010    Years since quitting: 12.3   Smokeless tobacco: Never  Substance and Sexual Activity   Alcohol use: Yes    Comment: 1-2 times per month   Drug use: No   Sexual activity: Not on file  Other Topics Concern   Not on file  Social History Narrative   Not on file   Social Determinants of Health   Financial Resource Strain: Not on file  Food Insecurity: No Food Insecurity (10/01/2022)   Hunger Vital Sign    Worried About Running Out of Food in the Last Year: Never true    Ran Out of Food in the Last Year: Never true  Transportation Needs: No Transportation Needs (10/01/2022)   PRAPARE - Administrator, Civil Service (Medical): No    Lack of Transportation (Non-Medical): No  Physical Activity: Not on file  Stress: Not on file  Social Connections: Not on file  Intimate Partner Violence: Not At Risk (10/01/2022)   Humiliation, Afraid, Rape, and Kick questionnaire    Fear of Current or Ex-Partner: No    Emotionally Abused: No    Physically Abused: No    Sexually Abused: No    Family History  Problem Relation Age of Onset   Hyperlipidemia Father    Sleep apnea Father    Thyroid disease Father      Review of Systems  Constitutional: Negative.  Negative for chills and fever.  HENT: Negative.  Negative for congestion and sore throat.   Respiratory: Negative.  Negative for cough and shortness of breath.    Cardiovascular: Negative.  Negative for chest pain and palpitations.  Gastrointestinal:  Negative for abdominal pain, diarrhea, nausea and vomiting.  Genitourinary: Negative.  Negative for dysuria and hematuria.  Skin: Negative.  Negative for rash.  Neurological: Negative.  Negative for dizziness and headaches.  All other systems reviewed and are negative.   Vitals:   10/11/22 0927  BP: 124/78  Pulse: 90  Temp: 98.1 F (36.7 C)  SpO2: 96%    Physical Exam Vitals reviewed.  Constitutional:      Appearance: Normal appearance.  HENT:     Head: Normocephalic.     Mouth/Throat:     Mouth: Mucous membranes are moist.     Pharynx: Oropharynx is clear.  Eyes:     Extraocular Movements: Extraocular movements intact.     Conjunctiva/sclera: Conjunctivae normal.     Pupils: Pupils are equal, round, and  reactive to light.  Cardiovascular:     Rate and Rhythm: Normal rate and regular rhythm.     Pulses: Normal pulses.     Heart sounds: Normal heart sounds.  Pulmonary:     Effort: Pulmonary effort is normal.     Breath sounds: Normal breath sounds.  Abdominal:     Palpations: Abdomen is soft.     Tenderness: There is no abdominal tenderness.  Musculoskeletal:     Cervical back: No tenderness.  Lymphadenopathy:     Cervical: No cervical adenopathy.  Skin:    General: Skin is warm and dry.  Neurological:     General: No focal deficit present.     Mental Status: He is alert and oriented to person, place, and time.  Psychiatric:        Mood and Affect: Mood normal.        Behavior: Behavior normal.      ASSESSMENT & PLAN: A total of 48 minutes was spent with the patient and counseling/coordination of care regarding preparing for this visit, review of most recent office visit notes, review of most recent hospital discharge summary notes, review of most recent blood work results, review of all medications, prognosis, documentation, and need for follow-up.  Problem List Items  Addressed This Visit       Genitourinary   AKI (acute kidney injury) (HCC)    CMP repeated today Normal vital signs Urinating well Drinking and eating well.  No nausea or vomiting      Relevant Orders   Comprehensive metabolic panel     Other   Sepsis due to undetermined organism [facility]) - Primary    Much improved. Hemodynamically stable. Asymptomatic. Continues Augmentin 875 mg twice a day to complete 21 days      Relevant Orders   CBC with Differential/Platelet   Other Visit Diagnoses     Hospital discharge follow-up       Occult GI bleeding       Relevant Orders   CBC with Differential/Platelet   Ambulatory referral to Gastroenterology      Patient Instructions  Health Maintenance, Male Adopting a healthy lifestyle and getting preventive care are important in promoting health and wellness. Ask your health care provider about: The right schedule for you to have regular tests and exams. Things you can do on your own to prevent diseases and keep yourself healthy. What should I know about diet, weight, and exercise? Eat a healthy diet  Eat a diet that includes plenty of vegetables, fruits, low-fat dairy products, and lean protein. Do not eat a lot of foods that are high in solid fats, added sugars, or sodium. Maintain a healthy weight Body mass index (BMI) is a measurement that can be used to identify possible weight problems. It estimates body fat based on height and weight. Your health care provider can help determine your BMI and help you achieve or maintain a healthy weight. Get regular exercise Get regular exercise. This is one of the most important things you can do for your health. Most adults should: Exercise for at least 150 minutes each week. The exercise should increase your heart rate and make you sweat (moderate-intensity exercise). Do strengthening exercises at least twice a week. This is in addition to the moderate-intensity exercise. Spend less time  sitting. Even light physical activity can be beneficial. Watch cholesterol and blood lipids Have your blood tested for lipids and cholesterol at 36 years of age, then have this test every 5  years. You may need to have your cholesterol levels checked more often if: Your lipid or cholesterol levels are high. You are older than 36 years of age. You are at high risk for heart disease. What should I know about cancer screening? Many types of cancers can be detected early and may often be prevented. Depending on your health history and family history, you may need to have cancer screening at various ages. This may include screening for: Colorectal cancer. Prostate cancer. Skin cancer. Lung cancer. What should I know about heart disease, diabetes, and high blood pressure? Blood pressure and heart disease High blood pressure causes heart disease and increases the risk of stroke. This is more likely to develop in people who have high blood pressure readings or are overweight. Talk with your health care provider about your target blood pressure readings. Have your blood pressure checked: Every 3-5 years if you are 33-58 years of age. Every year if you are 38 years old or older. If you are between the ages of 9 and 24 and are a current or former smoker, ask your health care provider if you should have a one-time screening for abdominal aortic aneurysm (AAA). Diabetes Have regular diabetes screenings. This checks your fasting blood sugar level. Have the screening done: Once every three years after age 25 if you are at a normal weight and have a low risk for diabetes. More often and at a younger age if you are overweight or have a high risk for diabetes. What should I know about preventing infection? Hepatitis B If you have a higher risk for hepatitis B, you should be screened for this virus. Talk with your health care provider to find out if you are at risk for hepatitis B infection. Hepatitis  C Blood testing is recommended for: Everyone born from [birth year] through 1964. Anyone with known risk factors for hepatitis C. Sexually transmitted infections (STIs) You should be screened each year for STIs, including gonorrhea and chlamydia, if: You are sexually active and are younger than 36 years of age. You are older than 36 years of age and your health care provider tells you that you are at risk for this type of infection. Your sexual activity has changed since you were last screened, and you are at increased risk for chlamydia or gonorrhea. Ask your health care provider if you are at risk. Ask your health care provider about whether you are at high risk for HIV. Your health care provider may recommend a prescription medicine to help prevent HIV infection. If you choose to take medicine to prevent HIV, you should first get tested for HIV. You should then be tested every 3 months for as long as you are taking the medicine. Follow these instructions at home: Alcohol use Do not drink alcohol if your health care provider tells you not to drink. If you drink alcohol: Limit how much you have to 0-2 drinks a day. Know how much alcohol is in your drink. In the [place] one drink equals one 12 oz bottle of beer (355 mL), one 5 oz glass of wine (148 mL), or one 1 oz glass of hard liquor (44 mL). Lifestyle Do not use any products that contain nicotine or tobacco. These products include cigarettes, chewing tobacco, and vaping devices, such as e-cigarettes. If you need help quitting, ask your health care provider. Do not use street drugs. Do not share needles. Ask your health care provider for help if you need support or information about  quitting drugs. General instructions Schedule regular health, dental, and eye exams. Stay current with your vaccines. Tell your health care provider if: You often feel depressed. You have ever been abused or do not feel safe at home. Summary Adopting a healthy  lifestyle and getting preventive care are important in promoting health and wellness. Follow your health care provider's instructions about healthy diet, exercising, and getting tested or screened for diseases. Follow your health care provider's instructions on monitoring your cholesterol and blood pressure. This information is not intended to replace advice given to you by your health care provider. Make sure you discuss any questions you have with your health care provider. Document Revised: 10/11/2020 Document Reviewed: 10/11/2020 Elsevier Patient Education  2023 Elsevier Inc.    Edwina Barth, MD [facility]

## 2022-10-19 ENCOUNTER — Ambulatory Visit: Payer: BC Managed Care – PPO | Admitting: Internal Medicine

## 2022-10-26 ENCOUNTER — Encounter: Payer: Self-pay | Admitting: Gastroenterology

## 2022-11-08 ENCOUNTER — Ambulatory Visit: Payer: BC Managed Care – PPO | Admitting: Emergency Medicine

## 2022-12-20 ENCOUNTER — Telehealth: Payer: Self-pay

## 2022-12-20 NOTE — Telephone Encounter (Signed)
Pt has already seen PCP for ED issue

## 2023-01-23 ENCOUNTER — Ambulatory Visit (INDEPENDENT_AMBULATORY_CARE_PROVIDER_SITE_OTHER): Payer: BC Managed Care – PPO | Admitting: Gastroenterology

## 2023-01-23 ENCOUNTER — Other Ambulatory Visit (INDEPENDENT_AMBULATORY_CARE_PROVIDER_SITE_OTHER): Payer: BC Managed Care – PPO

## 2023-01-23 ENCOUNTER — Encounter: Payer: Self-pay | Admitting: Gastroenterology

## 2023-01-23 VITALS — BP 122/82 | Ht 67.0 in | Wt 218.0 lb

## 2023-01-23 DIAGNOSIS — D84821 Immunodeficiency due to drugs: Secondary | ICD-10-CM

## 2023-01-23 DIAGNOSIS — R195 Other fecal abnormalities: Secondary | ICD-10-CM

## 2023-01-23 DIAGNOSIS — D509 Iron deficiency anemia, unspecified: Secondary | ICD-10-CM

## 2023-01-23 DIAGNOSIS — Z79899 Other long term (current) drug therapy: Secondary | ICD-10-CM | POA: Diagnosis not present

## 2023-01-23 LAB — IBC + FERRITIN
Ferritin: 37.5 ng/mL (ref 22.0–322.0)
Iron: 84 ug/dL (ref 42–165)
Saturation Ratios: 22.2 % (ref 20.0–50.0)
TIBC: 378 ug/dL (ref 250.0–450.0)
Transferrin: 270 mg/dL (ref 212.0–360.0)

## 2023-01-23 LAB — CBC
HCT: 43.1 % (ref 39.0–52.0)
Hemoglobin: 14.3 g/dL (ref 13.0–17.0)
MCHC: 33.3 g/dL (ref 30.0–36.0)
MCV: 72.2 fl — ABNORMAL LOW (ref 78.0–100.0)
Platelets: 324 10*3/uL (ref 150.0–400.0)
RBC: 5.96 Mil/uL — ABNORMAL HIGH (ref 4.22–5.81)
RDW: 14.9 % (ref 11.5–15.5)
WBC: 8.6 10*3/uL (ref 4.0–10.5)

## 2023-01-23 MED ORDER — NA SULFATE-K SULFATE-MG SULF 17.5-3.13-1.6 GM/177ML PO SOLN: 1.0000 | Freq: Once | ORAL | 0 refills | Status: AC

## 2023-01-23 NOTE — Patient Instructions (Signed)
Your provider has requested that you go to the basement level for lab work before leaving today. Press "B" on the elevator. The lab is located at the first door on the left as you exit the elevator.  You have been scheduled for an endoscopy and colonoscopy. Please follow the written instructions given to you at your visit today.  Please pick up your prep supplies at the pharmacy within the next 1-3 days.  If you use inhalers (even only as needed), please bring them with you on the day of your procedure.  DO NOT TAKE 7 DAYS PRIOR TO TEST- Trulicity (dulaglutide) Ozempic, Wegovy (semaglutide) Mounjaro (tirzepatide) Bydureon Bcise (exanatide extended release)  DO NOT TAKE 1 DAY PRIOR TO YOUR TEST Rybelsus (semaglutide) Adlyxin (lixisenatide) Victoza (liraglutide) Byetta (exanatide) ___________________________________________________________________________   Due to recent changes in healthcare laws, you may see the results of your imaging and laboratory studies on MyChart before your provider has had a chance to review them.  We understand that in some cases there may be results that are confusing or concerning to you. Not all laboratory results come back in the same time frame and the provider may be waiting for multiple results in order to interpret others.  Please give [place] 48 hours in order for your provider to thoroughly review all the results before contacting the office for clarification of your results.     Thank you for choosing me and [facility].  Vito Cirigliano, D.O.

## 2023-01-23 NOTE — Progress Notes (Signed)
Chief Complaint: Iron deficiency anemia, heme positive stool  Referring Provider:     Georgina Quint, MD   HPI:     Darren Ramirez is a 37 y.o. male with a history of psoriasis (on Remicade), anemia, referred to the Gastroenterology Clinic for evaluation of iron deficiency anemia and heme positive stool.  Hospital admission 4/27 - 5/1 with sepsis 2/2 odontogenic infection of the jaw with abscess presumably after recent wisdom tooth extraction.  Blood cultures with Streptococcus intermedius (PCN sensitive).  Underwent TTE, TEE (no infective endocarditis).  Treated IV ABX and transition to Augmentin x 21 days.  Inpatient w/u was also notable for IDA with iron 8, TIBC 189, sat 4%. FOBT+.   1 week after hospital d/c, H/H was 12.4/36.8 (up from 10.8/31.7 at d/c).   No prior known history of IDA.  Normal H/H in 07/29/2022 (15/45), but has been microcytic for many years with MCV in the low 70s.  On hospital admission on 09/30/2022, H/H was 11.7/34.7, with inpatient nadir of 9.8/29.4.  No overt bleeding, but FOBT+ stool as inpatient.  No history of blood transfusions.  He was discharged home with oral iron that he took for 1 month.  Today, he states no overt bleeding. No prior known hx of anemia or iron deficiency.  No previous iron panels for comparison.  Father with polyps and PGM with Colon Cancer, diagnosed in her 60's. No Fhx Crohns, UC.   No prior EGD/Colonoscopy.     Past Medical History:  Diagnosis Date   Anemia    Arthralgia    Seasonal allergies    Tinea pedis      Past Surgical History:  Procedure Laterality Date   TEE WITHOUT CARDIOVERSION N/A 10/04/2022   Procedure: TRANSESOPHAGEAL ECHOCARDIOGRAM;  Surgeon: Thurmon Fair, MD;  Location: [facility];  Service: Cardiovascular;  Laterality: N/A;   Family History  Problem Relation Age of Onset   Hyperlipidemia Father    Sleep apnea Father    Thyroid disease Father    Social History    Tobacco Use   Smoking status: Former    Types: Cigars    Quit date: 06/05/2010    Years since quitting: 12.6   Smokeless tobacco: Never  Substance Use Topics   Alcohol use: Yes    Comment: 1-2 times per month   Drug use: No   Current Outpatient Medications  Medication Sig Dispense Refill   acetaminophen (TYLENOL) 500 MG tablet Take 500 mg by mouth 2 (two) times daily as needed for moderate pain.     cetirizine (ZYRTEC) 10 MG tablet Take 10 mg by mouth daily as needed for allergies.     clobetasol (TEMOVATE) 0.05 % GEL Apply 1 Application topically as needed.     ibuprofen (ADVIL) 800 MG tablet Take 800 mg by mouth 2 (two) times daily as needed for moderate pain.     inFLIXimab (REMICADE) 100 MG injection Inject into the skin every 6 (six) weeks.     ferrous sulfate 325 (65 FE) MG tablet Take 1 tablet (325 mg total) by mouth daily with breakfast. (Patient not taking: Reported on 01/23/2023) 90 tablet 3   flurbiprofen (ANSAID) 100 MG tablet Take 100 mg by mouth 2 (two) times daily. (Patient not taking: Reported on 01/23/2023)     pantoprazole (PROTONIX) 40 MG tablet Take 1 tablet (40 mg total) by mouth daily. (Patient not taking: Reported on 01/23/2023) 30  tablet 1   No current facility-administered medications for this visit.   Allergies  Allergen Reactions   Amoxicillin Other (See Comments)    Mouth sores/thrush      Review of Systems: All systems reviewed and negative except where noted in HPI.     Physical Exam:    Wt Readings from Last 3 Encounters:  01/23/23 218 lb (98.9 kg)  10/11/22 197 lb 4 oz (89.5 kg)  10/04/22 199 lb 6.4 oz (90.4 kg)    BP 122/82   Ht 5\' 7"  (1.702 m)   Wt 218 lb (98.9 kg)   BMI 34.14 kg/m  Constitutional:  Pleasant, in no acute distress. Psychiatric: Normal mood and affect. Behavior is normal. Cardiovascular: Normal rate, regular rhythm. No edema Pulmonary/chest: Effort normal and breath sounds normal. No wheezing, rales or  rhonchi. Abdominal: Soft, nondistended, nontender. Bowel sounds active throughout. There are no masses palpable. No hepatomegaly. Neurological: Alert and oriented to person place and time. Skin: Skin is warm and dry. No rashes noted.   ASSESSMENT AND PLAN;   1) Iron deficiency anemia 2) Heme positive stool Discussed the potential GI etiologies for IDA and heme positive stool at length today.  Plan for the following: - Repeat CBC and iron panel now to determine whether or not to resume iron therapy - EGD with biopsies and colonoscopy for diagnostic and therapeutic intent - If evaluation unrevealing, plan for further small bowel interrogation with VCE  3) Psoriasis 4) Chronic immunosuppression - Evaluate for e/o IBD at time of EGD/colonoscopy as above - Continue follow-up with Rheumatology  The indications, risks, and benefits of EGD and colonoscopy were explained to the patient in detail. Risks include but are not limited to bleeding, perforation, adverse reaction to medications, and cardiopulmonary compromise. Sequelae include but are not limited to the possibility of surgery, hospitalization, and mortality. The patient verbalized understanding and wished to proceed. All questions answered, referred to scheduler and bowel prep ordered. Further recommendations pending results of the exam.     Shellia Cleverly, DO, FACG  01/23/2023, 9:24 AM   [place], [place], *

## 2023-04-02 ENCOUNTER — Encounter: Payer: BC Managed Care – PPO | Admitting: Gastroenterology

## 2023-04-07 IMAGING — CT CT CERVICAL SPINE W/O CM
3 of 4 series · 13 of 33 positions shown, 16 images · non-contrast
Comparison: None.

CLINICAL DATA: Seizure and fall.  Hit the back of his head.

EXAM:
CT HEAD WITHOUT CONTRAST
CT CERVICAL SPINE WITHOUT CONTRAST
TECHNIQUE: Multidetector CT imaging of the head and cervical spine was
performed following the standard protocol without intravenous
contrast. Multiplanar CT image reconstructions of the cervical spine
were also generated.

[Series 4: c_spine 2.0 st · axial · 0.32mm/px · z∈[+696,+856]mm · 5 of 120 slices shown, 7 images]
[im 20/120  soft-tissue]
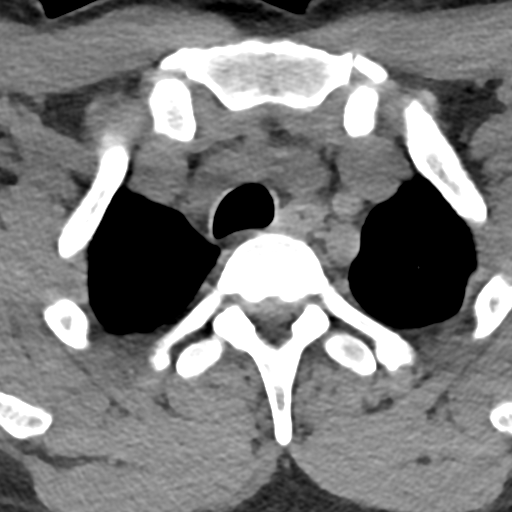
[im 20/120  bone]
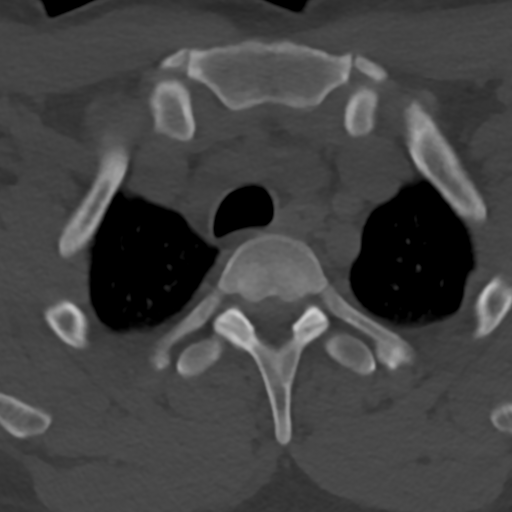
[im 40/120  bone]
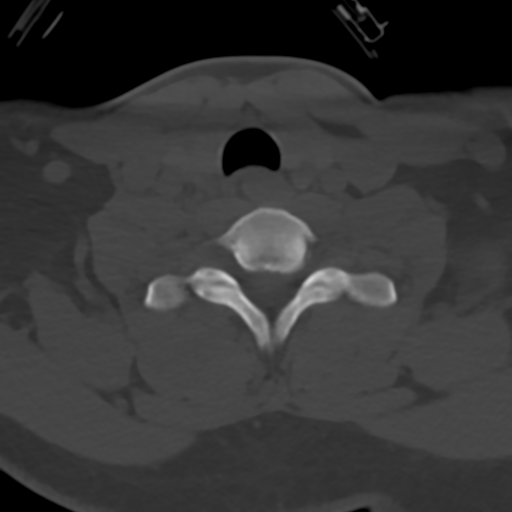
[im 60/120  bone]
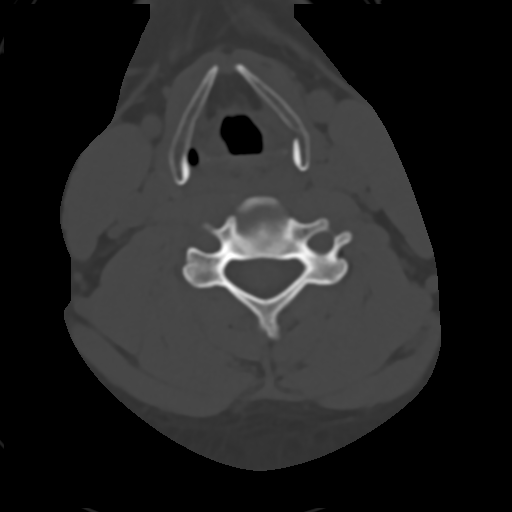
[im 80/120  bone]
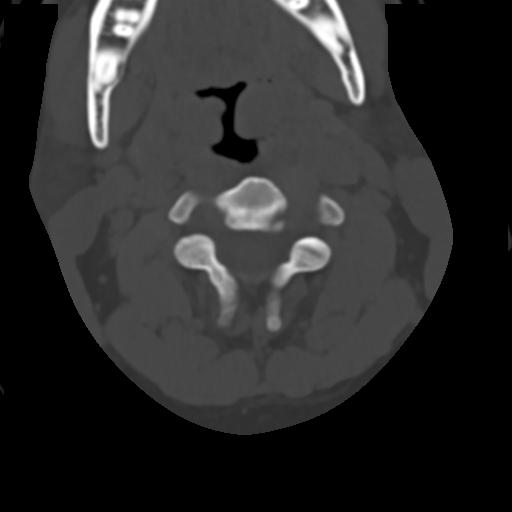
[im 100/120  soft-tissue]
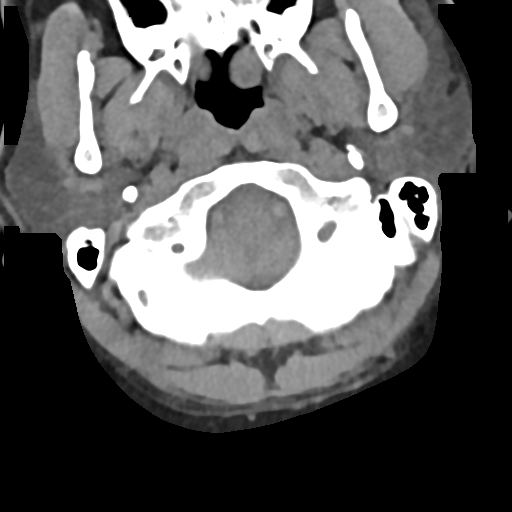
[im 100/120  bone]
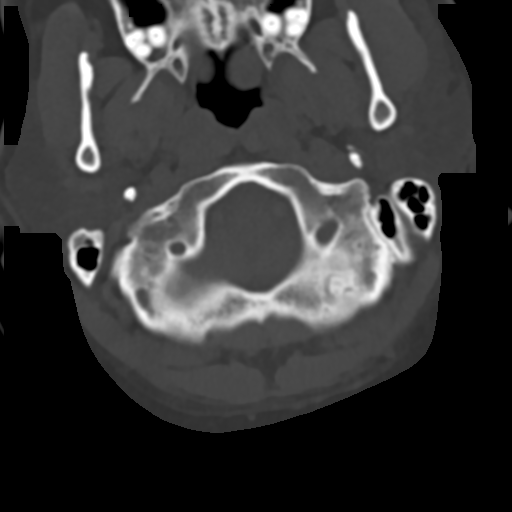

[Series 6: c_spine 2.0 sag bone · sagittal · 0.35mm/px · 5 of 72 slices shown, 6 images]
[im 24/72  bone]
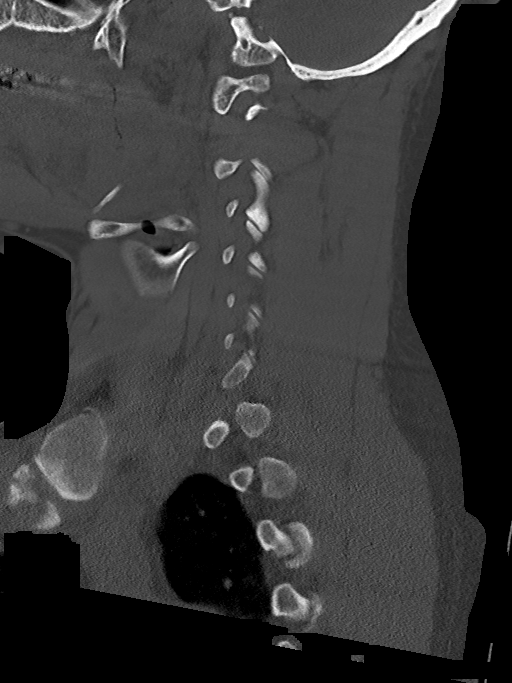
[im 30/72  bone]
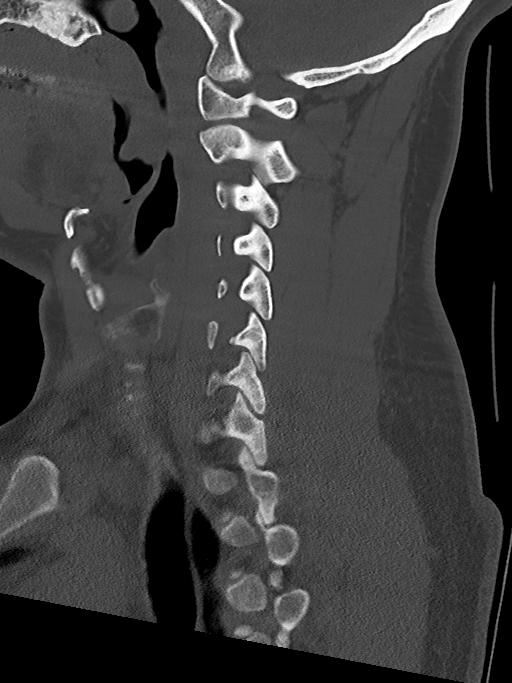
[im 36/72  soft-tissue]
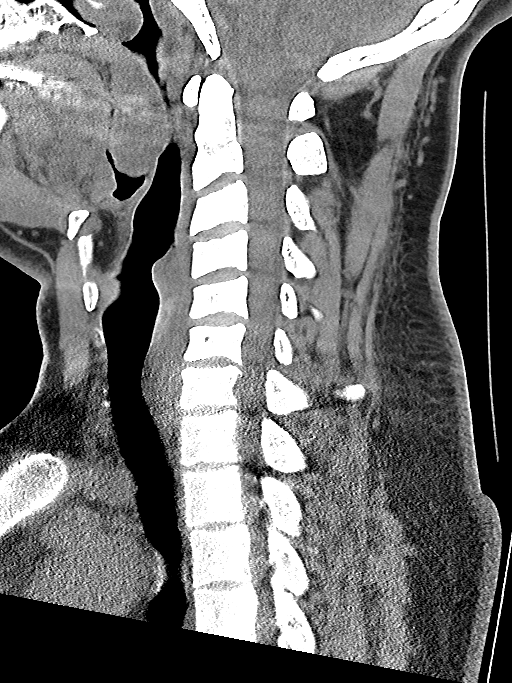
[im 36/72  bone]
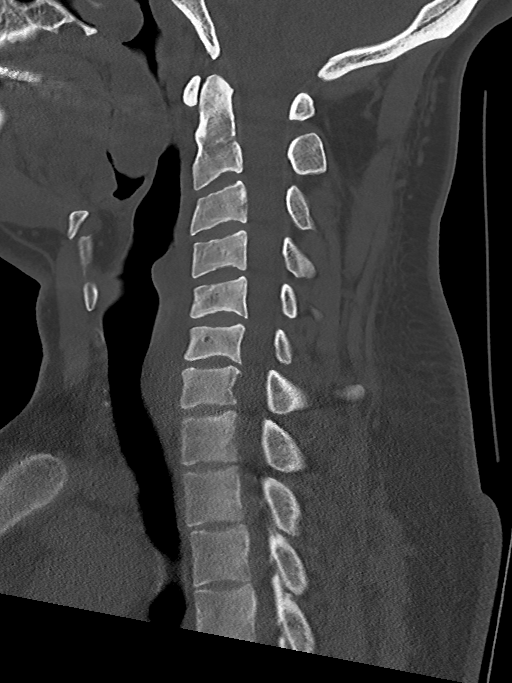
[im 42/72  bone]
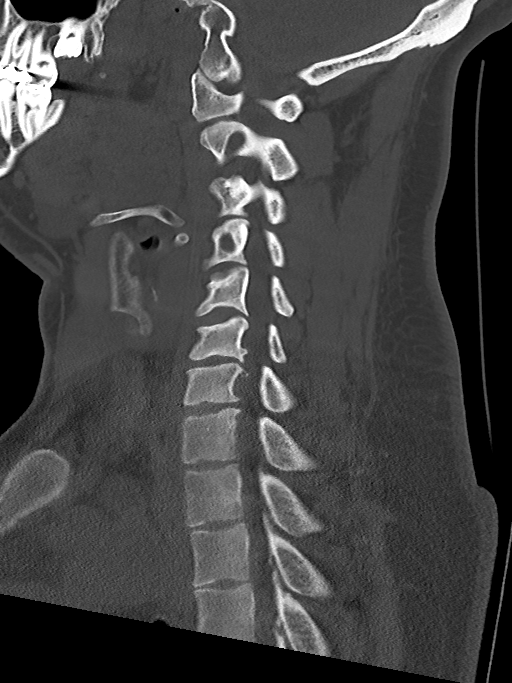
[im 48/72  bone]
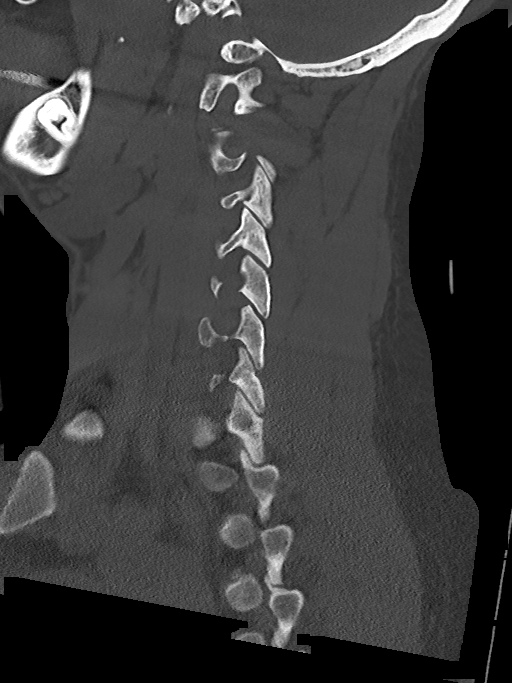

[Series 7: c_spine 2.0 cor bone · coronal · 0.35mm/px · 3 of 61 slices shown]
[im 13/61  bone]
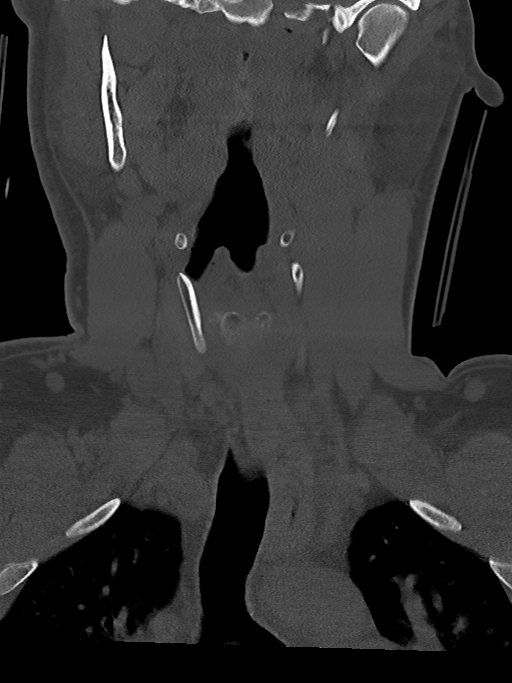
[im 25/61  bone]
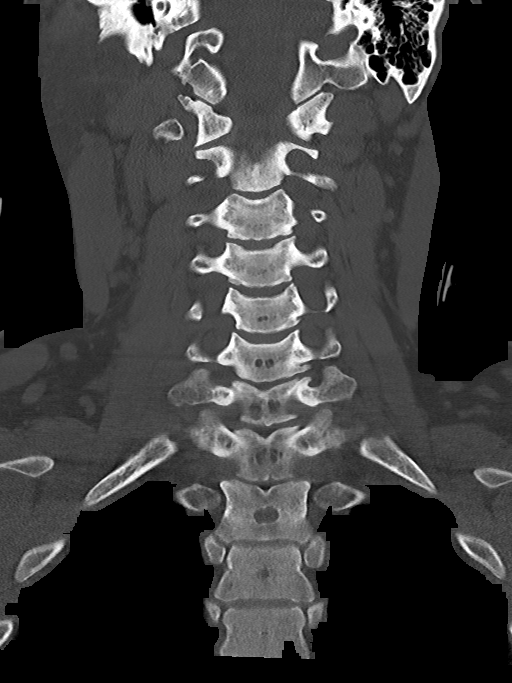
[im 37/61  bone]
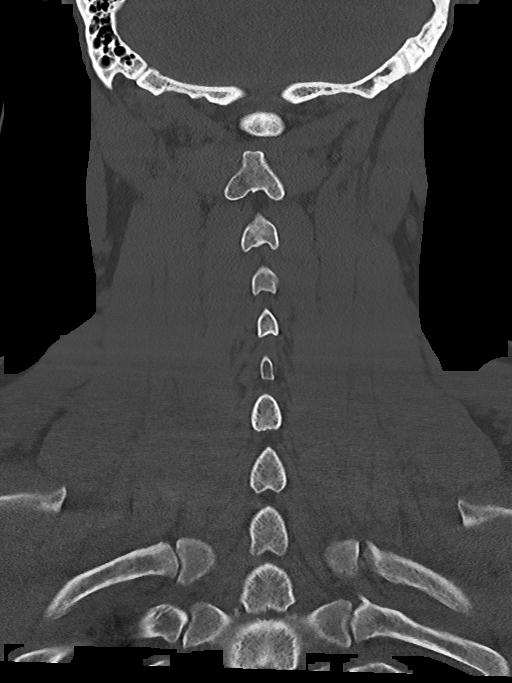

[13 of 33 positions shown; findings below may reference images not displayed]

FINDINGS: CT HEAD FINDINGS

Brain: No evidence of acute infarction, hemorrhage, hydrocephalus,
extra-axial collection or mass lesion/mass effect.

Vascular: No hyperdense vessel or unexpected calcification.

Skull: Normal. Negative for fracture or focal lesion.

Sinuses/Orbits: No acute finding.

Other: Moderate left occipital scalp hematoma.

CT CERVICAL SPINE FINDINGS

Alignment: No traumatic malalignment. Mild reversal of the normal
cervical lordosis.

Skull base and vertebrae: No acute fracture. No primary bone lesion
or focal pathologic process.

Soft tissues and spinal canal: No prevertebral fluid or swelling. No
visible canal hematoma.

Disc levels: Mild disc height loss at C6-C7 and C7-T1. Moderate left
neuroforaminal stenosis at C6-C7 due to uncovertebral hypertrophy.

Upper chest: Negative.

Other: None.
IMPRESSION: 1. No acute intracranial abnormality. Moderate left occipital scalp
hematoma.
2. No acute cervical spine fracture or traumatic malalignment.

## 2023-04-07 IMAGING — CT CT HEAD W/O CM
4 series · 16 of 47 positions shown, 18 images · non-contrast
Comparison: None.

CLINICAL DATA: Seizure and fall.  Hit the back of his head.

EXAM:
CT HEAD WITHOUT CONTRAST
CT CERVICAL SPINE WITHOUT CONTRAST
TECHNIQUE: Multidetector CT imaging of the head and cervical spine was
performed following the standard protocol without intravenous
contrast. Multiplanar CT image reconstructions of the cervical spine
were also generated.

[Series 3: head without · axial · non-contrast · 0.44mm/px · z∈[+900,+1025]mm · 7 of 35 slices shown, 9 images]
[im 5/35  brain]
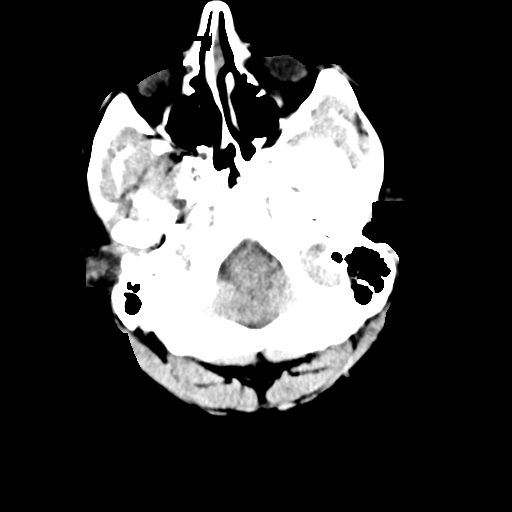
[im 5/35  bone]
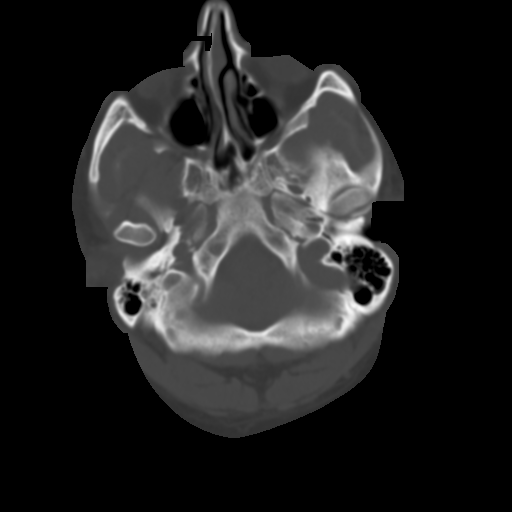
[im 9/35  brain]
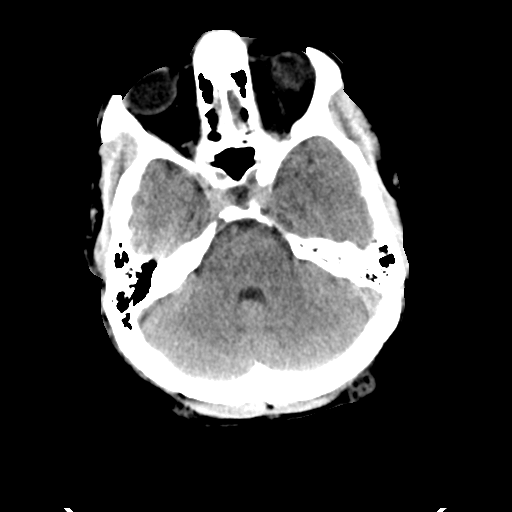
[im 13/35  brain]
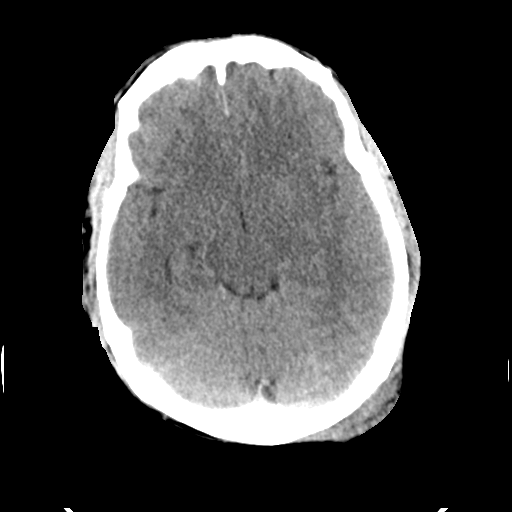
[im 18/35  brain]
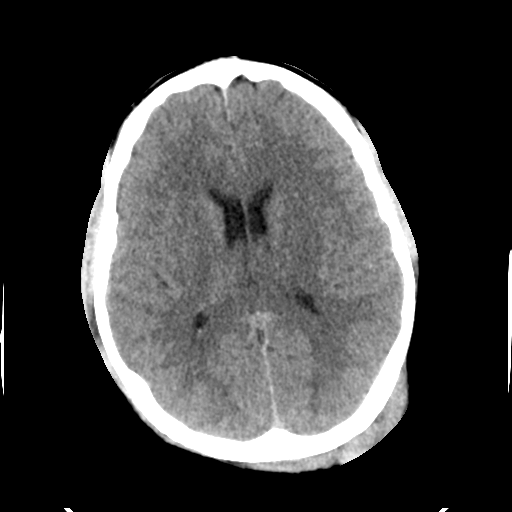
[im 22/35  brain]
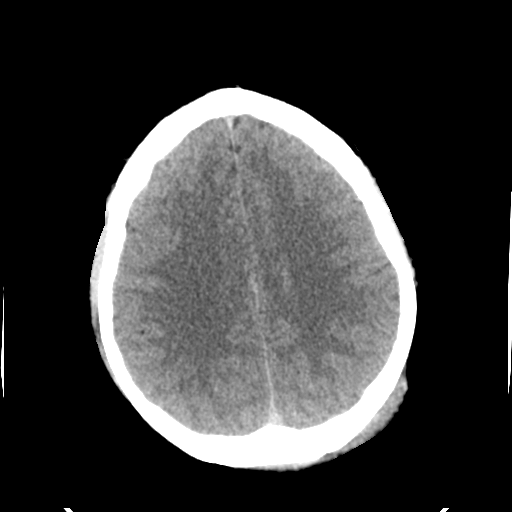
[im 22/35  bone]
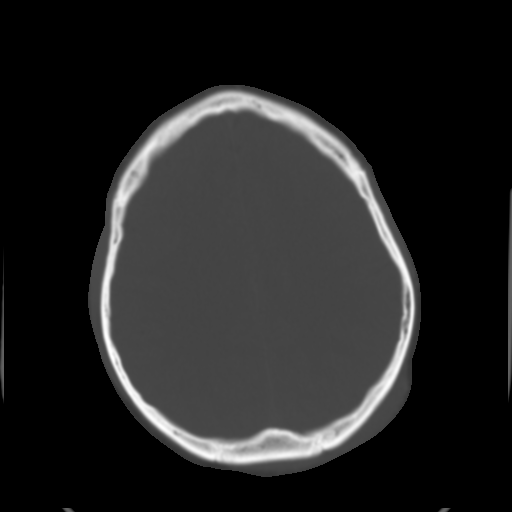
[im 26/35  brain]
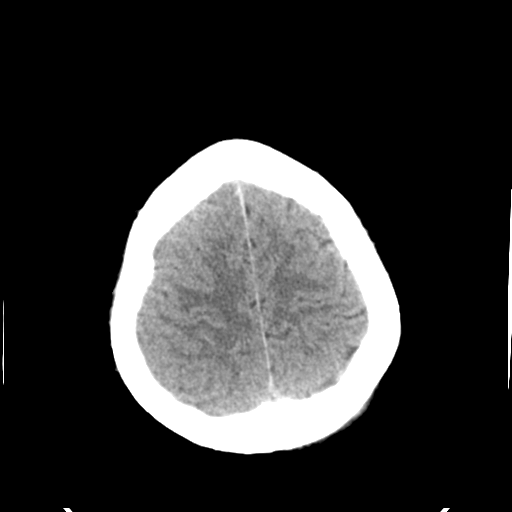
[im 30/35  brain]
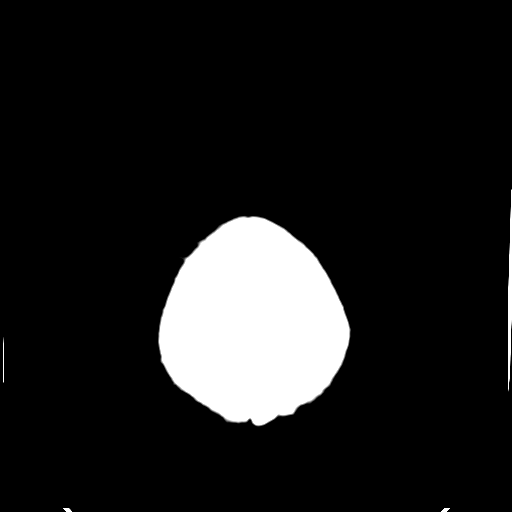

[Series 4: head bone · axial · 0.44mm/px · z∈[+896,+930]mm · 3 of 87 slices shown]
[im 9/87  bone]
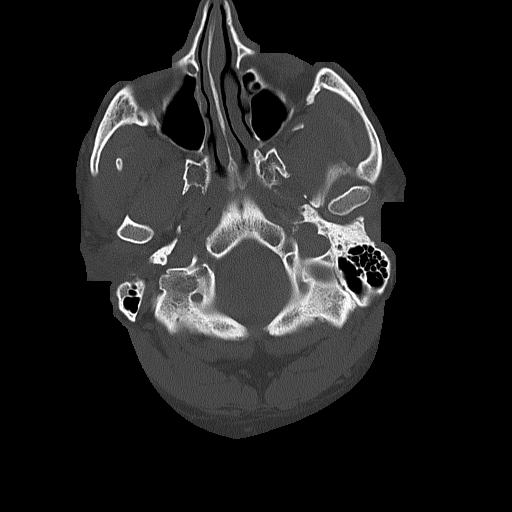
[im 18/87  bone]
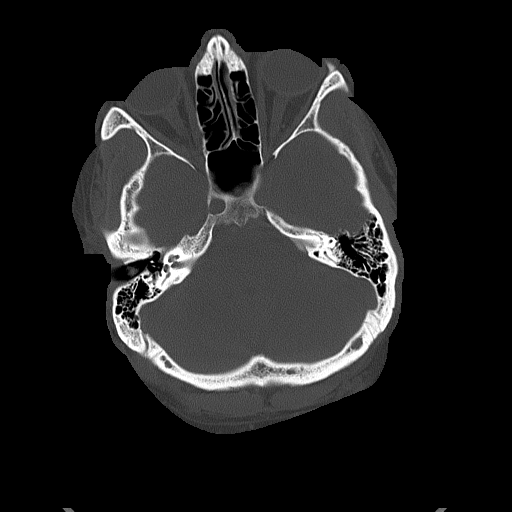
[im 26/87  bone]
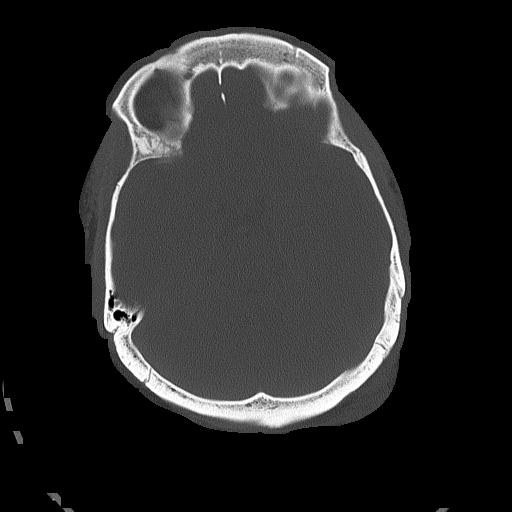

[Series 5: head without cor · coronal · non-contrast · 0.34mm/px · 3 of 74 slices shown]
[im 25/74  brain]
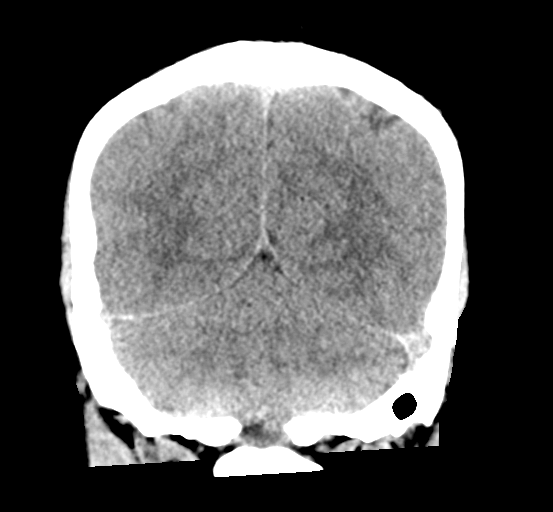
[im 33/74  brain]
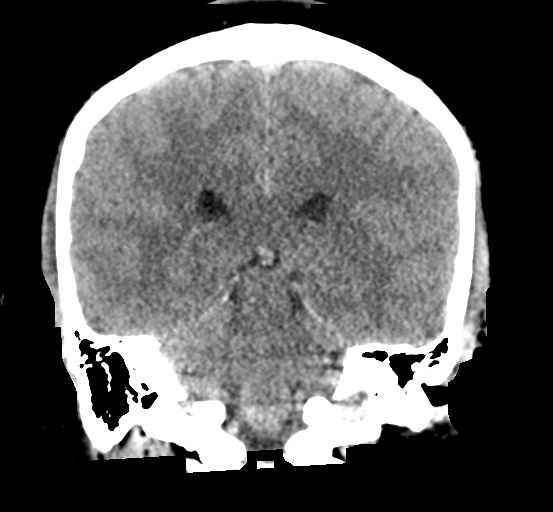
[im 41/74  brain]
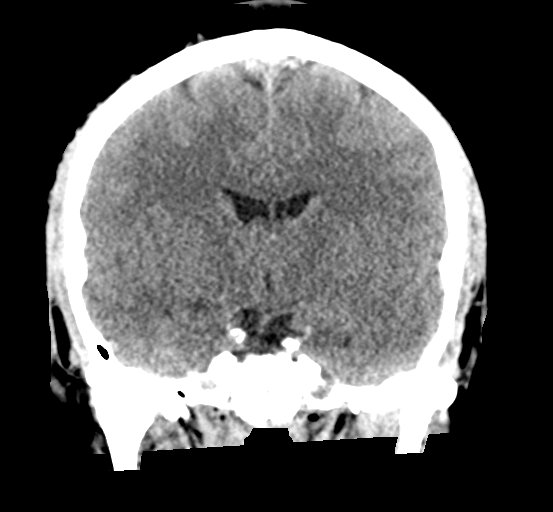

[Series 6: head without sag · sagittal · non-contrast · 0.35mm/px · 3 of 61 slices shown]
[im 21/61  brain]
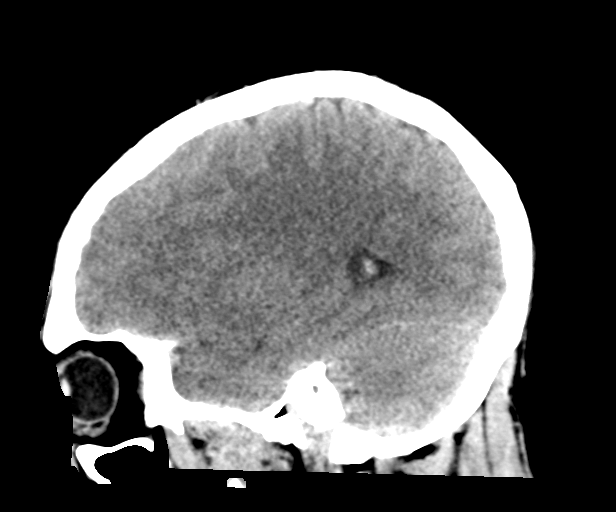
[im 31/61  brain]
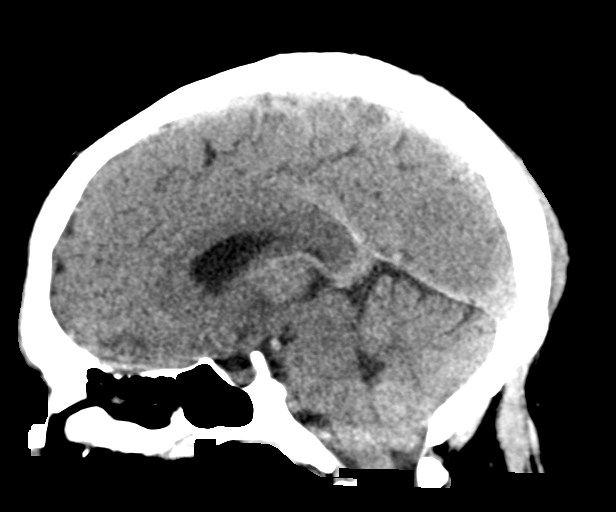
[im 41/61  brain]
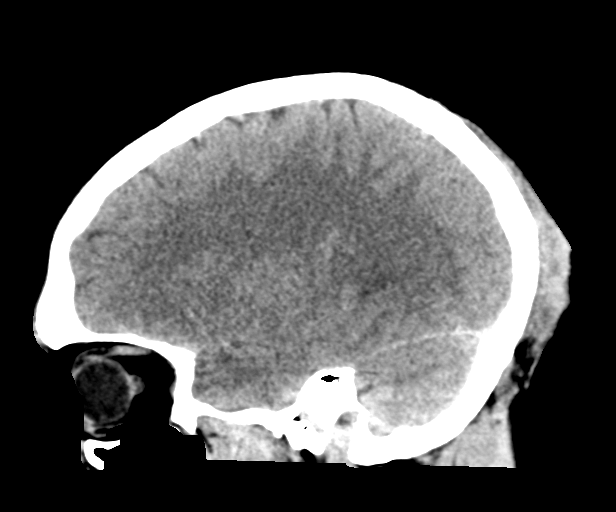

[16 of 47 positions shown; findings below may reference images not displayed]

FINDINGS: CT HEAD FINDINGS

Brain: No evidence of acute infarction, hemorrhage, hydrocephalus,
extra-axial collection or mass lesion/mass effect.

Vascular: No hyperdense vessel or unexpected calcification.

Skull: Normal. Negative for fracture or focal lesion.

Sinuses/Orbits: No acute finding.

Other: Moderate left occipital scalp hematoma.

CT CERVICAL SPINE FINDINGS

Alignment: No traumatic malalignment. Mild reversal of the normal
cervical lordosis.

Skull base and vertebrae: No acute fracture. No primary bone lesion
or focal pathologic process.

Soft tissues and spinal canal: No prevertebral fluid or swelling. No
visible canal hematoma.

Disc levels: Mild disc height loss at C6-C7 and C7-T1. Moderate left
neuroforaminal stenosis at C6-C7 due to uncovertebral hypertrophy.

Upper chest: Negative.

Other: None.
IMPRESSION: 1. No acute intracranial abnormality. Moderate left occipital scalp
hematoma.
2. No acute cervical spine fracture or traumatic malalignment.

## 2023-04-09 ENCOUNTER — Emergency Department (HOSPITAL_BASED_OUTPATIENT_CLINIC_OR_DEPARTMENT_OTHER)
Admission: EM | Admit: 2023-04-09 | Discharge: 2023-04-09 | Disposition: A | Discharge status: Home / Self Care | Payer: BC Managed Care – PPO | Attending: Emergency Medicine | Admitting: Emergency Medicine

## 2023-04-09 ENCOUNTER — Other Ambulatory Visit (HOSPITAL_BASED_OUTPATIENT_CLINIC_OR_DEPARTMENT_OTHER): Payer: Self-pay

## 2023-04-09 ENCOUNTER — Other Ambulatory Visit: Payer: Self-pay

## 2023-04-09 DIAGNOSIS — J020 Streptococcal pharyngitis: Secondary | ICD-10-CM | POA: Insufficient documentation

## 2023-04-09 DIAGNOSIS — Z20822 Contact with and (suspected) exposure to covid-19: Secondary | ICD-10-CM | POA: Diagnosis not present

## 2023-04-09 DIAGNOSIS — R1310 Dysphagia, unspecified: Secondary | ICD-10-CM | POA: Diagnosis present

## 2023-04-09 LAB — RESP PANEL BY RT-PCR (RSV, FLU A&B, COVID)  RVPGX2
Influenza A by PCR: NEGATIVE
Influenza B by PCR: NEGATIVE
Resp Syncytial Virus by PCR: NEGATIVE
SARS Coronavirus 2 by RT PCR: NEGATIVE

## 2023-04-09 LAB — GROUP A STREP BY PCR: Group A Strep by PCR: DETECTED — AB

## 2023-04-09 MED ORDER — AZITHROMYCIN 250 MG PO TABS
500.0000 mg | ORAL_TABLET | Freq: Every day | ORAL | 0 refills | Status: AC
  Filled 2023-04-09: qty 6, 5d supply, fill #0

## 2023-04-09 MED ORDER — DEXAMETHASONE 4 MG PO TABS
10.0000 mg | ORAL_TABLET | Freq: Once | ORAL | Status: AC
  Administered 2023-04-09: 10 mg via ORAL
  Filled 2023-04-09: qty 3

## 2023-04-09 MED ORDER — PENICILLIN G BENZATHINE 1200000 UNIT/2ML IM SUSY: 2.4000 10*6.[IU] | PREFILLED_SYRINGE | Freq: Once | INTRAMUSCULAR | Status: DC

## 2023-04-09 NOTE — ED Provider Notes (Signed)
[place] EMERGENCY DEPARTMENT AT [facility] Provider Note   CSN: [id number] Arrival date & time: 04/09/23  0846     History  Chief Complaint  Patient presents with   Sore Throat    Darren Ramirez is a 37 y.o. male.  Patient here with sore throat since yesterday.  Denies any fever or chills.  Some pain with swallowing.  Nothing makes it worse or better.  Denies any recent surgery.  Denies any difficulty opening mouth.  Denies any issues eating or drinking.  He has spit up a lot that caused vomiting 1 time.  But he is feeling no abdominal pain nausea vomiting diarrhea currently.  The history is provided by the patient.       Home Medications Prior to Admission medications   Medication Sig Start Date End Date Taking? Authorizing Provider  azithromycin (ZITHROMAX) 250 MG tablet Take 2 tablets (500 mg total) by mouth daily for 5 days. Take first 2 tablets together, then 1 every day until finished. 04/09/23 04/14/23 Yes Britain Anagnos, DO  acetaminophen (TYLENOL) 500 MG tablet Take 500 mg by mouth 2 (two) times daily as needed for moderate pain.    [provider]  cetirizine (ZYRTEC) 10 MG tablet Take 10 mg by mouth daily as needed for allergies.    [provider]  clobetasol (TEMOVATE) 0.05 % GEL Apply 1 Application topically as needed.    [provider]  ferrous sulfate 325 (65 FE) MG tablet Take 1 tablet (325 mg total) by mouth daily with breakfast. Patient not taking: Reported on 01/23/2023 10/05/22   Arnetha Courser, MD  flurbiprofen (ANSAID) 100 MG tablet Take 100 mg by mouth 2 (two) times daily. Patient not taking: Reported on 01/23/2023 09/25/22   [provider]  ibuprofen (ADVIL) 800 MG tablet Take 800 mg by mouth 2 (two) times daily as needed for moderate pain.    [provider]  inFLIXimab (REMICADE) 100 MG injection Inject into the skin every 6 (six) weeks.    [provider]  pantoprazole (PROTONIX) 40 MG  tablet Take 1 tablet (40 mg total) by mouth daily. Patient not taking: Reported on 01/23/2023 10/04/22   Arnetha Courser, MD      Allergies    Amoxicillin    Review of Systems   Review of Systems  Physical Exam Updated Vital Signs BP 133/80 (BP Location: Left Arm)   Pulse 82   Temp 98.6 F (37 C) (Oral)   Resp 18   SpO2 98%  Physical Exam Vitals and nursing note reviewed.  Constitutional:      General: He is not in acute distress.    Appearance: He is well-developed.  HENT:     Head: Normocephalic and atraumatic.     Right Ear: Tympanic membrane normal.     Left Ear: Tympanic membrane normal.     Nose: Congestion present.     Mouth/Throat:     Mouth: Mucous membranes are moist. No oral lesions.     Pharynx: Uvula midline. Posterior oropharyngeal erythema present. No pharyngeal swelling or oropharyngeal exudate.     Tonsils: No tonsillar exudate or tonsillar abscesses.  Eyes:     Conjunctiva/sclera: Conjunctivae normal.  Cardiovascular:     Rate and Rhythm: Normal rate and regular rhythm.     Heart sounds: No murmur heard. Pulmonary:     Effort: Pulmonary effort is normal. No respiratory distress.     Breath sounds: Normal breath sounds.  Abdominal:  Palpations: Abdomen is soft.     Tenderness: There is no abdominal tenderness.  Musculoskeletal:        General: No swelling.     Cervical back: Neck supple.  Skin:    General: Skin is warm and dry.     Capillary Refill: Capillary refill takes less than 2 seconds.  Neurological:     Mental Status: He is alert.  Psychiatric:        Mood and Affect: Mood normal.     ED Results / Procedures / Treatments   Labs (all labs ordered are listed, but only abnormal results are displayed) Labs Reviewed  GROUP A STREP BY PCR - Abnormal; Notable for the following components:      Result Value   Group A Strep by PCR DETECTED (*)    All other components within normal limits  RESP PANEL BY RT-PCR (RSV, FLU A&B, COVID)  [id number]     EKG None  Radiology No results found.  Procedures Procedures    Medications Ordered in ED Medications  dexamethasone (DECADRON) tablet 10 mg (10 mg Oral Given 04/09/23 0910)    ED Course/ Medical Decision Making/ A&P                                 Medical Decision Making Risk Prescription drug management.   Darren Ramirez is here with sore throat.  Normal vitals.  No fever.  No trismus or drooling.  Is got erythema to his posterior oropharynx.  But there is no concern for abscess or deep space infection per my exam.  Normal speech, no muffled voice or trismus or drooling.  Differential is likely strep pharyngitis versus viral process.  Will test for both.  Will give Decadron.  Per my review and interpretation labs he is positive for strep.  Chart does state amoxicillin allergy but he states that he had like a tongue sore or thrush when he had it but no swelling or severe allergic reaction.  But overall we will avoid penicillin at this time.  Will treat with loop of the mass.  Discharged in good condition.  Understands return precautions.  This chart was dictated using voice recognition software.  Despite best efforts to proofread,  errors can occur which can change the documentation meaning.         Final Clinical Impression(s) / ED Diagnoses Final diagnoses:  Strep pharyngitis    Rx / DC Orders ED Discharge Orders          Ordered    azithromycin (ZITHROMAX) 250 MG tablet  Daily        04/09/23 0936              Virgina Norfolk, DO 04/09/23 [phone]

## 2023-04-09 NOTE — Discharge Instructions (Addendum)
Take antibiotic as prescribed.  I have treated you with a long-acting steroid to help with symptoms as well.  Please return if symptoms worsen.  Continue Tylenol and ibuprofen for pain as well.

## 2023-04-09 NOTE — ED Notes (Signed)
Swabs walked to lab.

## 2023-04-11 IMAGING — DX DG CHEST 2V
2 series · 2 of 2 positions shown · non-contrast
Comparison: Xray Chest 07/04/2018.  Xray Chest 10/07/2015.

CLINICAL DATA: Status post seizure

EXAM:
CHEST - 2 VIEW

[chest pa]
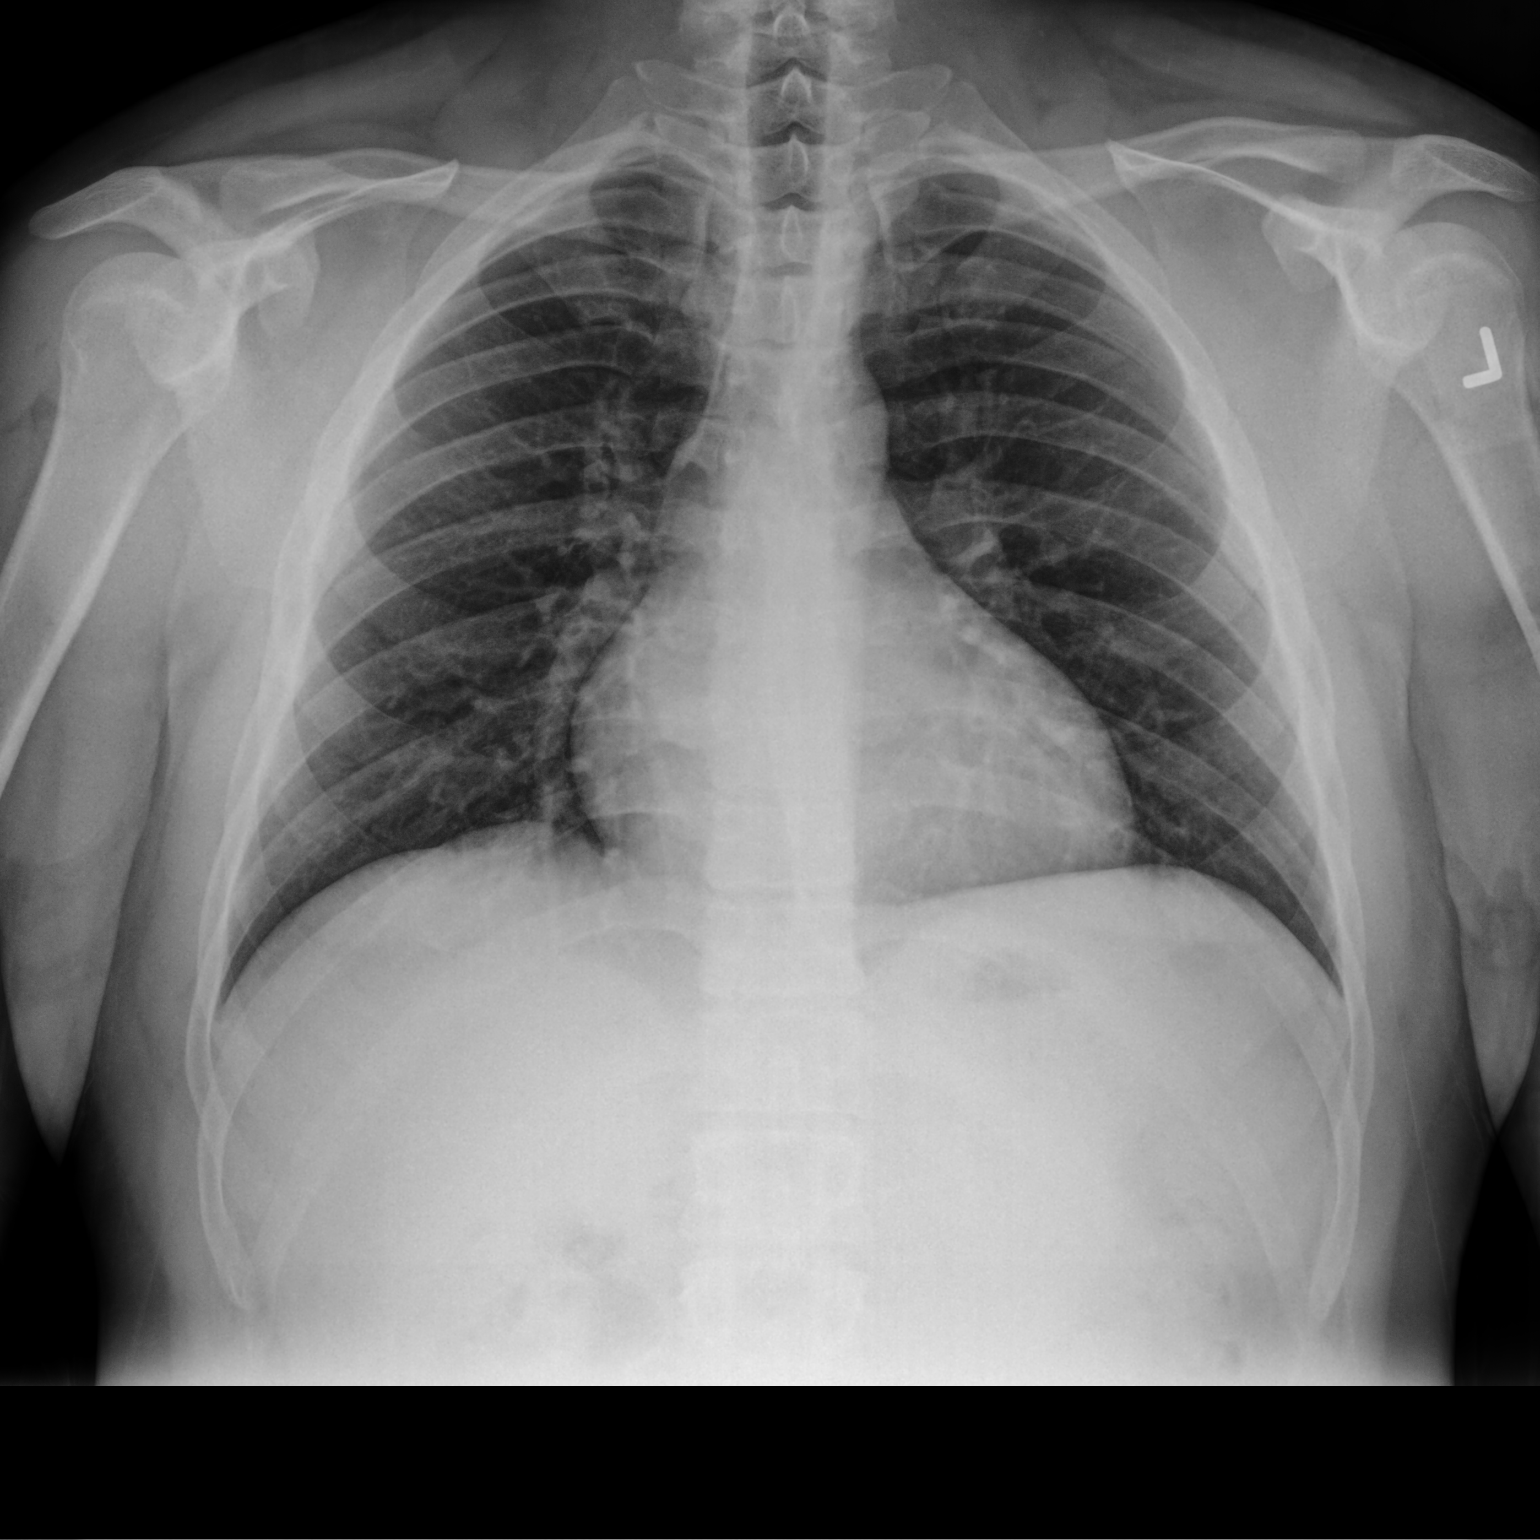

[chest lat]
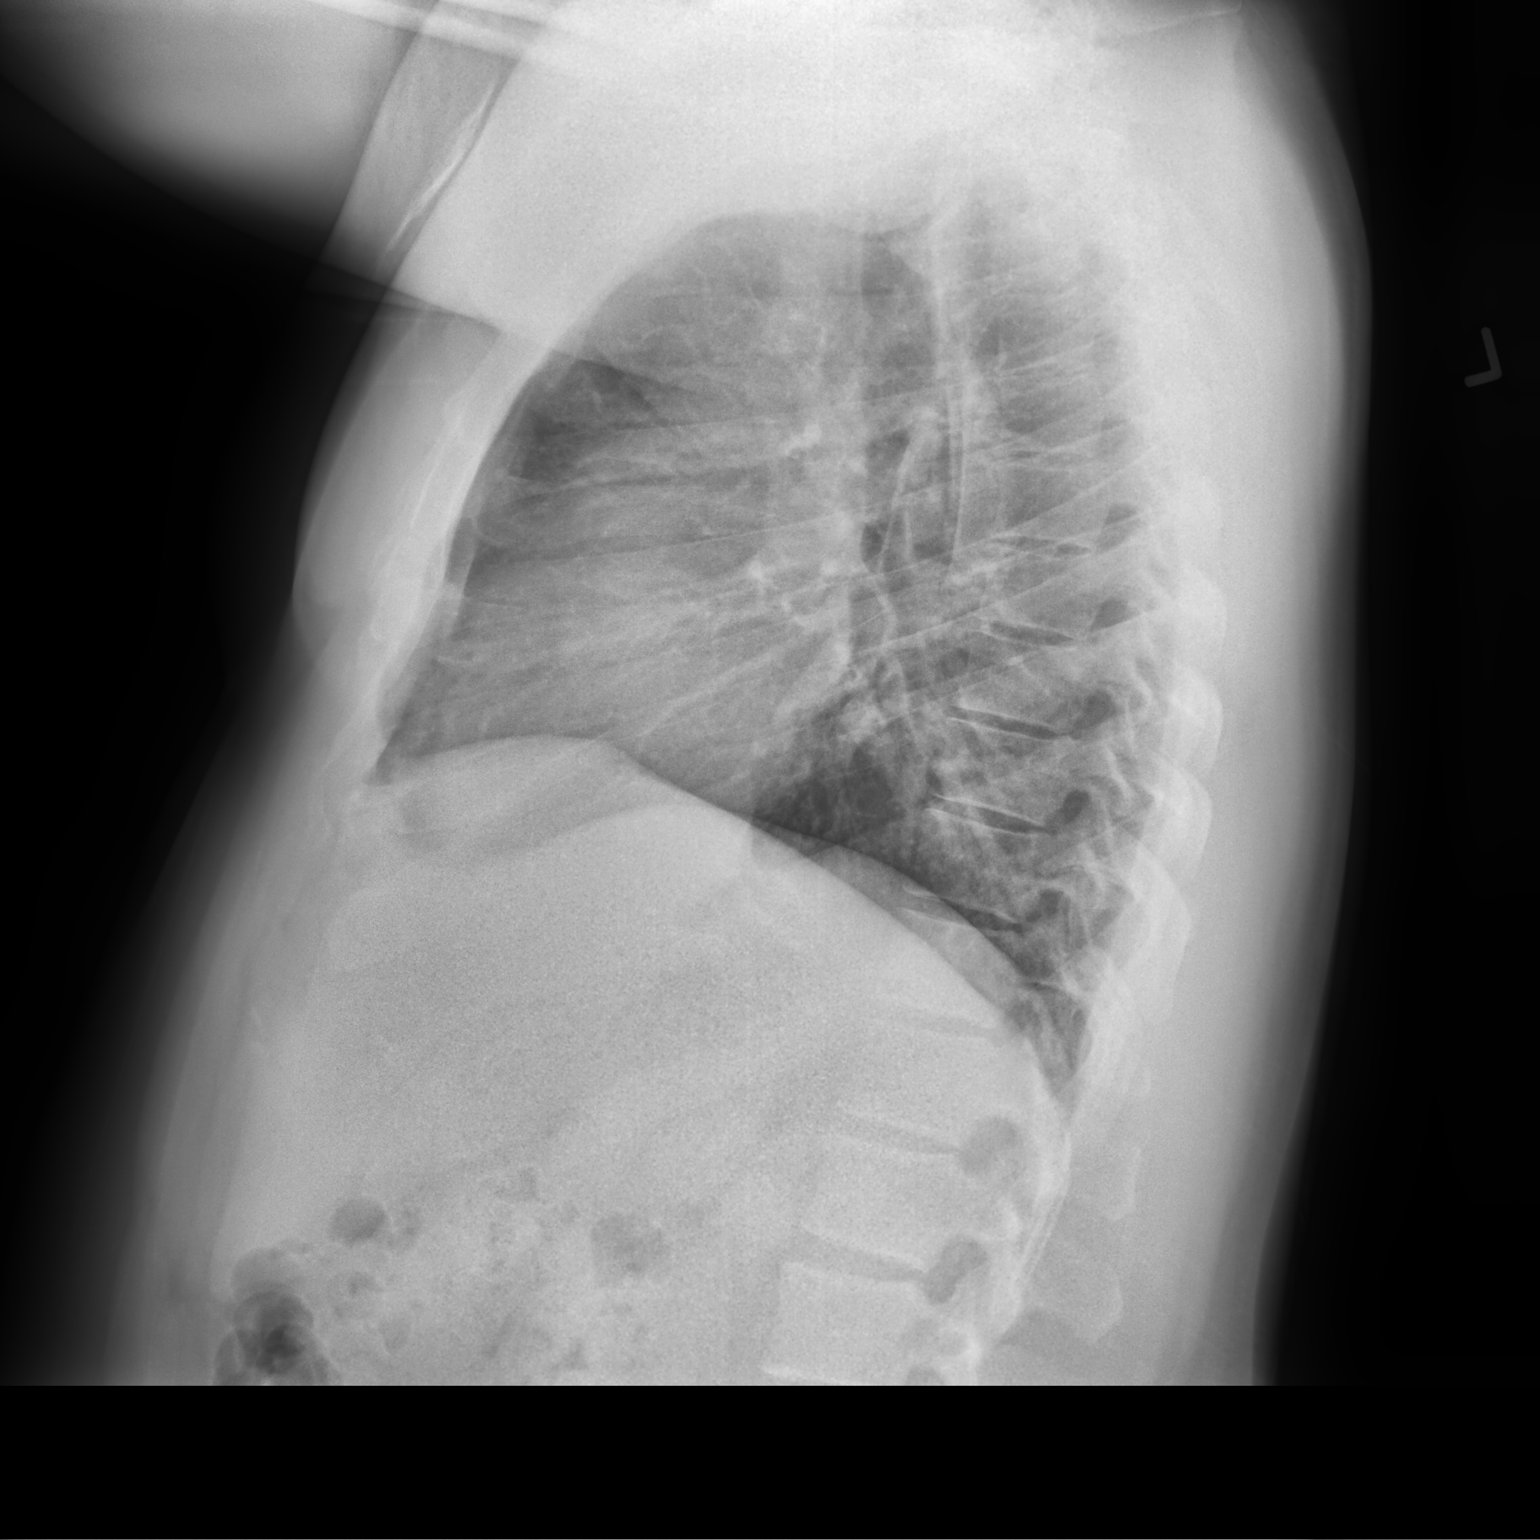

[2 of 2 positions shown; findings below may reference images not displayed]

FINDINGS: The heart size and mediastinal contours are within normal limits.
Both lungs are clear. The visualized skeletal structures are
unremarkable.
IMPRESSION: No active cardiopulmonary disease.

## 2023-04-12 ENCOUNTER — Telehealth: Payer: Self-pay | Admitting: Gastroenterology

## 2023-04-12 MED ORDER — NA SULFATE-K SULFATE-MG SULF 17.5-3.13-1.6 GM/177ML PO SOLN: ORAL | 0 refills | Status: DC

## 2023-04-12 NOTE — Telephone Encounter (Signed)
Inbound Call from patient, would like his prep medication resent to [facility] on [address]. States he did not pick it up on time.

## 2023-04-12 NOTE — Telephone Encounter (Signed)
Rx resent.

## 2023-04-19 ENCOUNTER — Ambulatory Visit: Payer: BC Managed Care – PPO | Admitting: Gastroenterology

## 2023-04-19 ENCOUNTER — Encounter: Payer: Self-pay | Admitting: Gastroenterology

## 2023-04-19 VITALS — BP 109/71 | HR 67 | Temp 98.8°F | Resp 12 | Ht 67.0 in | Wt 218.0 lb

## 2023-04-19 DIAGNOSIS — K2100 Gastro-esophageal reflux disease with esophagitis, without bleeding: Secondary | ICD-10-CM

## 2023-04-19 DIAGNOSIS — K2970 Gastritis, unspecified, without bleeding: Secondary | ICD-10-CM | POA: Diagnosis not present

## 2023-04-19 DIAGNOSIS — D509 Iron deficiency anemia, unspecified: Secondary | ICD-10-CM

## 2023-04-19 DIAGNOSIS — R195 Other fecal abnormalities: Secondary | ICD-10-CM

## 2023-04-19 DIAGNOSIS — K641 Second degree hemorrhoids: Secondary | ICD-10-CM

## 2023-04-19 DIAGNOSIS — K5730 Diverticulosis of large intestine without perforation or abscess without bleeding: Secondary | ICD-10-CM

## 2023-04-19 MED ORDER — PANTOPRAZOLE SODIUM 40 MG PO TBEC
40.0000 mg | DELAYED_RELEASE_TABLET | Freq: Two times a day (BID) | ORAL | 1 refills | Status: DC
Start: 2023-04-19 — End: 2023-05-11

## 2023-04-19 MED ORDER — SODIUM CHLORIDE 0.9 % IV SOLN
500.0000 mL | Freq: Once | INTRAVENOUS | Status: DC
Start: 2023-04-19 — End: 2023-04-19

## 2023-04-19 NOTE — Progress Notes (Signed)
Called to room to assist during endoscopic procedure.  Patient ID and intended procedure confirmed with present staff. Received instructions for my participation in the procedure from the performing physician.  

## 2023-04-19 NOTE — Progress Notes (Signed)
Report to PACU, RN, vss, BBS= Clear.  

## 2023-04-19 NOTE — Op Note (Signed)
[facility] Patient Name: Darren Ramirez Procedure Date: 04/19/2023 7:42 AM MRN: [id number] Endoscopist: Doristine Locks , MD, [id number] Age: 37 Referring MD:  Date of Birth: 1985/07/29 Gender: Male Account #: [id number] Procedure:                Colonoscopy Indications:              Heme positive stool, Iron deficiency anemia Medicines:                Monitored Anesthesia Care Procedure:                Pre-Anesthesia Assessment:                           - Prior to the procedure, a History and Physical                            was performed, and patient medications and                            allergies were reviewed. The patient's tolerance of                            previous anesthesia was also reviewed. The risks                            and benefits of the procedure and the sedation                            options and risks were discussed with the patient.                            All questions were answered, and informed consent                            was obtained. Prior Anticoagulants: The patient has                            taken no anticoagulant or antiplatelet agents. ASA                            Grade Assessment: II - A patient with mild systemic                            disease. After reviewing the risks and benefits,                            the patient was deemed in satisfactory condition to                            undergo the procedure.                           After obtaining informed consent, the colonoscope  was passed under direct vision. Throughout the                            procedure, the patient's blood pressure, pulse, and                            oxygen saturations were monitored continuously. The                            CF [id number] #[id number] was introduced through the anus                            and advanced to the the terminal ileum. The                            colonoscopy was  performed without difficulty. The                            patient tolerated the procedure well. The quality                            of the bowel preparation was good. The terminal                            ileum, ileocecal valve, appendiceal orifice, and                            rectum were photographed. Scope In: 8:13:40 AM Scope Out: 8:25:12 AM Scope Withdrawal Time: 0 hours 8 minutes 49 seconds  Total Procedure Duration: 0 hours 11 minutes 32 seconds  Findings:                 The perianal and digital rectal examinations were                            normal.                           A few small-mouthed diverticula were found in the                            sigmoid colon.                           The exam was otherwise normal throughout the                            remainder of the colon.                           Non-bleeding internal hemorrhoids were found during                            retroflexion. The hemorrhoids were small.  The terminal ileum appeared normal. Complications:            No immediate complications. Estimated Blood Loss:     Estimated blood loss: none. Impression:               - Diverticulosis in the sigmoid colon.                           - Non-bleeding internal hemorrhoids.                           - The examined portion of the ileum was normal.                           - No specimens collected. Recommendation:           - Patient has a contact number available for                            emergencies. The signs and symptoms of potential                            delayed complications were discussed with the                            patient. Return to normal activities tomorrow.                            Written discharge instructions were provided to the                            patient.                           - Resume previous diet.                           - Continue present medications.                            - Repeat colonoscopy in 10 years for screening                            purposes.                           - Return to GI office PRN. Doristine Locks, MD 04/19/2023 8:38:11 AM

## 2023-04-19 NOTE — Op Note (Signed)
[facility] Patient Name: Darren Ramirez Procedure Date: 04/19/2023 7:43 AM MRN: [id number] Endoscopist: Doristine Locks , MD, [id number] Age: 37 Referring MD:  Date of Birth: [birth year] Gender: Male Account #: [id number] Procedure:                Upper GI endoscopy Indications:              Iron deficiency anemia, Heme positive stool Medicines:                Monitored Anesthesia Care Procedure:                Pre-Anesthesia Assessment:                           - Prior to the procedure, a History and Physical                            was performed, and patient medications and                            allergies were reviewed. The patient's tolerance of                            previous anesthesia was also reviewed. The risks                            and benefits of the procedure and the sedation                            options and risks were discussed with the patient.                            All questions were answered, and informed consent                            was obtained. Prior Anticoagulants: The patient has                            taken no anticoagulant or antiplatelet agents. ASA                            Grade Assessment: II - A patient with mild systemic                            disease. After reviewing the risks and benefits,                            the patient was deemed in satisfactory condition to                            undergo the procedure.                           After obtaining informed consent, the endoscope was  passed under direct vision. Throughout the                            procedure, the patient's blood pressure, pulse, and                            oxygen saturations were monitored continuously. The                            GIF HQ190 #[id number] was introduced through the                            mouth, and advanced to the second part of duodenum.                            The  upper GI endoscopy was accomplished without                            difficulty. The patient tolerated the procedure                            well. Scope In: Scope Out: Findings:                 The tonsils are large.                           LA Grade B (one or more mucosal breaks greater than                            5 mm, not extending between the tops of two mucosal                            folds) esophagitis with no bleeding was found in                            the lower third of the esophagus.                           The gastroesophageal flap valve was visualized                            endoscopically and classified as Hill Grade III                            (minimal fold, loose to endoscope, hiatal hernia                            likely).                           A medium non-bleeding diverticulum was found in the                            gastric fundus.  Localized mild inflammation characterized by                            congestion (edema) and erythema was found in the                            gastric antrum. Biopsies were taken with a cold                            forceps for Helicobacter pylori testing. Estimated                            blood loss was minimal.                           The gastric body and incisura were normal.                           The examined duodenum was normal. Biopsies were                            taken with a cold forceps for histology. Estimated                            blood loss was minimal. Complications:            No immediate complications. Estimated Blood Loss:     Estimated blood loss was minimal. Impression:               - LA Grade B reflux esophagitis with no bleeding.                           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                            - Gastric diverticulum.                           - Gastritis. Biopsied.                           - Normal gastric body and incisura.                           - Normal examined duodenum. Biopsied. Recommendation:           - Patient has a contact number available for                            emergencies. The signs and symptoms of potential  delayed complications were discussed with the                            patient. Return to normal activities tomorrow.                            Written discharge instructions were provided to the                            patient.                           - Resume previous diet.                           - Continue present medications.                           - Await pathology results.                           - Use Protonix (pantoprazole) 40 mg PO BID for 4                            weeks, then reduce to 40 mg daily.                           - Referral to ENT for enlarged tonsils on this exam.                           - Colonoscopy today.                           - Repeat CBC and iron panel in 3 months. Doristine Locks, MD 04/19/2023 8:34:16 AM

## 2023-04-19 NOTE — Progress Notes (Signed)
GASTROENTEROLOGY PROCEDURE H&P NOTE   Primary Care Physician: Georgina Quint, MD    Reason for Procedure:  Iron deficiency anemia, FOBT positive stool, family history of colon cancer and colon polyps Plan:    EGD, colonoscopy  Patient is appropriate for endoscopic procedure(s) in the ambulatory ([facility]) setting.  The nature of the procedure, as well as the risks, benefits, and alternatives were carefully and thoroughly reviewed with the patient. Ample time for discussion and questions allowed. The patient understood, was satisfied, and agreed to proceed.     HPI: Darren Ramirez is a 37 y.o. male who presents for EGD and colonoscopy for evaluation of iron deficiency anemia, heme positive stool.  Family history notable for paternal grandmother with colon cancer in her 60s, and father with colon polyps.  Currently treated with Remicade for psoriasis.  Past Medical History:  Diagnosis Date   Anemia    Arthralgia    Seasonal allergies    Tinea pedis     Past Surgical History:  Procedure Laterality Date   TEE WITHOUT CARDIOVERSION N/A 10/04/2022   Procedure: TRANSESOPHAGEAL ECHOCARDIOGRAM;  Surgeon: Thurmon Fair, MD;  Location: [facility] INVASIVE CV LAB;  Service: Cardiovascular;  Laterality: N/A;    Prior to Admission medications   Medication Sig Start Date End Date Taking? Authorizing Provider  acetaminophen (TYLENOL) 500 MG tablet Take 500 mg by mouth 2 (two) times daily as needed for moderate pain.    [provider]  cetirizine (ZYRTEC) 10 MG tablet Take 10 mg by mouth daily as needed for allergies.    [provider]  clobetasol (TEMOVATE) 0.05 % GEL Apply 1 Application topically as needed.    [provider]  ferrous sulfate 325 (65 FE) MG tablet Take 1 tablet (325 mg total) by mouth daily with breakfast. Patient not taking: Reported on 01/23/2023 10/05/22   Arnetha Courser, MD  flurbiprofen (ANSAID) 100 MG tablet Take 100 mg by mouth 2 (two)  times daily. Patient not taking: Reported on 01/23/2023 09/25/22   [provider]  ibuprofen (ADVIL) 800 MG tablet Take 800 mg by mouth 2 (two) times daily as needed for moderate pain.    [provider]  inFLIXimab (REMICADE) 100 MG injection Inject into the skin every 6 (six) weeks.    [provider]    Current Outpatient Medications  Medication Sig Dispense Refill   acetaminophen (TYLENOL) 500 MG tablet Take 500 mg by mouth 2 (two) times daily as needed for moderate pain.     cetirizine (ZYRTEC) 10 MG tablet Take 10 mg by mouth daily as needed for allergies.     clobetasol (TEMOVATE) 0.05 % GEL Apply 1 Application topically as needed.     ferrous sulfate 325 (65 FE) MG tablet Take 1 tablet (325 mg total) by mouth daily with breakfast. (Patient not taking: Reported on 01/23/2023) 90 tablet 3   flurbiprofen (ANSAID) 100 MG tablet Take 100 mg by mouth 2 (two) times daily. (Patient not taking: Reported on 01/23/2023)     ibuprofen (ADVIL) 800 MG tablet Take 800 mg by mouth 2 (two) times daily as needed for moderate pain.     inFLIXimab (REMICADE) 100 MG injection Inject into the skin every 6 (six) weeks.     Current Facility-Administered Medications  Medication Dose Route Frequency Provider Last Rate Last Admin   0.9 %  sodium chloride infusion  500 mL Intravenous Once Mansour Balboa V, DO        Allergies as of  04/19/2023 - Review Complete 04/19/2023  Allergen Reaction Noted   Amoxicillin Other (See Comments) 09/30/2022    Family History  Problem Relation Age of Onset   Colon polyps Mother    Colon polyps Father    Hyperlipidemia Father    Sleep apnea Father    Thyroid disease Father    Colon cancer Neg Hx    Esophageal cancer Neg Hx    Rectal cancer Neg Hx    Stomach cancer Neg Hx     Social History   Socioeconomic History   Marital status: Single    Spouse name: Not on file   Number of children: Not on file   Years of education: Not on file    Highest education level: Not on file  Occupational History   Occupation: Holiday representative  Tobacco Use   Smoking status: Former    Types: Cigars    Quit date: 06/05/2010    Years since quitting: 12.8   Smokeless tobacco: Never  Substance and Sexual Activity   Alcohol use: Yes    Comment: 1-2 times per month   Drug use: No   Sexual activity: Not on file  Other Topics Concern   Not on file  Social History Narrative   Not on file   Social Determinants of Health   Financial Resource Strain: Not on file  Food Insecurity: No Food Insecurity (10/01/2022)   Hunger Vital Sign    Worried About Running Out of Food in the Last Year: Never true    Ran Out of Food in the Last Year: Never true  Transportation Needs: No Transportation Needs (10/01/2022)   PRAPARE - Administrator, Civil Service (Medical): No    Lack of Transportation (Non-Medical): No  Physical Activity: Not on file  Stress: Not on file  Social Connections: Unknown (10/08/2021)   Received from [facility]   Social Network    Social Network: Not on file  Intimate Partner Violence: Not At Risk (10/01/2022)   Humiliation, Afraid, Rape, and Kick questionnaire    Fear of Current or Ex-Partner: No    Emotionally Abused: No    Physically Abused: No    Sexually Abused: No    Physical Exam: Vital signs in last 24 hours: @BP  128/80   Pulse 68   Temp 98.8 F (37.1 C)   Ht 5\' 7"  (1.702 m)   Wt 218 lb (98.9 kg)   SpO2 98%   BMI 34.14 kg/m  GEN: NAD EYE: Sclerae anicteric ENT: MMM CV: Non-tachycardic Pulm: CTA b/l GI: Soft, NT/ND NEURO:  Alert & Oriented x 3   Doristine Locks, DO [facility]   04/19/2023 7:46 AM

## 2023-04-19 NOTE — Patient Instructions (Addendum)
A prescription has been sent to your pharmacy.  Repeat CBC and iron panel in 3 months.  Resume previous diet. Continue present medications.  Handout on gastritis, GERD, hemorrhoids, and diverticulosis provided.  YOU HAD AN ENDOSCOPIC PROCEDURE TODAY AT THE [facility]:   Refer to the procedure report that was given to you for any specific questions about what was found during the examination.  If the procedure report does not answer your questions, please call your gastroenterologist to clarify.  If you requested that your care partner not be given the details of your procedure findings, then the procedure report has been included in a sealed envelope for you to review at your convenience later.  YOU SHOULD EXPECT: Some feelings of bloating in the abdomen. Passage of more gas than usual.  Walking can help get rid of the air that was put into your GI tract during the procedure and reduce the bloating. If you had a lower endoscopy (such as a colonoscopy or flexible sigmoidoscopy) you may notice spotting of blood in your stool or on the toilet paper. If you underwent a bowel prep for your procedure, you may not have a normal bowel movement for a few days.  Please Note:  You might notice some irritation and congestion in your nose or some drainage.  This is from the oxygen used during your procedure.  There is no need for concern and it should clear up in a day or so.  SYMPTOMS TO REPORT IMMEDIATELY:  Following lower endoscopy (colonoscopy or flexible sigmoidoscopy):  Excessive amounts of blood in the stool  Significant tenderness or worsening of abdominal pains  Swelling of the abdomen that is new, acute  Fever of 100F or higher  Following upper endoscopy (EGD)  Vomiting of blood or coffee ground material  New chest pain or pain under the shoulder blades  Painful or persistently difficult swallowing  New shortness of breath  Fever of 100F or higher  Black, tarry-looking  stools  For urgent or emergent issues, a gastroenterologist can be reached at any hour by calling [phone]. Do not use MyChart messaging for urgent concerns.    DIET:  We do recommend a small meal at first, but then you may proceed to your regular diet.  Drink plenty of fluids but you should avoid alcoholic beverages for 24 hours.  ACTIVITY:  You should plan to take it easy for the rest of today and you should NOT DRIVE or use heavy machinery until tomorrow (because of the sedation medicines used during the test).    FOLLOW UP: Our staff will call the number listed on your records the next business day following your procedure.  We will call around 7:15- 8:00 am to check on you and address any questions or concerns that you may have regarding the information given to you following your procedure. If we do not reach you, we will leave a message.     If any biopsies were taken you will be contacted by phone or by letter within the next 1-3 weeks.  Please call us at [phone] if you have not heard about the biopsies in 3 weeks.    SIGNATURES/CONFIDENTIALITY: You and/or your care partner have signed paperwork which will be entered into your electronic medical record.  These signatures attest to the fact that that the information above on your After Visit Summary has been reviewed and is understood.  Full responsibility of the confidentiality of this discharge information lies with you  and/or your care-partner.

## 2023-04-20 ENCOUNTER — Telehealth: Payer: Self-pay

## 2023-04-20 NOTE — Telephone Encounter (Signed)
  Follow up Call-     04/19/2023    7:38 AM  Call back number  Post procedure Call Back phone  # [phone]  Permission to leave phone message Yes     Patient questions:  Do you have a fever, pain , or abdominal swelling? No. Pain Score  0 *  Have you tolerated food without any problems? Yes.    Have you been able to return to your normal activities? Yes.    Do you have any questions about your discharge instructions: Diet   No. Medications  No. Follow up visit  No.  Do you have questions or concerns about your Care? No.  Actions: * If pain score is 4 or above: No action needed, pain <4.

## 2023-04-23 ENCOUNTER — Other Ambulatory Visit: Payer: Self-pay

## 2023-04-23 DIAGNOSIS — J351 Hypertrophy of tonsils: Secondary | ICD-10-CM

## 2023-04-23 DIAGNOSIS — D509 Iron deficiency anemia, unspecified: Secondary | ICD-10-CM

## 2023-04-23 LAB — SURGICAL PATHOLOGY

## 2023-05-11 ENCOUNTER — Other Ambulatory Visit: Payer: Self-pay | Admitting: Gastroenterology

## 2023-05-11 DIAGNOSIS — K2100 Gastro-esophageal reflux disease with esophagitis, without bleeding: Secondary | ICD-10-CM

## 2023-05-11 DIAGNOSIS — K2970 Gastritis, unspecified, without bleeding: Secondary | ICD-10-CM

## 2023-06-08 ENCOUNTER — Institutional Professional Consult (permissible substitution) (INDEPENDENT_AMBULATORY_CARE_PROVIDER_SITE_OTHER): Payer: BC Managed Care – PPO | Admitting: Otolaryngology

## 2023-08-08 ENCOUNTER — Telehealth: Payer: Self-pay

## 2023-08-08 NOTE — Telephone Encounter (Signed)
-----   Message from [facility] sent at 04/23/2023  3:07 PM EST ----- Regarding: February 2025 Pt needs to have CBC and iron panel in March per [facility].  Has patient seen ENT for enlarged tonsils?

## 2023-08-08 NOTE — Telephone Encounter (Signed)
 Called and spoke to patient.  He will go to the lab one day next week.  Labs orders are in Epic. He has not been to ENT. He missed his appointment bc he had the wrong date.  He knows he needs to call and reschedule that.  I encouraged him to call asap to get another appointment. Patient agreed to do so

## 2023-12-18 ENCOUNTER — Encounter: Payer: Self-pay | Admitting: Emergency Medicine

## 2023-12-18 ENCOUNTER — Ambulatory Visit (INDEPENDENT_AMBULATORY_CARE_PROVIDER_SITE_OTHER): Admitting: Emergency Medicine

## 2023-12-18 ENCOUNTER — Ambulatory Visit: Payer: Self-pay | Admitting: Emergency Medicine

## 2023-12-18 VITALS — BP 120/80 | HR 65 | Temp 97.8°F | Ht 67.0 in | Wt 214.0 lb

## 2023-12-18 DIAGNOSIS — B349 Viral infection, unspecified: Secondary | ICD-10-CM | POA: Diagnosis not present

## 2023-12-18 LAB — COMPREHENSIVE METABOLIC PANEL WITH GFR
ALT: 24 U/L (ref 0–53)
AST: 20 U/L (ref 0–37)
Albumin: 4.3 g/dL (ref 3.5–5.2)
Alkaline Phosphatase: 57 U/L (ref 39–117)
BUN: 13 mg/dL (ref 6–23)
CO2: 30 meq/L (ref 19–32)
Calcium: 9.2 mg/dL (ref 8.4–10.5)
Chloride: 103 meq/L (ref 96–112)
Creatinine, Ser: 1.03 mg/dL (ref 0.40–1.50)
GFR: 92.9 mL/min (ref >60.00)
Glucose, Bld: 87 mg/dL (ref 70–99)
Potassium: 4 meq/L (ref 3.5–5.1)
Sodium: 138 meq/L (ref 135–145)
Total Bilirubin: 0.3 mg/dL (ref 0.2–1.2)
Total Protein: 7.5 g/dL (ref 6.0–8.3)

## 2023-12-18 LAB — CBC WITH DIFFERENTIAL/PLATELET
Basophils Absolute: 0.1 K/uL (ref 0.0–0.1)
Basophils Relative: 0.6 % (ref 0.0–3.0)
Eosinophils Absolute: 0.2 K/uL (ref 0.0–0.7)
Eosinophils Relative: 1.8 % (ref 0.0–5.0)
HCT: 40.7 % (ref 39.0–52.0)
Hemoglobin: 13.4 g/dL (ref 13.0–17.0)
Lymphocytes Relative: 35.8 % (ref 12.0–46.0)
Lymphs Abs: 3.2 K/uL (ref 0.7–4.0)
MCHC: 33 g/dL (ref 30.0–36.0)
MCV: 72.5 fl — ABNORMAL LOW (ref 78.0–100.0)
Monocytes Absolute: 0.6 K/uL (ref 0.1–1.0)
Monocytes Relative: 7.2 % (ref 3.0–12.0)
Neutro Abs: 4.8 K/uL (ref 1.4–7.7)
Neutrophils Relative %: 54.6 % (ref 43.0–77.0)
Platelets: 344 K/uL (ref 150.0–400.0)
RBC: 5.61 Mil/uL (ref 4.22–5.81)
RDW: 14.4 % (ref 11.5–15.5)
WBC: 8.9 K/uL (ref 4.0–10.5)

## 2023-12-18 LAB — POC COVID19 BINAXNOW: SARS Coronavirus 2 Ag: NEGATIVE

## 2023-12-18 NOTE — Progress Notes (Signed)
 Darren Ramirez 38 y.o.   Chief Complaint  Patient presents with   Sinusitis    Patient states started Saturday. He has pressure at the back of his head but no headache. Some congestion, no head aches, no fever, no cough, No other symptoms. Has been taking only Tylenol  and Excedrin helps for a bit      HISTORY OF PRESENT ILLNESS: This is a 38 y.o. male complaining of pressure to the back of his head and neck that started last Saturday night after spending significant time working outdoors the same day Had mild cough but no other associated symptoms. No other complaints or medical concerns today.  Sinusitis Associated symptoms include headaches. Pertinent negatives include no chills, congestion, coughing, shortness of breath or sore throat.     Prior to Admission medications   Medication Sig Start Date End Date Taking? Authorizing Provider  acetaminophen  (TYLENOL ) 500 MG tablet Take 500 mg by mouth 2 (two) times daily as needed for moderate pain.   Yes [provider]  cetirizine (ZYRTEC) 10 MG tablet Take 10 mg by mouth daily as needed for allergies.   Yes [provider]  clobetasol (TEMOVATE) 0.05 % GEL Apply 1 Application topically as needed.   Yes [provider]  ibuprofen  (ADVIL ) 800 MG tablet Take 800 mg by mouth 2 (two) times daily as needed for moderate pain.   Yes [provider]  inFLIXimab  (REMICADE ) 100 MG injection Inject into the skin every 6 (six) weeks.   Yes [provider]  ferrous sulfate  325 (65 FE) MG tablet Take 1 tablet (325 mg total) by mouth daily with breakfast. Patient not taking: Reported on 12/18/2023 10/05/22   Amin, Sumayya, MD  flurbiprofen (ANSAID) 100 MG tablet Take 100 mg by mouth 2 (two) times daily. Patient not taking: Reported on 12/18/2023 09/25/22   [provider]  pantoprazole  (PROTONIX ) 40 MG tablet Take 1 tablet (40 mg total) by mouth daily. Patient not taking: Reported on 12/18/2023  05/11/23   San Sandor GAILS, DO    Allergies  Allergen Reactions   Amoxicillin  Other (See Comments)    Mouth sores/thrush     Patient Active Problem List   Diagnosis Date Noted   Microcytic hypochromic anemia 09/30/2022   Obesity (BMI 30-39.9) 09/30/2022   Seizure disorder (HCC) 03/08/2021   HYPERCHOLESTEROLEMIA 11/20/2008   Psoriatic arthritis (HCC) 11/20/2008    Past Medical History:  Diagnosis Date   Anemia    Arthralgia    Seasonal allergies    Tinea pedis     Past Surgical History:  Procedure Laterality Date   TEE WITHOUT CARDIOVERSION N/A 10/04/2022   Procedure: TRANSESOPHAGEAL ECHOCARDIOGRAM;  Surgeon: Francyne Headland, MD;  Location: [facility];  Service: Cardiovascular;  Laterality: N/A;    Social History   Socioeconomic History   Marital status: Single    Spouse name: Not on file   Number of children: Not on file   Years of education: Not on file   Highest education level: Not on file  Occupational History   Occupation: Holiday representative  Tobacco Use   Smoking status: Former    Types: Cigars    Quit date: 06/05/2010    Years since quitting: 13.5   Smokeless tobacco: Never  Substance and Sexual Activity   Alcohol use: Yes    Comment: 1-2 times per month   Drug use: No   Sexual activity: Not on file  Other Topics Concern   Not on file  Social  History Narrative   Not on file   Social Drivers of Health   Financial Resource Strain: Not on file  Food Insecurity: No Food Insecurity (10/01/2022)   Hunger Vital Sign    Worried About Running Out of Food in the Last Year: Never true    Ran Out of Food in the Last Year: Never true  Transportation Needs: No Transportation Needs (10/01/2022)   PRAPARE - Administrator, Civil Service (Medical): No    Lack of Transportation (Non-Medical): No  Physical Activity: Not on file  Stress: Not on file  Social Connections: Unknown (10/08/2021)   Received from [facility]   Social Network    Social  Network: Not on file  Intimate Partner Violence: Not At Risk (10/01/2022)   Humiliation, Afraid, Rape, and Kick questionnaire    Fear of Current or Ex-Partner: No    Emotionally Abused: No    Physically Abused: No    Sexually Abused: No    Family History  Problem Relation Age of Onset   Colon polyps Mother    Colon polyps Father    Hyperlipidemia Father    Sleep apnea Father    Thyroid  disease Father    Colon cancer Neg Hx    Esophageal cancer Neg Hx    Rectal cancer Neg Hx    Stomach cancer Neg Hx      Review of Systems  Constitutional: Negative.  Negative for chills and fever.  HENT:  Negative for congestion and sore throat.   Respiratory: Negative.  Negative for cough and shortness of breath.   Cardiovascular: Negative.  Negative for chest pain and palpitations.  Gastrointestinal:  Negative for abdominal pain, diarrhea, nausea and vomiting.  Genitourinary: Negative.  Negative for dysuria and hematuria.  Skin: Negative.  Negative for rash.  Neurological:  Positive for headaches.  All other systems reviewed and are negative.   Vitals:   12/18/23 1011  BP: 120/80  Pulse: 65  Temp: 97.8 F (36.6 C)  SpO2: 97%    Physical Exam Vitals reviewed.  Constitutional:      Appearance: Normal appearance.  HENT:     Head: Normocephalic.     Right Ear: Tympanic membrane, ear canal and external ear normal.     Left Ear: Tympanic membrane, ear canal and external ear normal.     Mouth/Throat:     Mouth: Mucous membranes are moist.     Pharynx: Oropharynx is clear.  Eyes:     Extraocular Movements: Extraocular movements intact.     Conjunctiva/sclera: Conjunctivae normal.     Pupils: Pupils are equal, round, and reactive to light.  Cardiovascular:     Rate and Rhythm: Normal rate and regular rhythm.     Pulses: Normal pulses.     Heart sounds: Normal heart sounds.  Pulmonary:     Effort: Pulmonary effort is normal.     Breath sounds: Normal breath sounds.  Abdominal:      Palpations: Abdomen is soft.     Tenderness: There is no abdominal tenderness.  Musculoskeletal:     Cervical back: No tenderness.  Lymphadenopathy:     Cervical: No cervical adenopathy.  Skin:    General: Skin is warm and dry.     Capillary Refill: Capillary refill takes less than 2 seconds.  Neurological:     General: No focal deficit present.     Mental Status: He is alert and oriented to person, place, and time.  Psychiatric:  Mood and Affect: Mood normal.        Behavior: Behavior normal.    Results for orders placed or performed in visit on 12/18/23 (from the past 24 hours)  POC COVID-19     Status: None   Collection Time: 12/18/23 11:04 AM  Result Value Ref Range   SARS Coronavirus 2 Ag Negative Negative     ASSESSMENT & PLAN: A total of 32 minutes was spent with the patient and counseling/coordination of care regarding preparing for this visit, review of most recent office visit notes, review of any chronic medical conditions under management, review of all medications, differential diagnosis of symptoms including possibility of viral illness, prognosis, documentation, and need for follow-up if no better or worse during the next several days.  Problem List Items Addressed This Visit       Other   Viral illness - Primary   Clinically stable.  No red flag signs or symptoms. Differential diagnosis discussed. Prolonged heat exposure contributing to symptoms Slowly improving.  Better today than yesterday Recommend blood work today No other concerns. Advised to contact the office if no better or worse during the next several days.      Relevant Orders   Comprehensive metabolic panel with GFR   CBC with Differential/Platelet   POC COVID-19 (Completed)   Patient Instructions  Viral Illness, Adult Viruses are tiny germs that can get into a person's body and cause illness. There are many different types of viruses. And they cause many types of illness. Viral  illnesses can range from mild to severe. They can affect various parts of the body. Short-term conditions that are caused by a virus include colds and flu (influenza) and stomach viruses. Long-term conditions that are caused by a virus include herpes, shingles, and human immunodeficiency virus (HIV) infection. A few viruses have been linked to certain cancers. What are the causes? Many types of viruses can cause illness. Viruses get into cells in your body, multiply, and cause the infected cells to work differently or die. When these cells die, they release more of the virus. When this happens, you get symptoms of the illness and the virus spreads to other cells. If the virus takes over how the cell works, it can cause the cell to divide and grow out of control. This happens when a virus causes cancer. Different viruses get into the body in different ways. You can get a virus by: Swallowing food or water that has come in contact with the virus. Breathing in droplets that have been coughed or sneezed into the air by an infected person. Touching a surface that has the virus on it and then touching your eyes, nose, or mouth. Being bitten by an insect or animal that carries the virus. Having sexual contact with a person who is infected with the virus. Being exposed to blood or fluids that contain the virus, either through an open cut or during a transfusion. If a virus enters your body, your body's disease-fighting system (immune system) will try to fight the virus. You may be at higher risk for a viral illness if your immune system is weak. What are the signs or symptoms? Symptoms depend on the type of virus and the location of the cells that it gets into. Symptoms can include: For cold and flu viruses: Fever. Headache. Sore throat. Muscle aches. Stuffy nose (nasal congestion). Cough. For stomach (gastrointestinal) viruses: Fever. Pain in the abdomen. Nausea or vomiting. Diarrhea. For liver  viruses (hepatitis): Loss  of appetite. Feeling tired. Skin or the white parts of your eyes turning yellow (jaundice). For brain and spinal cord viruses: Fever. Headache. Stiff neck. Nausea and vomiting. Confusion or being sleepy. For skin viruses: Warts. Itching. Rash. For sexually transmitted viruses: Discharge. Swelling. Redness. Rash. How is this diagnosed? This condition may be diagnosed based on one or more of these: Your symptoms and medical history. A physical exam. Tests, such as: Blood tests. Tests on a sample of mucus from your lungs (sputum sample). Tests on a poop (stool) sample. Tests on a swab of body fluids or a skin sore (lesion). How is this treated? Viruses can be hard to treat because they live within cells. Antibiotics do not treat viruses because these medicines do not get inside cells. Treatment for a viral illness may include: Resting and drinking a lot of fluids. Medicines to treat symptoms. These can include over-the-counter medicine for pain and fever, medicines for cough or congestion, and medicines for diarrhea. Antiviral medicines. These medicines are available only for certain types of viruses. Some viral illnesses can be prevented with vaccinations. A common example is the flu shot. Follow these instructions at home: Medicines Take over-the-counter and prescription medicines only as told by your health care provider. If you were prescribed an antiviral medicine, take it as told by your provider. Do not stop taking the antiviral even if you start to feel better. Know when antibiotics are needed and when they are not needed. Antibiotics do not treat viruses. You may get an antibiotic if your provider thinks that you may have, or are at risk for, a bacterial infection and you have a viral infection. Do not ask for an antibiotic prescription if you have been diagnosed with a viral illness. Antibiotics will not make your illness go away faster. Taking  antibiotics when they are not needed can lead to antibiotic resistance. When this develops, the medicine no longer works against the bacteria that it normally fights. General instructions Drink enough fluids to keep your pee (urine) pale yellow. Rest as much as possible. Return to your normal activities as told by your provider. Ask your provider what activities are safe for you. How is this prevented? To lower your risk of getting another viral illness: Wash your hands often with soap and water for at least 20 seconds. If soap and water are not available, use hand sanitizer. Avoid touching your nose, eyes, and mouth, especially if you have not washed your hands recently. If anyone in your household has a viral infection, clean all household surfaces that may have been in contact with the virus. Use soap and hot water. You may also use a commercially prepared, bleach-containing solution. Stay away from people who are sick with symptoms of a viral infection. Do not share items such as toothbrushes and water bottles with other people. Keep your vaccinations up to date. This includes getting a yearly flu shot. Eat a healthy diet and get plenty of rest. Contact a health care provider if: You have symptoms of a viral illness that do not go away. Your symptoms come back after going away. Your symptoms get worse. Get help right away if: You have trouble breathing. You have a severe headache or a stiff neck. You have severe vomiting or pain in your abdomen. These symptoms may be an emergency. Get help right away. Call 911. Do not wait to see if the symptoms will go away. Do not drive yourself to the hospital. This information is not intended  to replace advice given to you by your health care provider. Make sure you discuss any questions you have with your health care provider. Document Revised: 06/07/2022 Document Reviewed: 03/22/2022 Elsevier Patient Education  2024 [employer]   Emil Schaumann, MD [facility]

## 2023-12-18 NOTE — Assessment & Plan Note (Signed)
 Clinically stable.  No red flag signs or symptoms. Differential diagnosis discussed. Prolonged heat exposure contributing to symptoms Slowly improving.  Better today than yesterday Recommend blood work today No other concerns. Advised to contact the office if no better or worse during the next several days.

## 2023-12-18 NOTE — Patient Instructions (Signed)
 Viral Illness, Adult Viruses are tiny germs that can get into a person's body and cause illness. There are many different types of viruses. And they cause many types of illness. Viral illnesses can range from mild to severe. They can affect various parts of the body. Short-term conditions that are caused by a virus include colds and flu (influenza) and stomach viruses. Long-term conditions that are caused by a virus include herpes, shingles, and human immunodeficiency virus (HIV) infection. A few viruses have been linked to certain cancers. What are the causes? Many types of viruses can cause illness. Viruses get into cells in your body, multiply, and cause the infected cells to work differently or die. When these cells die, they release more of the virus. When this happens, you get symptoms of the illness and the virus spreads to other cells. If the virus takes over how the cell works, it can cause the cell to divide and grow out of control. This happens when a virus causes cancer. Different viruses get into the body in different ways. You can get a virus by: Swallowing food or water that has come in contact with the virus. Breathing in droplets that have been coughed or sneezed into the air by an infected person. Touching a surface that has the virus on it and then touching your eyes, nose, or mouth. Being bitten by an insect or animal that carries the virus. Having sexual contact with a person who is infected with the virus. Being exposed to blood or fluids that contain the virus, either through an open cut or during a transfusion. If a virus enters your body, your body's disease-fighting system (immune system) will try to fight the virus. You may be at higher risk for a viral illness if your immune system is weak. What are the signs or symptoms? Symptoms depend on the type of virus and the location of the cells that it gets into. Symptoms can include: For cold and flu  viruses: Fever. Headache. Sore throat. Muscle aches. Stuffy nose (nasal congestion). Cough. For stomach (gastrointestinal) viruses: Fever. Pain in the abdomen. Nausea or vomiting. Diarrhea. For liver viruses (hepatitis): Loss of appetite. Feeling tired. Skin or the white parts of your eyes turning yellow (jaundice). For brain and spinal cord viruses: Fever. Headache. Stiff neck. Nausea and vomiting. Confusion or being sleepy. For skin viruses: Warts. Itching. Rash. For sexually transmitted viruses: Discharge. Swelling. Redness. Rash. How is this diagnosed? This condition may be diagnosed based on one or more of these: Your symptoms and medical history. A physical exam. Tests, such as: Blood tests. Tests on a sample of mucus from your lungs (sputum sample). Tests on a poop (stool) sample. Tests on a swab of body fluids or a skin sore (lesion). How is this treated? Viruses can be hard to treat because they live within cells. Antibiotics do not treat viruses because these medicines do not get inside cells. Treatment for a viral illness may include: Resting and drinking a lot of fluids. Medicines to treat symptoms. These can include over-the-counter medicine for pain and fever, medicines for cough or congestion, and medicines for diarrhea. Antiviral medicines. These medicines are available only for certain types of viruses. Some viral illnesses can be prevented with vaccinations. A common example is the flu shot. Follow these instructions at home: Medicines Take over-the-counter and prescription medicines only as told by your health care provider. If you were prescribed an antiviral medicine, take it as told by your provider. Do not stop  taking the antiviral even if you start to feel better. Know when antibiotics are needed and when they are not needed. Antibiotics do not treat viruses. You may get an antibiotic if your provider thinks that you may have, or are at risk  for, a bacterial infection and you have a viral infection. Do not ask for an antibiotic prescription if you have been diagnosed with a viral illness. Antibiotics will not make your illness go away faster. Taking antibiotics when they are not needed can lead to antibiotic resistance. When this develops, the medicine no longer works against the bacteria that it normally fights. General instructions Drink enough fluids to keep your pee (urine) pale yellow. Rest as much as possible. Return to your normal activities as told by your provider. Ask your provider what activities are safe for you. How is this prevented? To lower your risk of getting another viral illness: Wash your hands often with soap and water for at least 20 seconds. If soap and water are not available, use hand sanitizer. Avoid touching your nose, eyes, and mouth, especially if you have not washed your hands recently. If anyone in your household has a viral infection, clean all household surfaces that may have been in contact with the virus. Use soap and hot water. You may also use a commercially prepared, bleach-containing solution. Stay away from people who are sick with symptoms of a viral infection. Do not share items such as toothbrushes and water bottles with other people. Keep your vaccinations up to date. This includes getting a yearly flu shot. Eat a healthy diet and get plenty of rest. Contact a health care provider if: You have symptoms of a viral illness that do not go away. Your symptoms come back after going away. Your symptoms get worse. Get help right away if: You have trouble breathing. You have a severe headache or a stiff neck. You have severe vomiting or pain in your abdomen. These symptoms may be an emergency. Get help right away. Call 911. Do not wait to see if the symptoms will go away. Do not drive yourself to the hospital. This information is not intended to replace advice given to you by your health  care provider. Make sure you discuss any questions you have with your health care provider. Document Revised: 06/07/2022 Document Reviewed: 03/22/2022 Elsevier Patient Education  2024 [employer].

## 2024-06-18 ENCOUNTER — Encounter (HOSPITAL_BASED_OUTPATIENT_CLINIC_OR_DEPARTMENT_OTHER): Payer: Self-pay

## 2024-06-18 ENCOUNTER — Emergency Department (HOSPITAL_BASED_OUTPATIENT_CLINIC_OR_DEPARTMENT_OTHER)
Admission: EM | Admit: 2024-06-18 | Discharge: 2024-06-18 | Disposition: A | Discharge status: Home / Self Care | Attending: Emergency Medicine | Admitting: Emergency Medicine

## 2024-06-18 ENCOUNTER — Other Ambulatory Visit: Payer: Self-pay

## 2024-06-18 DIAGNOSIS — Z79899 Other long term (current) drug therapy: Secondary | ICD-10-CM | POA: Insufficient documentation

## 2024-06-18 DIAGNOSIS — R6889 Other general symptoms and signs: Secondary | ICD-10-CM

## 2024-06-18 DIAGNOSIS — J111 Influenza due to unidentified influenza virus with other respiratory manifestations: Secondary | ICD-10-CM | POA: Insufficient documentation

## 2024-06-18 DIAGNOSIS — R059 Cough, unspecified: Secondary | ICD-10-CM | POA: Diagnosis present

## 2024-06-18 LAB — RESP PANEL BY RT-PCR (RSV, FLU A&B, COVID)  RVPGX2
Influenza A by PCR: POSITIVE — AB
Influenza B by PCR: NEGATIVE
Resp Syncytial Virus by PCR: NEGATIVE
SARS Coronavirus 2 by RT PCR: NEGATIVE

## 2024-06-18 MED ORDER — OSELTAMIVIR PHOSPHATE 75 MG PO CAPS: 75.0000 mg | ORAL_CAPSULE | Freq: Two times a day (BID) | ORAL | 0 refills | Status: AC

## 2024-06-18 NOTE — ED Triage Notes (Signed)
 Increased fatigue, headache, fever, bodyaches, chills that started x2 days ago, worsening. Requesting flu test.

## 2024-06-18 NOTE — ED Provider Notes (Signed)
 " [place] EMERGENCY DEPARTMENT AT [facility] Provider Note   CSN: [id number] Arrival date & time: 06/18/24  9073     Patient presents with: Flu-Like Sx   Darren Ramirez is a 38 y.o. male.   The history is provided by the patient.  Influenza Presenting symptoms: cough, diarrhea, fatigue, fever and headache   Severity:  Moderate Onset quality:  Sudden Duration:  2 days Progression:  Improving Chronicity:  New Relieved by:  None tried Worsened by:  Nothing Ineffective treatments:  None tried Associated symptoms: chills, decreased appetite and decreased physical activity   Risk factors: immunocompromised state and sick contacts        Prior to Admission medications  Medication Sig Start Date End Date Taking? Authorizing Provider  acetaminophen  (TYLENOL ) 500 MG tablet Take 500 mg by mouth 2 (two) times daily as needed for moderate pain.    [provider]  cetirizine (ZYRTEC) 10 MG tablet Take 10 mg by mouth daily as needed for allergies.    [provider]  clobetasol (TEMOVATE) 0.05 % GEL Apply 1 Application topically as needed.    [provider]  ferrous sulfate  325 (65 FE) MG tablet Take 1 tablet (325 mg total) by mouth daily with breakfast. Patient not taking: Reported on 12/18/2023 10/05/22   Amin, Sumayya, MD  flurbiprofen (ANSAID) 100 MG tablet Take 100 mg by mouth 2 (two) times daily. Patient not taking: Reported on 12/18/2023 09/25/22   [provider]  ibuprofen  (ADVIL ) 800 MG tablet Take 800 mg by mouth 2 (two) times daily as needed for moderate pain.    [provider]  inFLIXimab  (REMICADE ) 100 MG injection Inject into the skin every 6 (six) weeks.    [provider]  pantoprazole  (PROTONIX ) 40 MG tablet Take 1 tablet (40 mg total) by mouth daily. Patient not taking: Reported on 12/18/2023 05/11/23   Cirigliano, Vito V, DO    Allergies: Amoxicillin     Review of Systems  Constitutional:  Positive  for chills, decreased appetite, fatigue and fever.  Respiratory:  Positive for cough.   Gastrointestinal:  Positive for diarrhea.  Neurological:  Positive for headaches.    Updated Vital Signs BP 138/81 (BP Location: Left Arm)   Pulse 77   Temp 98.9 F (37.2 C)   Resp 15   SpO2 98%   Physical Exam Vitals and nursing note reviewed.  Constitutional:      General: He is not in acute distress.    Appearance: He is well-developed. He is ill-appearing. He is not diaphoretic.  HENT:     Head: Normocephalic and atraumatic.     Mouth/Throat:     Pharynx: No oropharyngeal exudate or posterior oropharyngeal erythema.  Eyes:     General: No scleral icterus.    Conjunctiva/sclera: Conjunctivae normal.  Cardiovascular:     Rate and Rhythm: Normal rate and regular rhythm.     Heart sounds: Normal heart sounds.  Pulmonary:     Effort: Pulmonary effort is normal. No respiratory distress.     Breath sounds: Normal breath sounds.  Abdominal:     Palpations: Abdomen is soft.     Tenderness: There is no abdominal tenderness.  Musculoskeletal:        General: Normal range of motion.     Cervical back: Normal range of motion and neck supple.  Skin:    General: Skin is warm and dry.  Neurological:     General: No focal deficit present.  Mental Status: He is alert and oriented to person, place, and time.  Psychiatric:        Behavior: Behavior normal.     (all labs ordered are listed, but only abnormal results are displayed) Labs Reviewed  RESP PANEL BY RT-PCR (RSV, FLU A&B, COVID)  [id number]    EKG: None  Radiology: No results found.   Procedures   Medications Ordered in the ED - No data to display                                  Medical Decision Making  Patient here with flu like symptoms.  He is immunocompromised on Skyrizi for treatment of psoriasis. Patient exposed to recent sick contact in the home.  In shared decision making patient has agreed to go home and wait  for his MyChart results for influenza testing.  He is given a handwritten prescription for Tamiflu  as he is in the window for treatment and immunocompromise and a good candidate.  He is afebrile here with normal vital signs no difficulty breathing ill but well-appearing appropriate for discharge at this time with strict return precautions and supportive care.     Final diagnoses:  None    ED Discharge Orders     None          Arloa Chroman, PA-C 06/18/24 1235    Armenta Canning, MD 06/19/24 [phone]  "

## 2024-06-18 NOTE — Discharge Instructions (Signed)
 ### Influenza A: Home Care and Treatment Information     ## What is Influenza A?        Influenza A is a viral infection that affects your respiratory system (nose, throat, and lungs). Common symptoms include fever, cough, sore throat, body aches, headache, chills, and fatigue. Most people recover within 1 week, though cough and tiredness can last longer.[1][2]      ## Fever and Pain Management        You can manage fever and discomfort with over-the-counter medications:      **Acetaminophen  (Tylenol ):**      - Take [phone] mg every 4-6 hours as needed      - Do not exceed 3000-4000 mg in 24 hours      **Ibuprofen  (Advil , Motrin ):**      - Take 400-600 mg every 6 hours as needed      - Do not exceed 2400 mg in 24 hours      **Alternating Schedule (if recommended by your doctor):**      You may alternate between acetaminophen  and ibuprofen  every 3 hours to help control fever and discomfort. For example:      - 8:00 AM: Acetaminophen  650 mg      - 11:00 AM: Ibuprofen  400 mg      - 2:00 PM: Acetaminophen  650 mg      - 5:00 PM: Ibuprofen  400 mg      Both medications are equally effective and safe for treating fever and pain.[3][4] Keep a written log of when you take each medication to avoid confusion or accidental overdose.      ## Why Antibiotics Won't Help        **Viral vs. Bacterial Infections:**      Influenza is caused by a **virus**, not bacteria. Understanding this difference is important:      - **Viral infections** like influenza are caused by viruses that invade your cells and use them to reproduce. Viruses have different structures and mechanisms than bacteria.[5]      - **Bacterial infections** are caused by bacteria, which are different types of organisms with their own cell walls and biological processes.      **Why antibiotics don't work for influenza:**      Antibiotics are designed to kill bacteria by targeting specific bacterial structures (like  cell walls) or processes (like bacterial DNA replication) that viruses don't have.[5][6] Taking antibiotics for a viral infection like influenza:      - Will not help you feel better or recover faster      - Will not prevent complications      - Can cause side effects like nausea, diarrhea, or allergic reactions      - Contributes to antibiotic resistance, making antibiotics less effective when you truly need them[7][6]      Only 0.5-2% of viral respiratory infections develop secondary bacterial complications that would require antibiotics.[6] Your doctor will prescribe antibiotics only if you develop a bacterial infection as a complication of influenza.      ## Home Supportive Care        **Rest and Recovery:**      - Get plenty of rest to help your body fight the infection      - Stay home from work or school to avoid spreading the virus to others      - Expect to feel tired for 1-2 weeks, even after other symptoms improve[1][2]      **Hydration:**      -  Drink plenty of fluids (water, warm tea, broth, electrolyte drinks)      - Avoid alcohol and caffeine, which can cause dehydration      - Adequate hydration helps thin mucus and prevents dehydration from fever      **Comfort Measures:**      - Use a humidifier or breathe steam from a hot shower to ease congestion      - Gargle with warm salt water for sore throat      - Use saline nasal spray or rinses for nasal congestion      - Keep your room at a comfortable temperature      **Nutrition:**      - Eat when you feel able, focusing on easy-to-digest foods      - Don't force yourself to eat if you have no appetite, but maintain fluid intake      ## When to Seek Medical Attention        Contact your doctor or seek emergency care if you experience:[8][1][9]      - **Difficulty breathing or shortness of breath**      - **Chest pain or pressure**      - **Confusion or difficulty waking up**      - **Severe or  persistent vomiting**      - **Dizziness or inability to stand**      - **Seizures**      - **Severe muscle pain or weakness**      - **Fever that returns after improving**      - **Worsening of chronic medical conditions (heart disease, asthma, diabetes)**      - **Symptoms that improve but then return with fever and worse cough**      ## Antiviral Medications        If you are at high risk for complications or have severe symptoms, your doctor may prescribe antiviral medications like oseltamivir  (Tamiflu ). These work best when started within 48 hours of symptom onset and can shorten illness duration by about 1 day and reduce complications.[1][10] Unlike antibiotics, antivirals are designed specifically to fight viruses.      ## Preventing Spread to Others        - Wash your hands frequently with soap and water      - Cover coughs and sneezes with a tissue or your elbow      - Avoid close contact with others, especially those at high risk      - Stay home until fever-free for at least 24 hours without fever-reducing medication      - Clean frequently touched surfaces regularly      ### References  1. Influenza. Park MAPLES, Hui DS, Zambon M, Wentworth DE, Monto AS. Lancet Vick, [place]). 2022;400(10353):693-706. doi:10.1016/S0140-6736(22)00982-5. 2. Influenza: Diagnosis and Treatment. Gaitonde DY, Moore FC, [facility]. American Family Physician. 2019;100(12):751-758. 3. Comparison of Intravenous Ibuprofen  and Paracetamol in the Treatment of Fever: A Randomized Double-Blind Study. Can , K?yan GS, Yal?nl? S. The American Journal of Emergency Medicine. 2021;46:102-106. doi:10.1016/j.ajem.2021.02.057. 4. Efficacy and Safety of Ibuprofen  and Acetaminophen  in Children and Adults: A Meta-Analysis and Qualitative Review. Lyle HAKIM, [place] B. The Annals of Pharmacotherapy. 2010;44(3):489-506. doi:10.1345/aph.1M332. 5. A Comprehensive Review of Antimicrobial Drugs: Mechanisms of Action  and Specific Targets in Microorganisms. Shekhawat D, Gouthami K, Santra A, et al. Journal of Basic Microbiology. 2025;:e70057. doi:10.1002/jobm.29942. 6. Antimicrobial Treatment Guidelines for Acute Bacterial Rhinosinusitis. Herma FOYE Cedar MR, Malcolm MD, et al. Otolaryngology--Head and Neck Surgery : Official Journal of American  Academy of Otolaryngology-Head and Neck Surgery. 2004;130(1 Suppl):1-45. doi:10.1016/j.otohns.2003.12.003. 7. Distinction Between Bacterial and Viral Infections. Nuutila J, Lilius EM. Current Opinion in Infectious Diseases. 2007;20(3):304-10. doi:10.1097/QCO.[id number] db4. 8. Clinical Practice Guidelines by the Infectious Diseases Society of America: 2018 Update on Diagnosis, Treatment, Chemoprophylaxis, and Institutional Outbreak Management of Seasonal Influenzaa. Park YEHUDA Jaeger HH, Adine PURCHASE, et al. Clinical Infectious Diseases : An Official Publication of the Infectious Diseases Society of America. 2019;68(6):e1-e47. doi:10.1093/cid/ciy866. 9. Influenza. Wanda Sires, Michael Daugherty, Eduardo Azziz-Baumgartner. CDC Yellow Book. 10. Influenza A(H3N2) Subclade K Virus. Zambon M, Hayden FG. JAMA. [id number]. doi:10.1001/jama.7974.74096.

## 2024-06-18 NOTE — ED Notes (Signed)
 Pt d/c instructions, medications, and follow-up care reviewed with pt. Pt verbalized understanding and had no further questions at time of d/c. Pt CA&Ox4, ambulatory, and in NAD at time of d/c

## 2024-06-23 ENCOUNTER — Encounter: Payer: Self-pay | Admitting: Emergency Medicine

## 2024-06-23 ENCOUNTER — Ambulatory Visit: Admitting: Emergency Medicine

## 2024-06-23 VITALS — BP 124/66 | HR 65 | Temp 97.9°F | Ht 67.0 in | Wt 218.0 lb

## 2024-06-23 DIAGNOSIS — J111 Influenza due to unidentified influenza virus with other respiratory manifestations: Secondary | ICD-10-CM | POA: Insufficient documentation

## 2024-06-23 NOTE — Patient Instructions (Signed)

## 2024-06-23 NOTE — Assessment & Plan Note (Signed)
 Clinically stable.  Running its course without complications. No red flag signs or symptoms Afebrile.  Clear chest. Advised to rest and stay well-hydrated Symptom management discussed ED precautions given Advised to contact the office if no better or worse during the next several days

## 2024-06-23 NOTE — Progress Notes (Signed)
 Darren Ramirez 39 y.o.   Chief Complaint  Patient presents with   Cough    Patient states that he has been having this cough for about a week and  at night time he can hear it rattling     HISTORY OF PRESENT ILLNESS: This is a 39 y.o. male diagnosed with influenza last week.  Here for follow-up. Feeling much better.  Still has occasional cough and some rattling in the chest. Overall doing better No other complaints or medical concerns  Cough Pertinent negatives include no chest pain, headaches or rash.     Prior to Admission medications  Medication Sig Start Date End Date Taking? Authorizing Provider  acetaminophen  (TYLENOL ) 500 MG tablet Take 500 mg by mouth 2 (two) times daily as needed for moderate pain.    [provider]  cetirizine (ZYRTEC) 10 MG tablet Take 10 mg by mouth daily as needed for allergies.    [provider]  clobetasol (TEMOVATE) 0.05 % GEL Apply 1 Application topically as needed.    [provider]  ferrous sulfate  325 (65 FE) MG tablet Take 1 tablet (325 mg total) by mouth daily with breakfast. Patient not taking: Reported on 12/18/2023 10/05/22   Amin, Sumayya, MD  flurbiprofen (ANSAID) 100 MG tablet Take 100 mg by mouth 2 (two) times daily. Patient not taking: Reported on 12/18/2023 09/25/22   [provider]  ibuprofen  (ADVIL ) 800 MG tablet Take 800 mg by mouth 2 (two) times daily as needed for moderate pain.    [provider]  inFLIXimab  (REMICADE ) 100 MG injection Inject into the skin every 6 (six) weeks.    [provider]  oseltamivir  (TAMIFLU ) 75 MG capsule Take 1 capsule (75 mg total) by mouth every 12 (twelve) hours. 06/18/24   Harris, Abigail, PA-C  pantoprazole  (PROTONIX ) 40 MG tablet Take 1 tablet (40 mg total) by mouth daily. Patient not taking: Reported on 12/18/2023 05/11/23   [place]or GAILS, DO    Allergies[1]  Patient Active Problem List   Diagnosis Date Noted   Viral illness  12/18/2023   Microcytic hypochromic anemia 09/30/2022   Obesity (BMI 30-39.9) 09/30/2022   Seizure disorder (HCC) 03/08/2021   HYPERCHOLESTEROLEMIA 11/20/2008   Psoriatic arthritis (HCC) 11/20/2008    Past Medical History:  Diagnosis Date   Anemia    Arthralgia    Seasonal allergies    Tinea pedis     Past Surgical History:  Procedure Laterality Date   TEE WITHOUT CARDIOVERSION N/A 10/04/2022   Procedure: TRANSESOPHAGEAL ECHOCARDIOGRAM;  Surgeon: Francyne Headland, MD;  Location: [facility];  Service: Cardiovascular;  Laterality: N/A;    Social History   Socioeconomic History   Marital status: Single    Spouse name: Not on file   Number of children: Not on file   Years of education: Not on file   Highest education level: Not on file  Occupational History   Occupation: [employer] Representative  Tobacco Use   Smoking status: Former    Types: Cigars    Quit date: 06/05/2010    Years since quitting: 14.0   Smokeless tobacco: Never  Substance and Sexual Activity   Alcohol use: Yes    Comment: 1-2 times per month   Drug use: No   Sexual activity: Not on file  Other Topics Concern   Not on file  Social History Narrative   Not on file   Social Drivers of Health   Tobacco Use: Medium Risk (06/23/2024)  Patient History    Smoking Tobacco Use: Former    Smokeless Tobacco Use: Never    Passive Exposure: Not on file  Financial Resource Strain: Not on file  Food Insecurity: No Food Insecurity (10/01/2022)   Hunger Vital Sign    Worried About Running Out of Food in the Last Year: Never true    Ran Out of Food in the Last Year: Never true  Transportation Needs: No Transportation Needs (10/01/2022)   PRAPARE - Administrator, Civil Service (Medical): No    Lack of Transportation (Non-Medical): No  Physical Activity: Not on file  Stress: Not on file  Social Connections: Not on file  Intimate Partner Violence: Not At Risk (10/01/2022)   Humiliation, Afraid, Rape, and  Kick questionnaire    Fear of Current or Ex-Partner: No    Emotionally Abused: No    Physically Abused: No    Sexually Abused: No  Depression (PHQ2-9): Low Risk (06/23/2024)   Depression (PHQ2-9)    PHQ-2 Score: 0  Alcohol Screen: Not on file  Housing: Low Risk (10/01/2022)   Housing    Last Housing Risk Score: 0  Utilities: Not At Risk (10/01/2022)   [facility] Utilities    Threatened with loss of utilities: No  Health Literacy: Not on file    Family History  Problem Relation Age of Onset   Colon polyps Mother    Colon polyps Father    Hyperlipidemia Father    Sleep apnea Father    Thyroid  disease Father    Colon cancer Neg Hx    Esophageal cancer Neg Hx    Rectal cancer Neg Hx    Stomach cancer Neg Hx      Review of Systems  Constitutional: Negative.   HENT:  Positive for congestion.   Respiratory:  Positive for cough.   Cardiovascular: Negative.  Negative for chest pain and palpitations.  Gastrointestinal:  Negative for abdominal pain, diarrhea, nausea and vomiting.  Genitourinary: Negative.  Negative for dysuria and hematuria.  Skin: Negative.  Negative for rash.  Neurological: Negative.  Negative for dizziness and headaches.  All other systems reviewed and are negative.   Vitals:   06/23/24 1426  BP: 124/66  Pulse: 65  Temp: 97.9 F (36.6 C)  SpO2: 96%    Physical Exam Vitals reviewed.  Constitutional:      Appearance: Normal appearance.  HENT:     Head: Normocephalic.     Mouth/Throat:     Mouth: Mucous membranes are moist.     Pharynx: Oropharynx is clear.  Eyes:     Extraocular Movements: Extraocular movements intact.     Conjunctiva/sclera: Conjunctivae normal.     Pupils: Pupils are equal, round, and reactive to light.  Cardiovascular:     Rate and Rhythm: Normal rate and regular rhythm.     Pulses: Normal pulses.     Heart sounds: Normal heart sounds.  Pulmonary:     Effort: Pulmonary effort is normal.     Breath sounds: Normal breath sounds.   Musculoskeletal:     Cervical back: No tenderness.  Lymphadenopathy:     Cervical: No cervical adenopathy.  Skin:    General: Skin is warm and dry.     Capillary Refill: Capillary refill takes less than 2 seconds.  Neurological:     General: No focal deficit present.     Mental Status: He is alert and oriented to person, place, and time.  Psychiatric:  Mood and Affect: Mood normal.        Behavior: Behavior normal.      ASSESSMENT & PLAN: I personally spent a total of 30 minutes minutes in the care of the patient today including preparing to see the patient, getting/reviewing separately obtained history, performing a medically appropriate exam/evaluation, counseling and educating, documenting clinical information in the EHR, coordinating care, and prognosis, ED precautions, and need for follow-up if no better or worse during the next several days.  Problem List Items Addressed This Visit       Respiratory   Influenza - Primary   Clinically stable.  Running its course without complications. No red flag signs or symptoms Afebrile.  Clear chest. Advised to rest and stay well-hydrated Symptom management discussed ED precautions given Advised to contact the office if no better or worse during the next several days      Patient Instructions  Influenza, Adult Influenza is also called the flu. It's an infection that affects your respiratory tract. This includes your nose, throat, windpipe, and lungs. The flu is contagious. This means it spreads easily from person to person. It causes symptoms that are like a cold. It can also cause a high fever and body aches. What are the causes? The flu is caused by the influenza virus. You can get it by: Breathing in droplets that are in the air after an infected person coughs or sneezes. Touching something that has the virus on it and then touching your mouth, nose, or eyes. What increases the risk? You may be more likely to get the  flu if: You don't wash your hands often. You're near a lot of people during cold and flu season. You touch your mouth, eyes, or nose without washing your hands first. You don't get a flu shot each year. You may also be more at risk for the flu and serious problems, such as a lung infection called pneumonia, if: You're older than 65. You're pregnant. Your immune system is weak. Your immune system is your body's defense system. You have a long-term, or chronic, condition, such as: Heart, kidney, or lung disease. Diabetes. A liver disorder. Asthma. You're very overweight. You have anemia. This is when you don't have enough red blood cells in your body. What are the signs or symptoms? Flu symptoms often start all of a sudden. They may last 4-14 days and include: Fever and chills. Headaches, body aches, or muscle aches. Sore throat. Cough. Runny or stuffy nose. Discomfort in your chest. Not wanting to eat as much as normal. Feeling weak or tired. Feeling dizzy. Nausea or vomiting. How is this diagnosed? The flu may be diagnosed based on your symptoms and medical history. You may also have a physical exam. A swab may be taken from your nose or throat and tested for the virus. How is this treated? If the flu is found early, you can be treated with antiviral medicine. This may be given to you by mouth or through an IV. It can help you feel less sick and get better faster. Taking care of yourself at home can also help your symptoms get better. Your health care provider may tell you to: Take over-the-counter medicines. Drink lots of fluids. The flu often goes away on its own. If you have very bad symptoms or problems caused by the flu, you may need to be treated in a hospital. Follow these instructions at home: Activity Rest as needed. Get lots of sleep. Stay home from work  or school as told by your provider. Leave home only to go see your provider. Do not leave home for other reasons  until you don't have a fever for 24 hours without taking medicine. Eating and drinking Take an oral rehydration solution (ORS). This is a drink that is sold at pharmacies and stores. Drink enough fluid to keep your pee pale yellow. Try to drink small amounts of clear fluids. These include water, ice chips, fruit juice mixed with water, and low-calorie sports drinks. Try to eat bland foods that are easy to digest. These include bananas, applesauce, rice, lean meats, toast, and crackers. Avoid drinks that have a lot of sugar or caffeine in them. These include energy drinks, regular sports drinks, and soda. Do not drink alcohol. Do not eat spicy or fatty foods. General instructions     Take your medicines only as told by your provider. Use a cool mist humidifier to add moisture to the air in your home. This can make it easier for you to breathe. You should also clean the humidifier every day. To do so: Empty the water. Pour clean water in. Cover your mouth and nose when you cough or sneeze. Wash your hands with soap and water often and for at least 20 seconds. It's extra important to do so after you cough or sneeze. If you can't use soap and water, use hand sanitizer. How is this prevented?  Get a flu shot every year. Ask your provider when you should get your flu shot. Stay away from people who are sick during fall and winter. Fall and winter are cold and flu season. Contact a health care provider if: You get new symptoms. You have chest pain. You have watery poop, also called diarrhea. You have a fever. Your cough gets worse. You start to have more mucus. You feel like you may vomit, or you vomit. Get help right away if: You become short of breath or have trouble breathing. Your skin or nails turn blue. You have very bad pain or stiffness in your neck. You get a sudden headache or pain in your face or ear. You vomit each time you eat or drink. These symptoms may be an emergency.  Call 911 right away. Do not wait to see if the symptoms will go away. Do not drive yourself to the hospital. This information is not intended to replace advice given to you by your health care provider. Make sure you discuss any questions you have with your health care provider. Document Revised: 02/22/2023 Document Reviewed: 06/29/2022 Elsevier Patient Education  2024 [employer]    Emil Schaumann, MD [facility]    [1]  Allergies Allergen Reactions   Amoxicillin  Other (See Comments)    Mouth sores/thrush
